# Patient Record
Sex: Female | Born: 1940 | Race: White | Hispanic: No | Marital: Married
Health system: Southern US, Community
[De-identification: ages and names within clinical notes are randomized; demographics above are authoritative.]

## PROBLEM LIST (undated history)

## (undated) DIAGNOSIS — I10 Essential (primary) hypertension: Secondary | ICD-10-CM

## (undated) DIAGNOSIS — G20 Parkinson's disease: Secondary | ICD-10-CM

## (undated) DIAGNOSIS — J449 Chronic obstructive pulmonary disease, unspecified: Secondary | ICD-10-CM

## (undated) DIAGNOSIS — E785 Hyperlipidemia, unspecified: Secondary | ICD-10-CM

## (undated) DIAGNOSIS — G20A1 Parkinson's disease without dyskinesia, without mention of fluctuations: Secondary | ICD-10-CM

## (undated) DIAGNOSIS — E119 Type 2 diabetes mellitus without complications: Secondary | ICD-10-CM

---

## 2021-09-20 ENCOUNTER — Emergency Department (HOSPITAL_COMMUNITY): Payer: Medicare Other

## 2021-09-20 ENCOUNTER — Observation Stay (HOSPITAL_COMMUNITY): Payer: Medicare Other

## 2021-09-20 ENCOUNTER — Inpatient Hospital Stay (HOSPITAL_COMMUNITY)
Admission: EM | Admit: 2021-09-20 | Discharge: 2021-09-23 | DRG: 917 | Payer: Medicare Other | Source: Skilled Nursing Facility | Attending: Internal Medicine | Admitting: Internal Medicine

## 2021-09-20 ENCOUNTER — Encounter (HOSPITAL_COMMUNITY): Payer: Self-pay | Admitting: Pharmacy Technician

## 2021-09-20 DIAGNOSIS — W19XXXD Unspecified fall, subsequent encounter: Secondary | ICD-10-CM | POA: Diagnosis present

## 2021-09-20 DIAGNOSIS — S92902S Unspecified fracture of left foot, sequela: Secondary | ICD-10-CM | POA: Diagnosis not present

## 2021-09-20 DIAGNOSIS — T428X1A Poisoning by antiparkinsonism drugs and other central muscle-tone depressants, accidental (unintentional), initial encounter: Principal | ICD-10-CM | POA: Diagnosis present

## 2021-09-20 DIAGNOSIS — J449 Chronic obstructive pulmonary disease, unspecified: Secondary | ICD-10-CM

## 2021-09-20 DIAGNOSIS — R400 Somnolence: Secondary | ICD-10-CM | POA: Diagnosis not present

## 2021-09-20 DIAGNOSIS — S92902D Unspecified fracture of left foot, subsequent encounter for fracture with routine healing: Secondary | ICD-10-CM

## 2021-09-20 DIAGNOSIS — I10 Essential (primary) hypertension: Secondary | ICD-10-CM | POA: Diagnosis not present

## 2021-09-20 DIAGNOSIS — I726 Aneurysm of vertebral artery: Secondary | ICD-10-CM | POA: Diagnosis not present

## 2021-09-20 DIAGNOSIS — G9340 Encephalopathy, unspecified: Secondary | ICD-10-CM | POA: Diagnosis present

## 2021-09-20 DIAGNOSIS — E119 Type 2 diabetes mellitus without complications: Secondary | ICD-10-CM

## 2021-09-20 DIAGNOSIS — G9341 Metabolic encephalopathy: Secondary | ICD-10-CM

## 2021-09-20 DIAGNOSIS — R4182 Altered mental status, unspecified: Secondary | ICD-10-CM | POA: Diagnosis not present

## 2021-09-20 DIAGNOSIS — F0280 Dementia in other diseases classified elsewhere without behavioral disturbance: Secondary | ICD-10-CM | POA: Diagnosis present

## 2021-09-20 DIAGNOSIS — R001 Bradycardia, unspecified: Secondary | ICD-10-CM | POA: Diagnosis present

## 2021-09-20 DIAGNOSIS — R8281 Pyuria: Secondary | ICD-10-CM | POA: Diagnosis present

## 2021-09-20 DIAGNOSIS — G928 Other toxic encephalopathy: Secondary | ICD-10-CM | POA: Diagnosis present

## 2021-09-20 DIAGNOSIS — G2581 Restless legs syndrome: Secondary | ICD-10-CM | POA: Diagnosis present

## 2021-09-20 DIAGNOSIS — E785 Hyperlipidemia, unspecified: Secondary | ICD-10-CM

## 2021-09-20 DIAGNOSIS — E876 Hypokalemia: Secondary | ICD-10-CM

## 2021-09-20 DIAGNOSIS — G20 Parkinson's disease: Secondary | ICD-10-CM | POA: Diagnosis present

## 2021-09-20 DIAGNOSIS — Z4659 Encounter for fitting and adjustment of other gastrointestinal appliance and device: Secondary | ICD-10-CM

## 2021-09-20 DIAGNOSIS — G20A1 Parkinson's disease without dyskinesia, without mention of fluctuations: Secondary | ICD-10-CM

## 2021-09-20 DIAGNOSIS — Z20822 Contact with and (suspected) exposure to covid-19: Secondary | ICD-10-CM | POA: Diagnosis present

## 2021-09-20 DIAGNOSIS — Z91013 Allergy to seafood: Secondary | ICD-10-CM

## 2021-09-20 DIAGNOSIS — Y92099 Unspecified place in other non-institutional residence as the place of occurrence of the external cause: Secondary | ICD-10-CM

## 2021-09-20 HISTORY — DX: Parkinson's disease: G20

## 2021-09-20 HISTORY — DX: Essential (primary) hypertension: I10

## 2021-09-20 HISTORY — DX: Chronic obstructive pulmonary disease, unspecified: J44.9

## 2021-09-20 HISTORY — DX: Parkinson's disease without dyskinesia, without mention of fluctuations: G20.A1

## 2021-09-20 HISTORY — DX: Type 2 diabetes mellitus without complications: E11.9

## 2021-09-20 HISTORY — DX: Hyperlipidemia, unspecified: E78.5

## 2021-09-20 LAB — CBC WITH DIFFERENTIAL/PLATELET
Abs Immature Granulocytes: 0.03 10*3/uL (ref 0.00–0.07)
Basophils Absolute: 0 10*3/uL (ref 0.0–0.1)
Basophils Relative: 0 %
Eosinophils Absolute: 0.1 10*3/uL (ref 0.0–0.5)
Eosinophils Relative: 1 %
HCT: 36.3 % (ref 36.0–46.0)
Hemoglobin: 12.8 g/dL (ref 12.0–15.0)
Immature Granulocytes: 1 %
Lymphocytes Relative: 34 %
Lymphs Abs: 2 10*3/uL (ref 0.7–4.0)
MCH: 32.7 pg (ref 26.0–34.0)
MCHC: 35.3 g/dL (ref 30.0–36.0)
MCV: 92.6 fL (ref 80.0–100.0)
Monocytes Absolute: 0.6 10*3/uL (ref 0.1–1.0)
Monocytes Relative: 9 %
Neutro Abs: 3.2 10*3/uL (ref 1.7–7.7)
Neutrophils Relative %: 55 %
Platelets: 235 10*3/uL (ref 150–400)
RBC: 3.92 MIL/uL (ref 3.87–5.11)
RDW: 12.8 % (ref 11.5–15.5)
WBC: 5.9 10*3/uL (ref 4.0–10.5)
nRBC: 0 % (ref 0.0–0.2)

## 2021-09-20 LAB — URINALYSIS, ROUTINE W REFLEX MICROSCOPIC
Bacteria, UA: NONE SEEN
Bilirubin Urine: NEGATIVE
Glucose, UA: NEGATIVE mg/dL
Ketones, ur: NEGATIVE mg/dL
Leukocytes,Ua: NEGATIVE
Nitrite: NEGATIVE
Protein, ur: NEGATIVE mg/dL
Specific Gravity, Urine: 1.014 (ref 1.005–1.030)
pH: 8 (ref 5.0–8.0)

## 2021-09-20 LAB — COMPREHENSIVE METABOLIC PANEL
ALT: 7 U/L (ref 0–44)
AST: 21 U/L (ref 15–41)
Albumin: 3.5 g/dL (ref 3.5–5.0)
Alkaline Phosphatase: 72 U/L (ref 38–126)
Anion gap: 9 (ref 5–15)
BUN: 5 mg/dL — ABNORMAL LOW (ref 8–23)
CO2: 33 mmol/L — ABNORMAL HIGH (ref 22–32)
Calcium: 9.9 mg/dL (ref 8.9–10.3)
Chloride: 100 mmol/L (ref 98–111)
Creatinine, Ser: 0.74 mg/dL (ref 0.44–1.00)
GFR, Estimated: >60 mL/min (ref >60)
Glucose, Bld: 115 mg/dL — ABNORMAL HIGH (ref 70–99)
Potassium: 2.8 mmol/L — ABNORMAL LOW (ref 3.5–5.1)
Sodium: 142 mmol/L (ref 135–145)
Total Bilirubin: 0.6 mg/dL (ref 0.3–1.2)
Total Protein: 6.4 g/dL — ABNORMAL LOW (ref 6.5–8.1)

## 2021-09-20 LAB — I-STAT VENOUS BLOOD GAS, ED
Acid-Base Excess: 9 mmol/L — ABNORMAL HIGH (ref 0.0–2.0)
Bicarbonate: 31.7 mmol/L — ABNORMAL HIGH (ref 20.0–28.0)
Calcium, Ion: 1.15 mmol/L (ref 1.15–1.40)
HCT: 30 % — ABNORMAL LOW (ref 36.0–46.0)
Hemoglobin: 10.2 g/dL — ABNORMAL LOW (ref 12.0–15.0)
O2 Saturation: 78 %
Potassium: 3 mmol/L — ABNORMAL LOW (ref 3.5–5.1)
Sodium: 141 mmol/L (ref 135–145)
TCO2: 33 mmol/L — ABNORMAL HIGH (ref 22–32)
pCO2, Ven: 35.4 mmHg — ABNORMAL LOW (ref 44–60)
pH, Ven: 7.56 — ABNORMAL HIGH (ref 7.25–7.43)
pO2, Ven: 36 mmHg (ref 32–45)

## 2021-09-20 LAB — LACTIC ACID, PLASMA
Lactic Acid, Venous: 1.9 mmol/L (ref 0.5–1.9)
Lactic Acid, Venous: 1.7 mmol/L (ref 0.5–1.9)

## 2021-09-20 LAB — AMMONIA: Ammonia: 35 umol/L (ref 9–35)

## 2021-09-20 LAB — PROTIME-INR
INR: 1.1 (ref 0.8–1.2)
Prothrombin Time: 14.3 seconds (ref 11.4–15.2)

## 2021-09-20 LAB — MAGNESIUM: Magnesium: 1.5 mg/dL — ABNORMAL LOW (ref 1.7–2.4)

## 2021-09-20 LAB — TSH: TSH: 3.015 u[IU]/mL (ref 0.350–4.500)

## 2021-09-20 LAB — VITAMIN B12: Vitamin B-12: 1392 pg/mL — ABNORMAL HIGH (ref 180–914)

## 2021-09-20 IMAGING — MR MR MRA NECK WO/W CM
2 of 3 series · 18 of 48 positions shown · non-contrast
Comparison: CT head from the same day.

CLINICAL DATA: Mental status change, unknown cause; Vertigo,
central; Neuro deficit, acute, stroke suspected



[((date))-((date)) · coronal · 1.2mm · 0.59mm/px · 10 of 105 slices shown (1 of 2)]
[im 1/105]
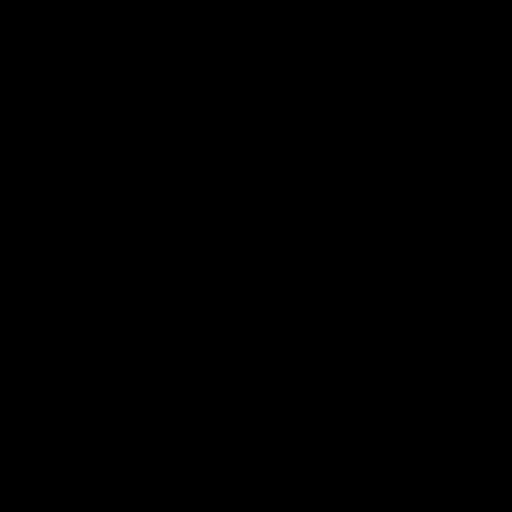
[im 9/105]
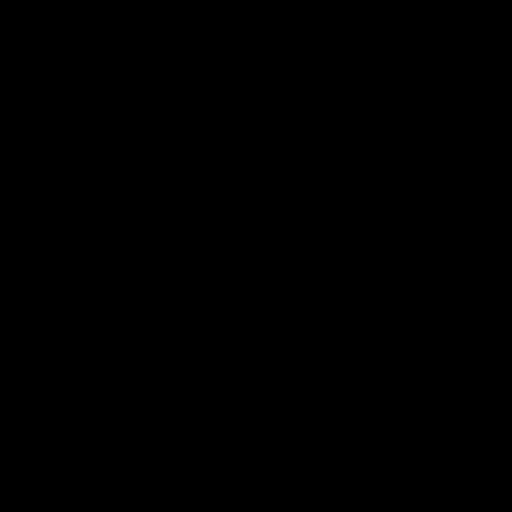
[im 17/105]
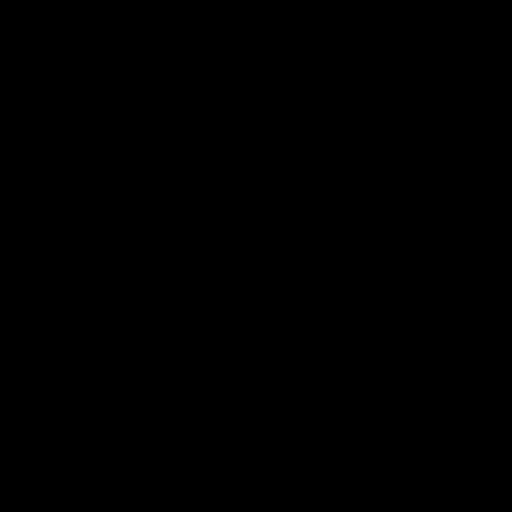
[im 25/105]
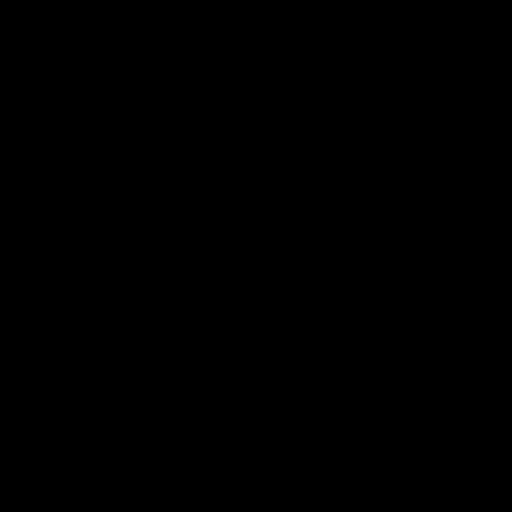
[im 33/105]
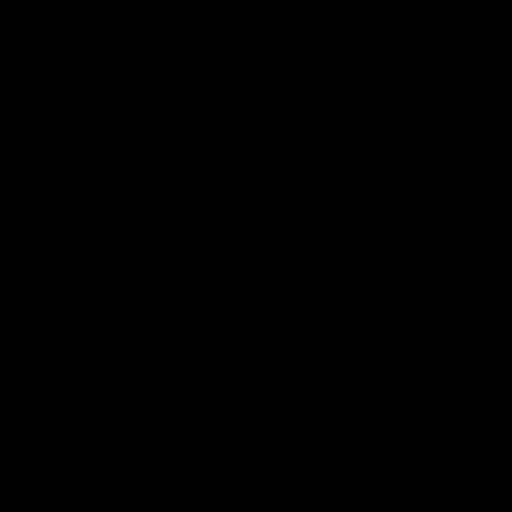
[im 49/105]
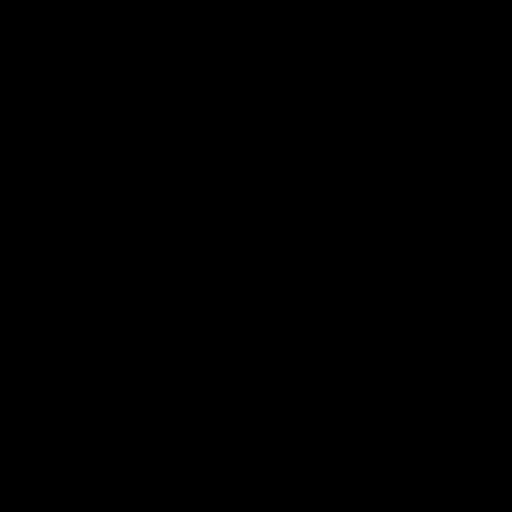
[im 57/105]
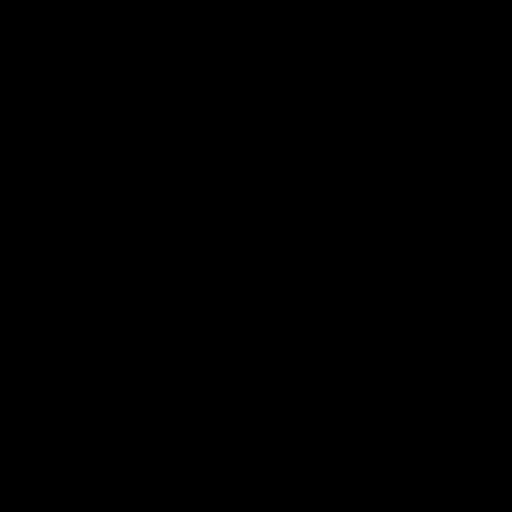
[im 73/105]
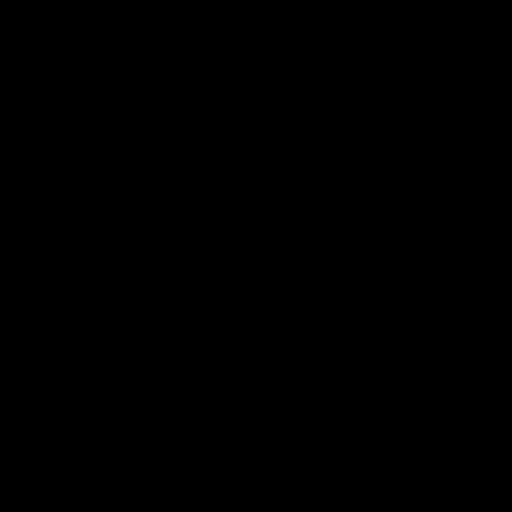
[im 89/105]
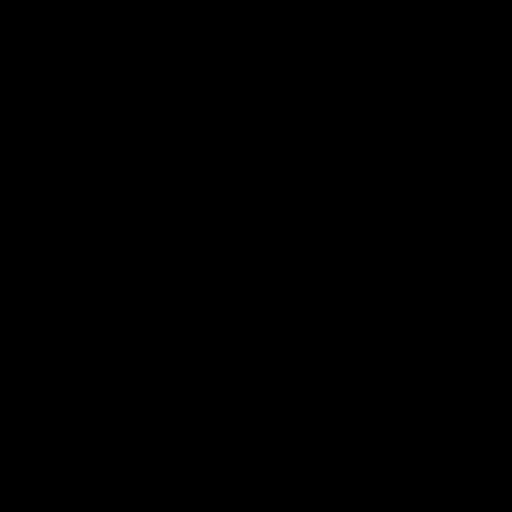
[im 105/105]
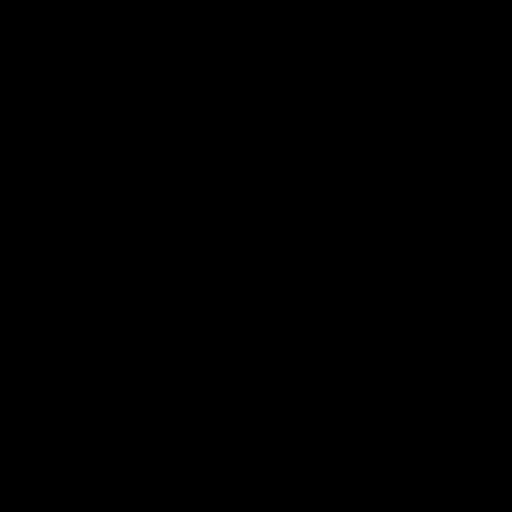

[((date))-((date)) · coronal · 1.2mm · 0.59mm/px · 8 of 105 slices shown (2 of 2)]
[im 1/105]
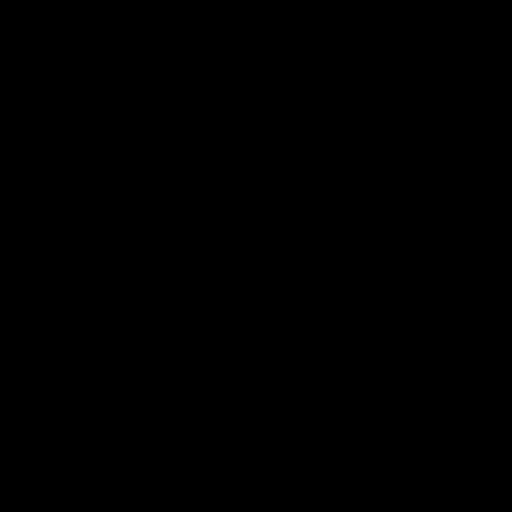
[im 17/105]
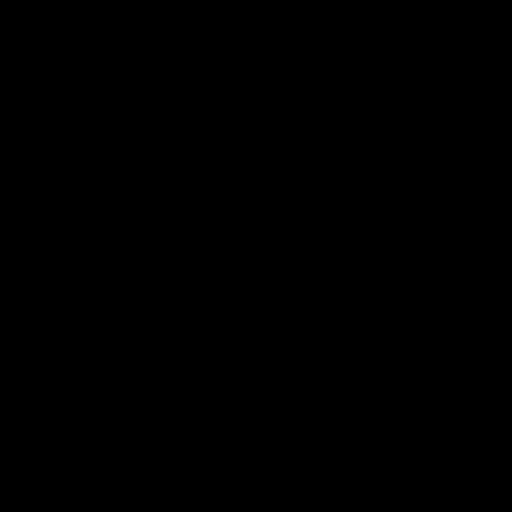
[im 33/105]
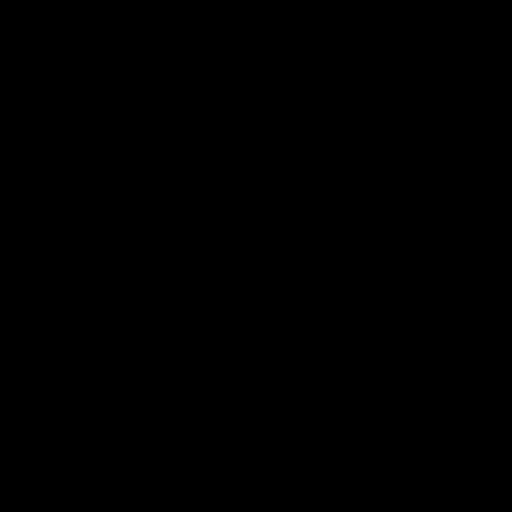
[im 49/105]
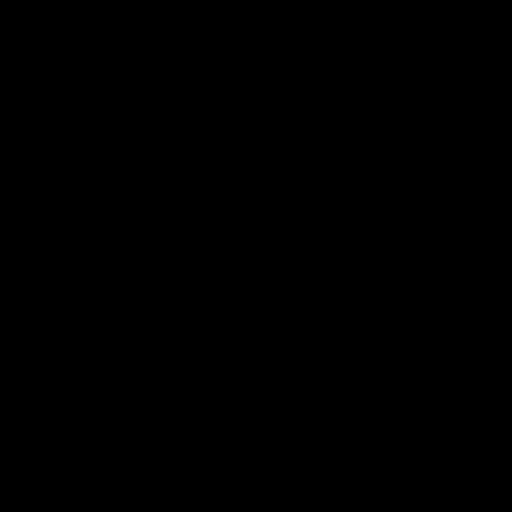
[im 57/105]
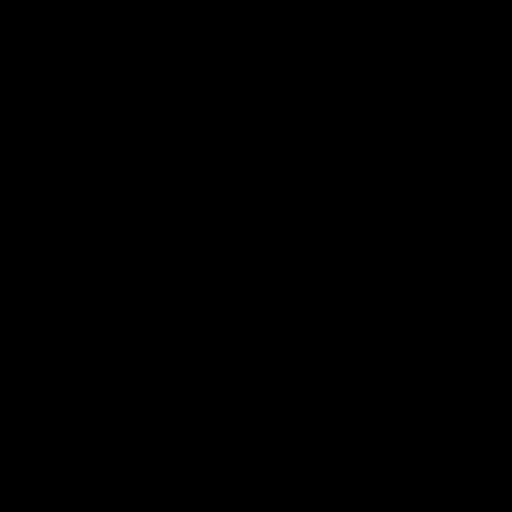
[im 73/105]
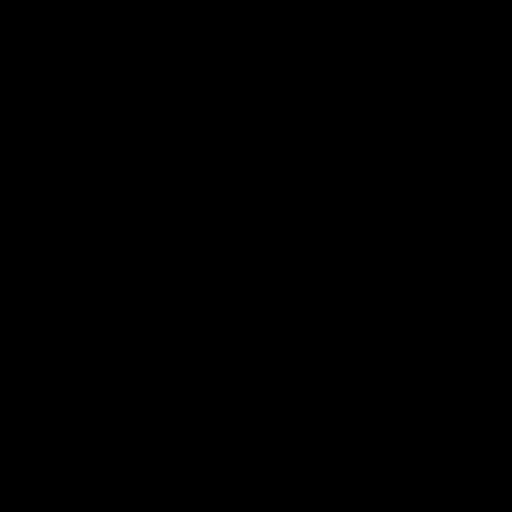
[im 89/105]
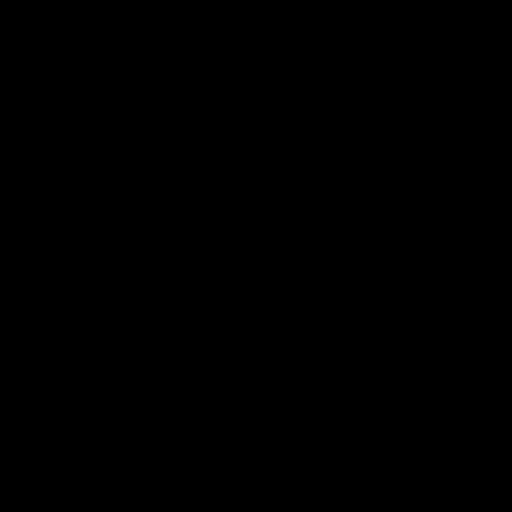
[im 105/105]
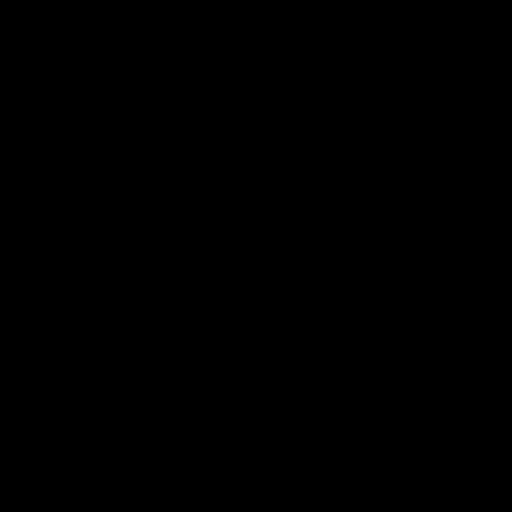

[18 of 48 positions shown; findings below may reference images not displayed]

FINDINGS: MRI HEAD FINDINGS

Brain: No acute infarction, hemorrhage, hydrocephalus, extra-axial
collection or intraparenchymal mass lesion. Mild to moderate
scattered T2/FLAIR hyperintensities in the white matter, nonspecific
but compatible with chronic microvascular ischemic disease. Mildly
T1 hyperintense small midline structure in the posterior
nasopharynx, probably an incidental complex Tornwaldt cyst.

Vascular: Detailed below.

Skull and upper cervical spine: Normal marrow signal.

Sinuses/Orbits: Minimal paranasal sinus mucosal thickening.
Unremarkable orbits.

Other: Trace right mastoid effusion.

MRA HEAD FINDINGS

Anterior circulation: Bilateral intracranial ICAs, MCAs, and ACAs
are patent without proximal hemodynamically significant stenosis. No
aneurysm identified.

Posterior circulation: Bilateral intradural vertebral arteries,
basilar artery and posterior cerebral arteries are patent without
proximal hemodynamically significant stenosis. Small left P1 PCA
with prominent right posterior communicating artery, anatomic
variant. Bilateral PICAs and superior cerebellar arteries are patent
proximally. No aneurysm identified.

MRA NECK FINDINGS

Motion limited evaluation, particularly proximally and at the
carotid bifurcations. Within this limitation:

Aorta: Patent great vessel origins.

Carotid system: Retropharyngeal course of the common carotid
arteries bilaterally. Bilateral common carotid arteries and internal
carotid arteries are patent without evidence of high-grade stenosis.
Limited assessment for stenosis at the carotid bifurcations due to
motion.

Vertebral arteries: Nondiagnostic evaluation of the vertebral artery
origins due to artifact. The visualized vertebral arteries are
patent without evidence of high-grade stenosis. Approximately 5 x 3
mm outpouching arising from the right V2 vertebral artery.
IMPRESSION: MRI:

1. No evidence of acute intracranial abnormality.
2. Mild-to-moderate chronic microvascular ischemic disease.

MRA head:

No large vessel occlusion or proximal hemodynamically significant
stenosis.

MRA neck:

1. Motion limited study without evidence of occlusion or visible
high-grade stenosis.
2. Approximately 5 x 3 mm outpouching arising from the right V2
vertebral artery, suspicious for aneurysm but not well characterized
by motion limited MRA. A CTA of the neck could better characterize.

## 2021-09-20 IMAGING — CR DG CHEST 2V
2 series · 2 of 2 positions shown · non-contrast
Comparison: None.

CLINICAL DATA: Sepsis.

EXAM:
CHEST - 2 VIEW

[chest lat]
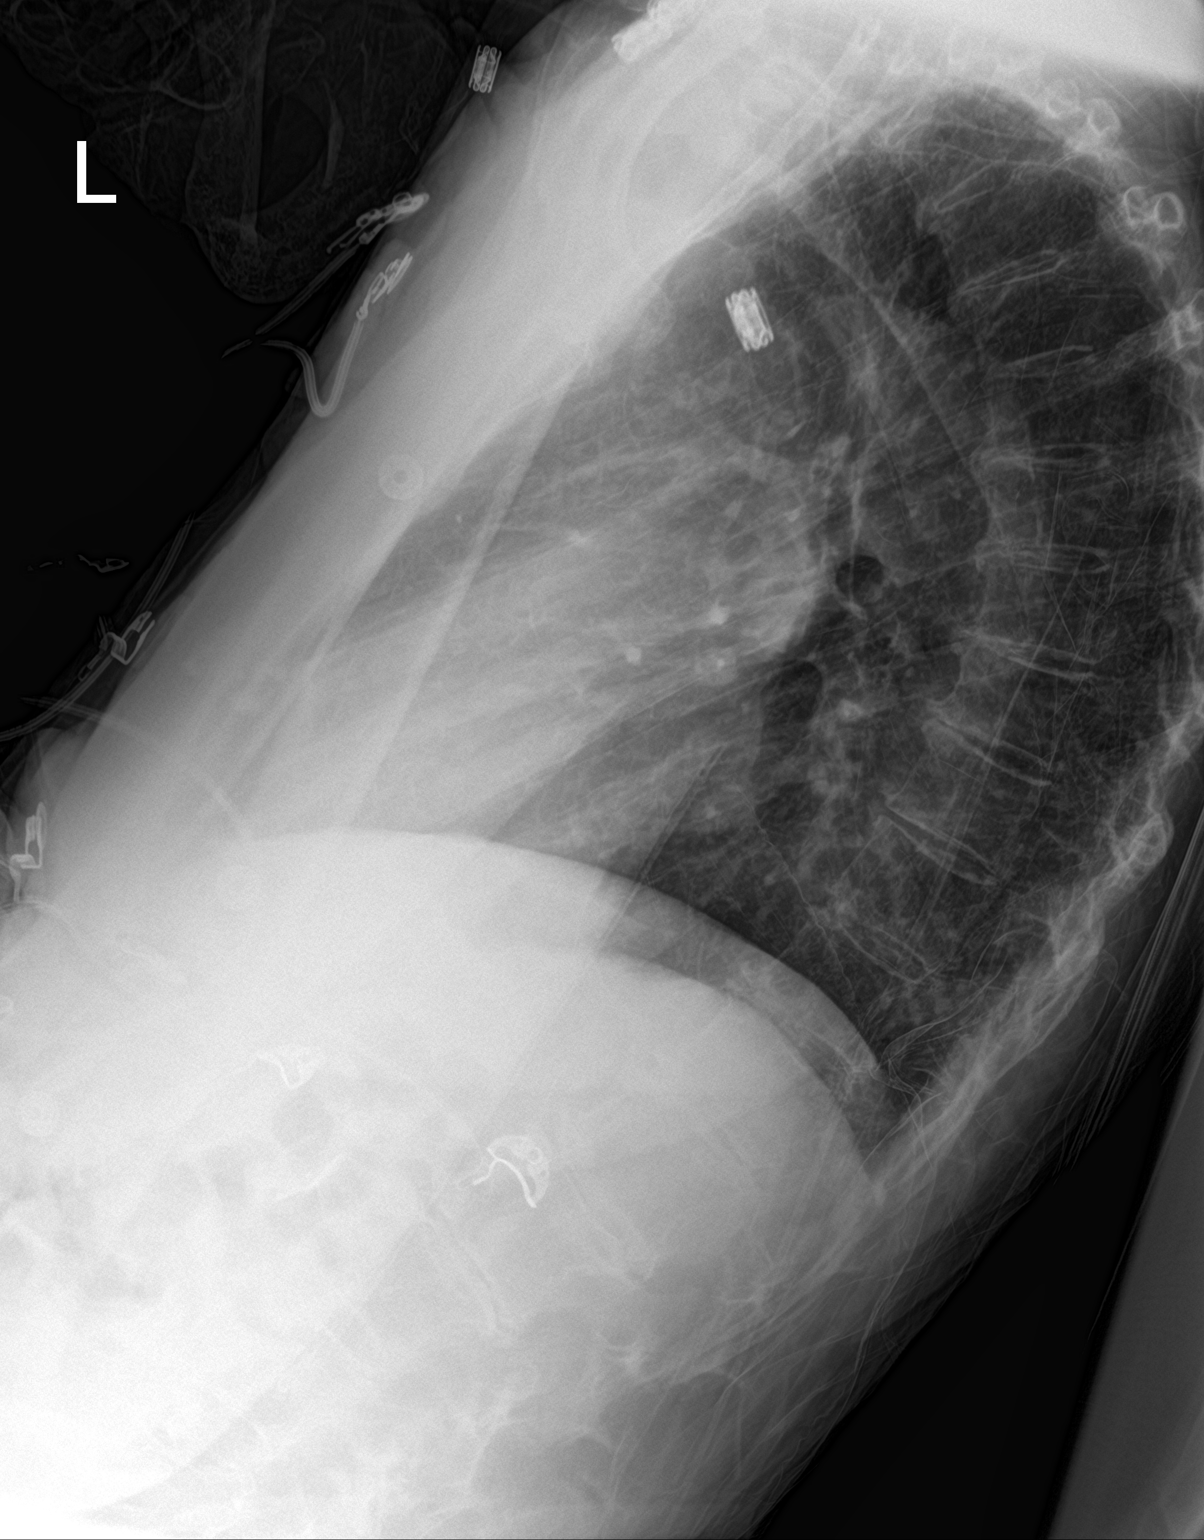

[chest ap]
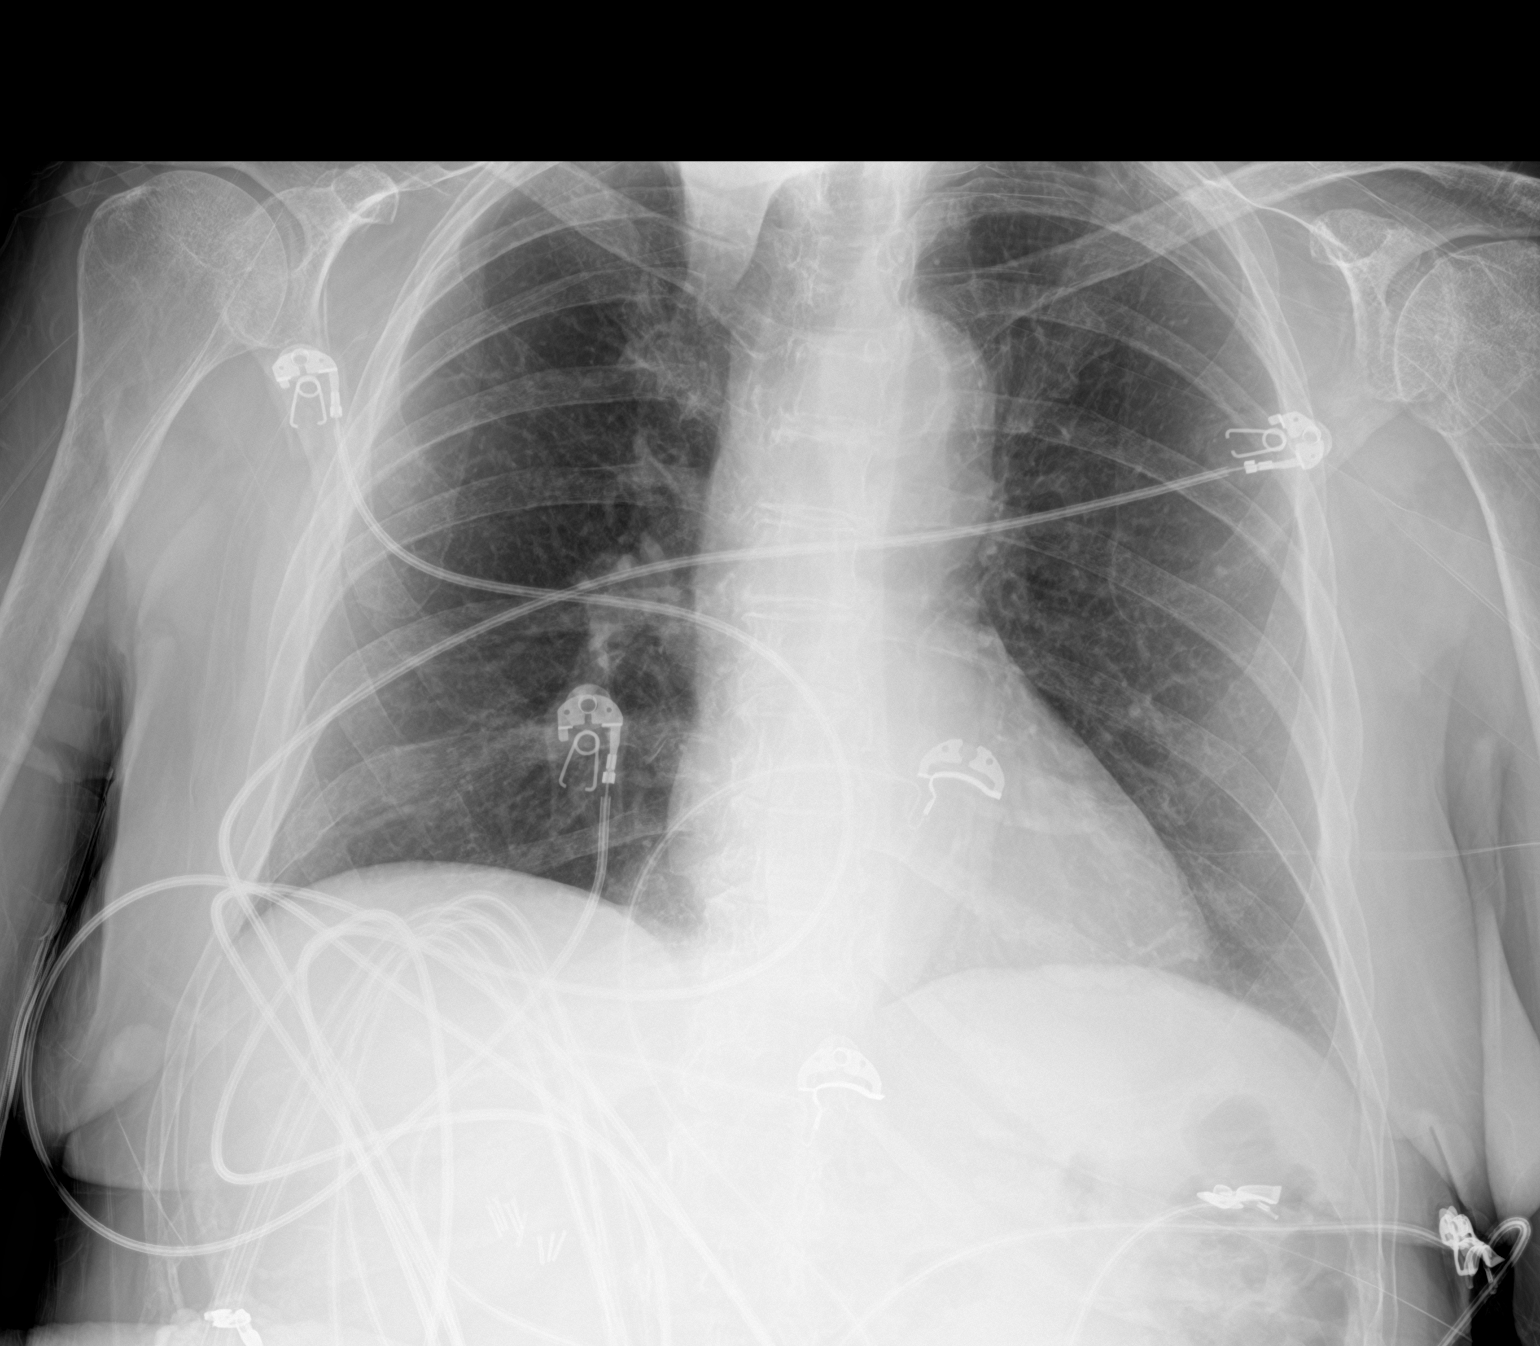

[2 of 2 positions shown; findings below may reference images not displayed]

FINDINGS: The heart size and mediastinal contours are within normal limits.
Both lungs are clear. No pleural effusion or edema.
Age-indeterminate left lateral sixth and seventh rib fractures.
IMPRESSION: 1. No acute cardiopulmonary abnormalities.
2. Age-indeterminate left lateral sixth and seventh rib fractures.

## 2021-09-20 IMAGING — CT CT ANGIO NECK
1 of 7 series · 7 of 33 positions shown · non-contrast
Comparison: No prior CTA, correlation is made with MRA neck
[DATE]

CLINICAL DATA: Abnormality seen on MRA possible aneurysm



[Series 8: cta neck axial · axial · 0.42mm/px · z∈[+936,+1102]mm · 7 of 248 slices shown]
[im 31/248  soft-tissue]
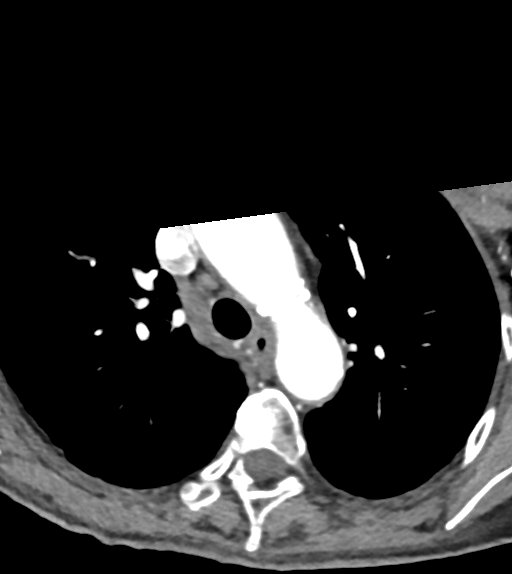
[im 62/248  bone]
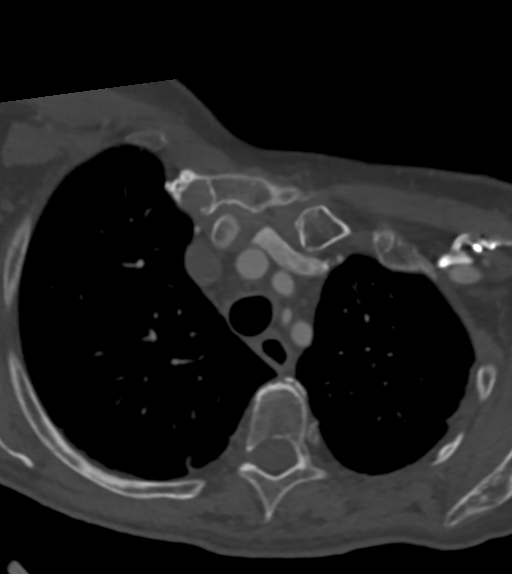
[im 93/248  soft-tissue]
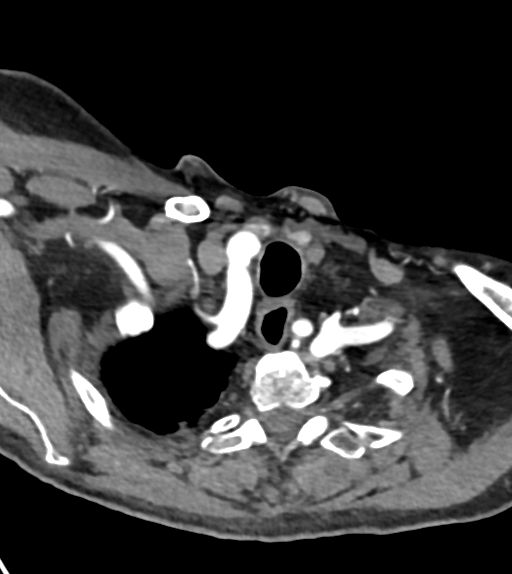
[im 124/248  bone]
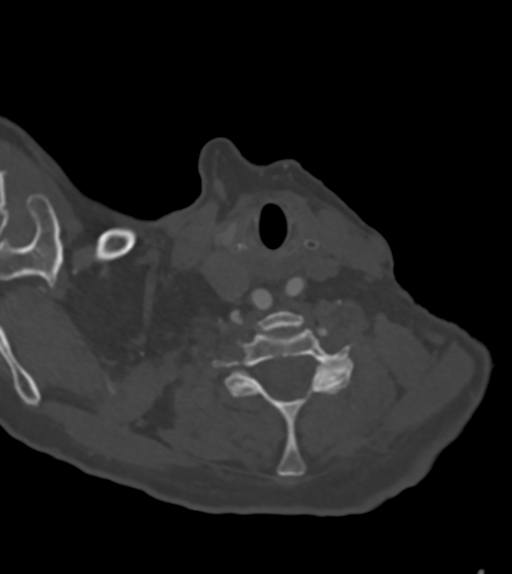
[im 155/248  soft-tissue]
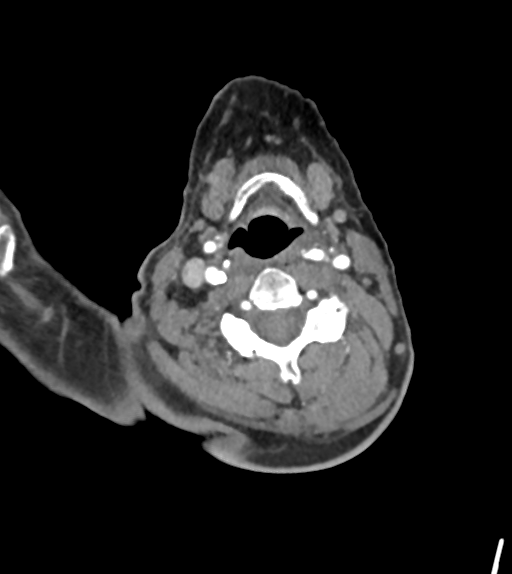
[im 186/248  bone]
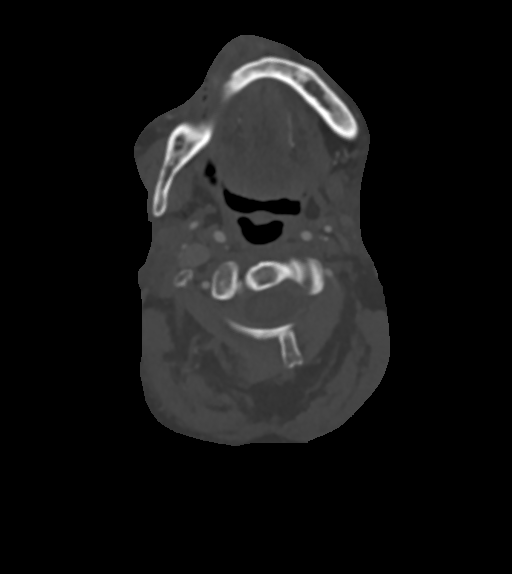
[im 217/248  soft-tissue]
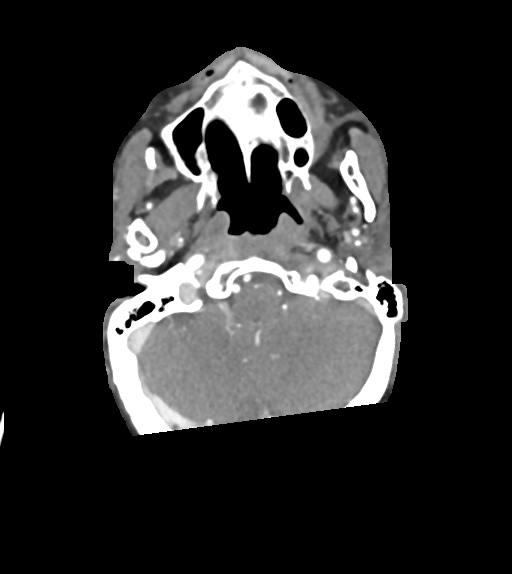

[7 of 33 positions shown; findings below may reference images not displayed]

RADIATION DOSE REDUCTION: This exam was performed according to the
departmental dose-optimization program which includes automated
exposure control, adjustment of the mA and/or kV according to
patient size and/or use of iterative reconstruction technique.

CONTRAST:  65mL OMNIPAQUE IOHEXOL 350 MG/ML SOLN
FINDINGS: Aortic arch: 4 vessel arch, with the origin of the left vertebral
artery from the aorta. Aortic atherosclerosis. Imaged portion shows
no evidence of aneurysm or dissection. No significant stenosis of
the major arch vessel origins.

Right carotid system: No evidence of dissection, stenosis (50% or
greater) or occlusion. Retropharyngeal course of the right CCA.
Plaque at the bifurcation and in the proximal right ICA is not
hemodynamically significant.

Left carotid system: No evidence of dissection, stenosis (50% or
greater) or occlusion. Retropharyngeal course of the left CCA.
Plaque at the bifurcation and in the proximal left ICA is not
hemodynamically significant.

Vertebral arteries: The left vertebral artery is unremarkable from
its origin to the vertebrobasilar junction. The right V1 segment is
quite tortuous. Fusiform dilatation of the right V2 segment at the
level of C4 (series 8, image 101), measuring up to 5 mm compared to
3 mm proximally and 3 mm distally. The aneurysmal segment extends
approximately 5 mm. The right vertebral artery is otherwise
unremarkable.

Skeleton: No acute osseous abnormality. Degenerative changes in the
right-greater-than-left TMJ. Degenerative changes in the cervical
spine and imaged thoracic spine.

Other neck: No acute finding.

Upper chest: No focal pulmonary opacity or pleural effusion.
IMPRESSION: 1. Fusiform dilatation of the right V2, measuring up to 5 mm
extending for approximately 5 mm at the level of C4. The right
vertebral artery is otherwise unremarkable.
2. No hemodynamically significant stenosis in the neck.

## 2021-09-20 IMAGING — CT CT HEAD W/O CM
4 series · 17 of 47 positions shown, 19 images · non-contrast
Comparison: None.

CLINICAL DATA: Altered mental status. History of Parkinson's and
dementia.



[Series 3: head without · axial · non-contrast · 0.37mm/px · z∈[-135,-15]mm · 7 of 33 slices shown, 9 images]
[im 5/33  brain]
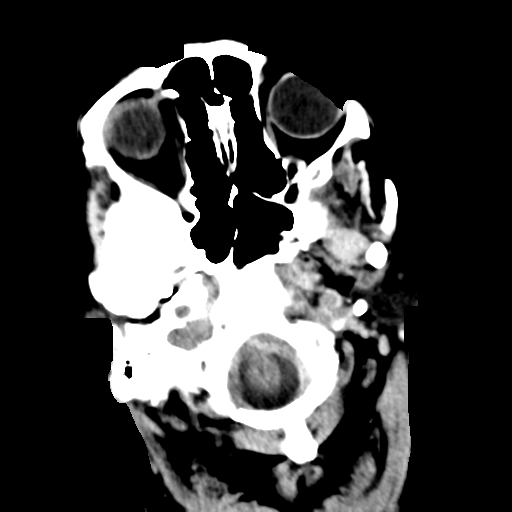
[im 5/33  bone]
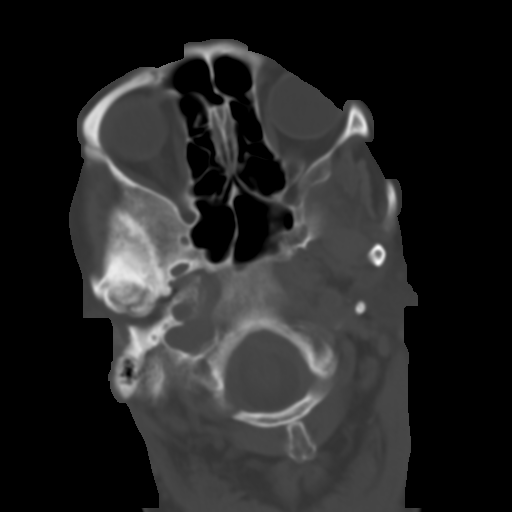
[im 9/33  brain]
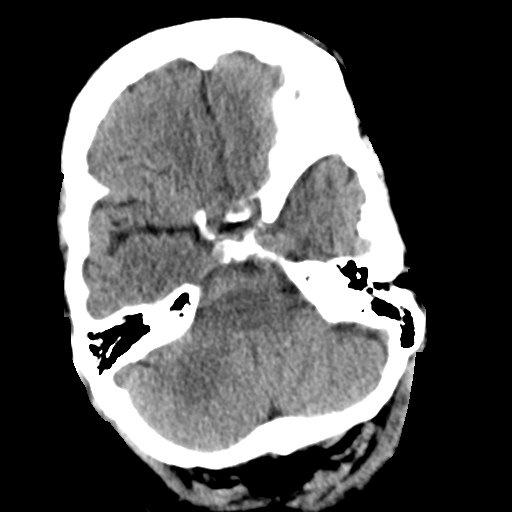
[im 13/33  brain]
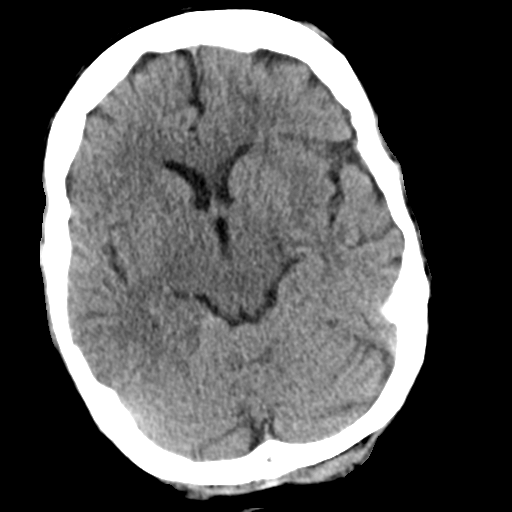
[im 17/33  brain]
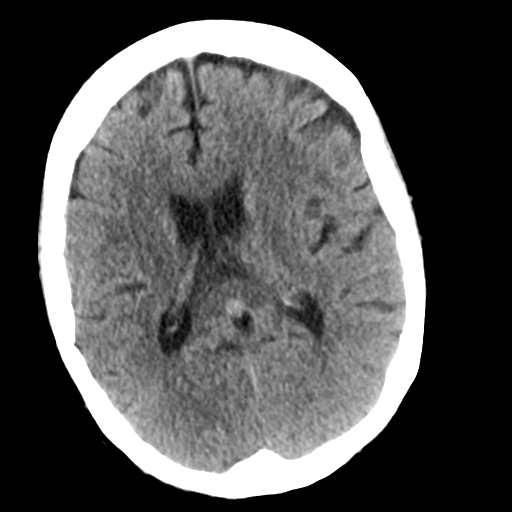
[im 21/33  brain]
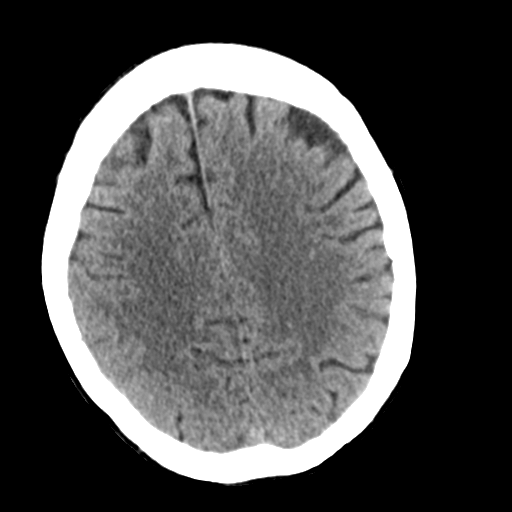
[im 21/33  bone]
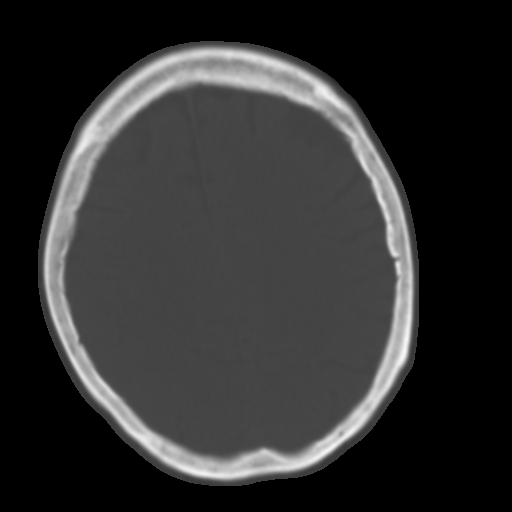
[im 25/33  brain]
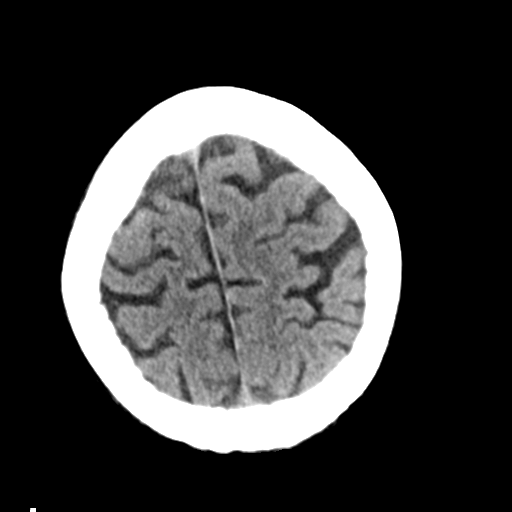
[im 29/33  brain]
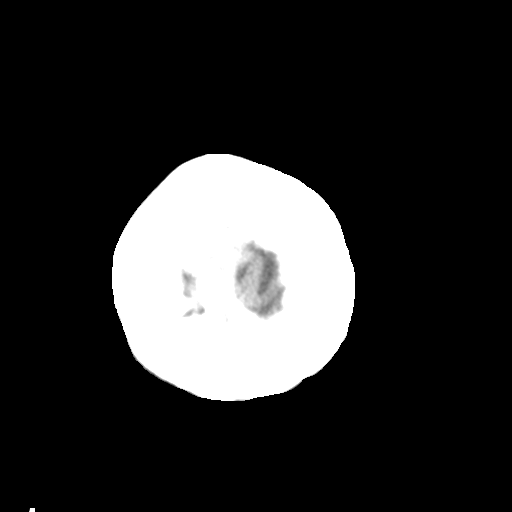

[Series 4: head bone · axial · 0.37mm/px · z∈[-139,-83]mm · 4 of 82 slices shown]
[im 9/82  bone]
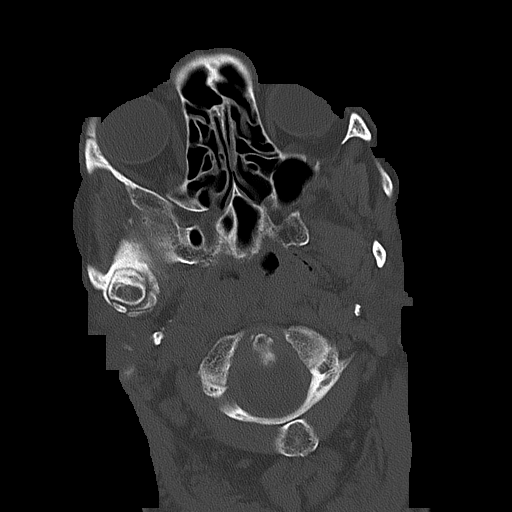
[im 17/82  bone]
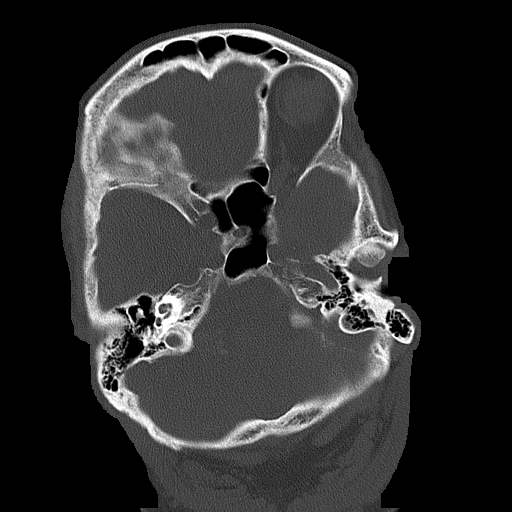
[im 25/82  bone]
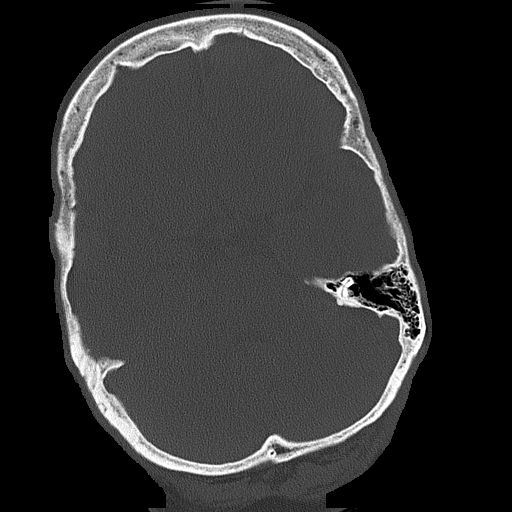
[im 37/82  bone]
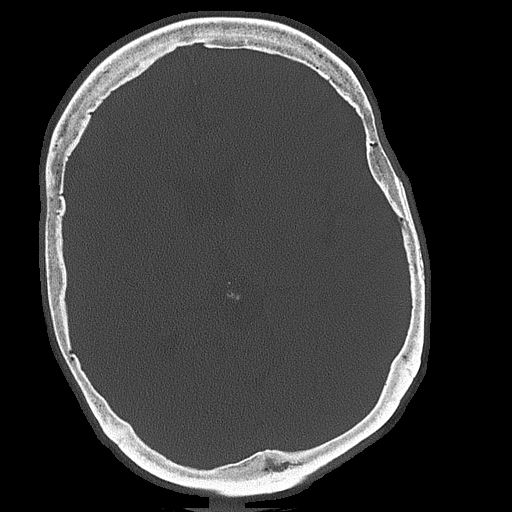

[Series 5: head without cor · coronal · non-contrast · 0.32mm/px · 3 of 71 slices shown]
[im 24/71  brain]
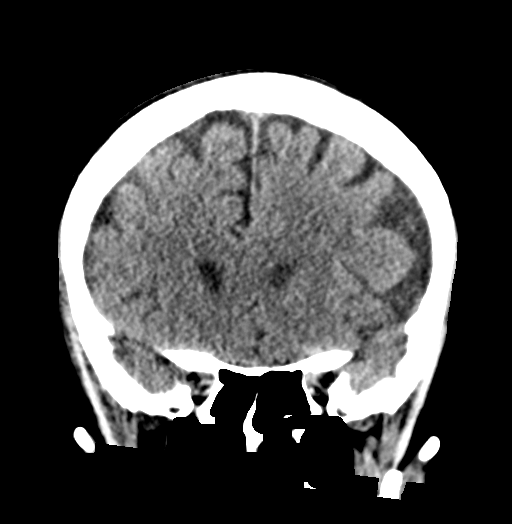
[im 32/71  brain]
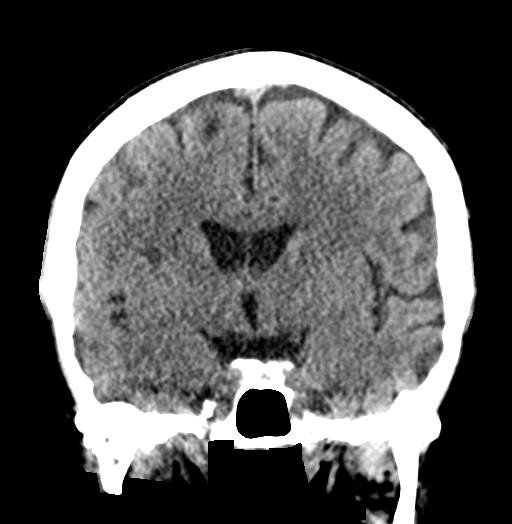
[im 39/71  brain]
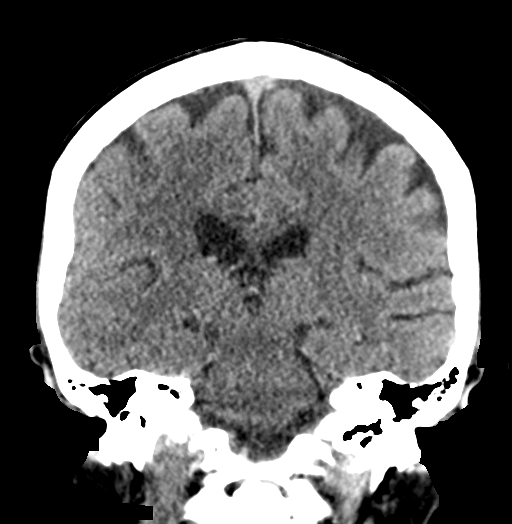

[Series 6: head without sag · sagittal · non-contrast · 0.36mm/px · 3 of 53 slices shown]
[im 18/53  brain]
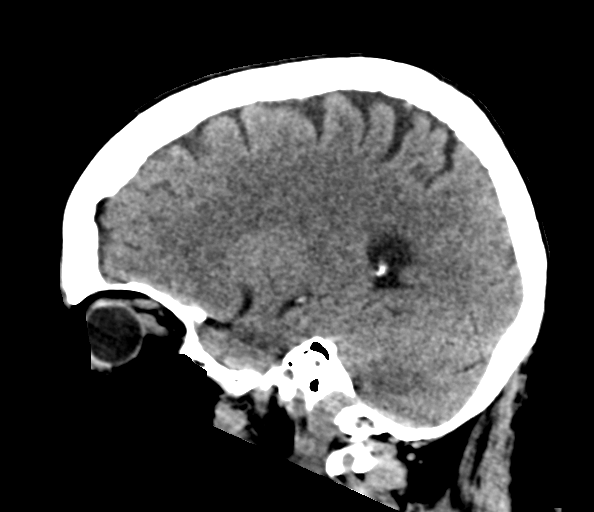
[im 27/53  brain]
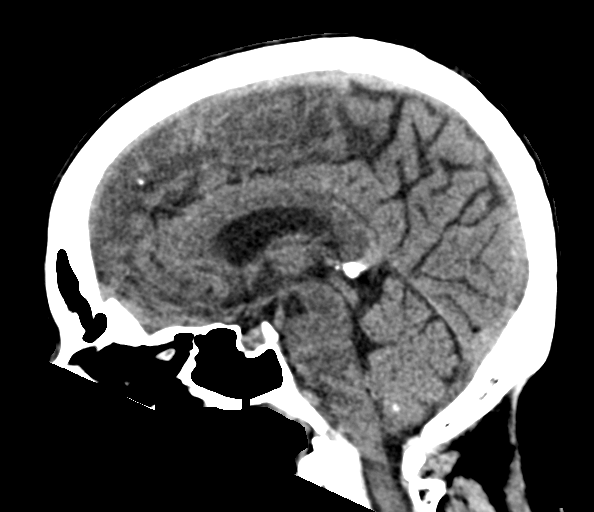
[im 35/53  brain]
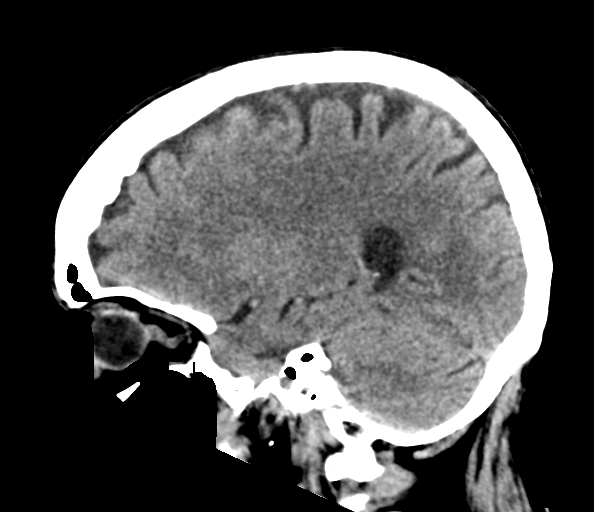

[17 of 47 positions shown; findings below may reference images not displayed]

FINDINGS: Brain: No evidence of acute infarction, hemorrhage, hydrocephalus,
extra-axial collection or mass lesion/mass effect.

Vascular: No hyperdense vessel or unexpected calcification.

Skull: Normal. Negative for fracture or focal lesion. Severe right
TMJ osteoarthritis.

Sinuses/Orbits: No acute finding.

Other: None.
IMPRESSION: 1. No acute intracranial abnormality.

## 2021-09-20 IMAGING — MR MR MRA HEAD W/O CM
1 series · 18 of 48 positions shown · non-contrast
Comparison: CT head from the same day.

CLINICAL DATA: Mental status change, unknown cause; Vertigo,
central; Neuro deficit, acute, stroke suspected



[Series 3: ax (id) · axial · 1.0mm · 0.43mm/px · z∈[-82,+0]mm · 18 of 176 slices shown]
[im 1/176]
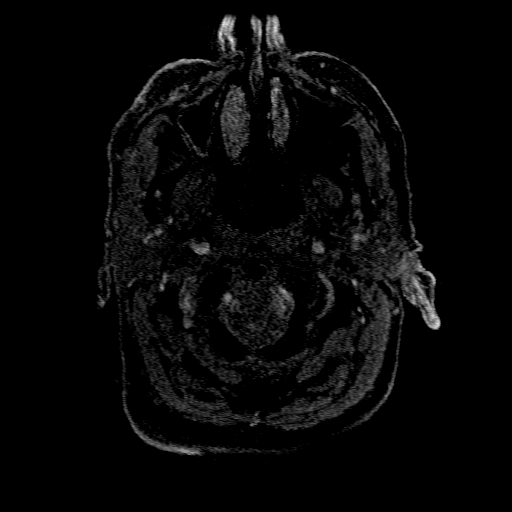
[im 4/176]
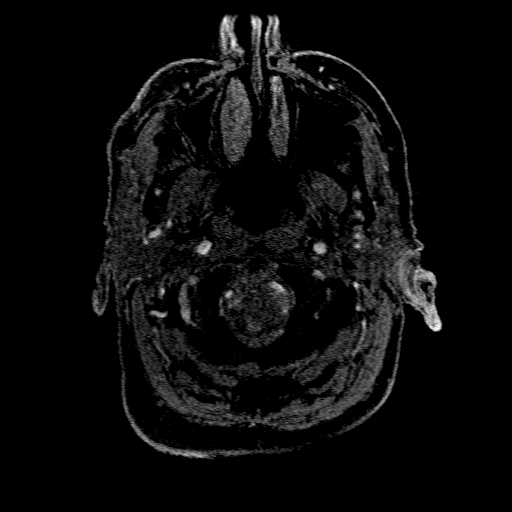
[im 8/176]
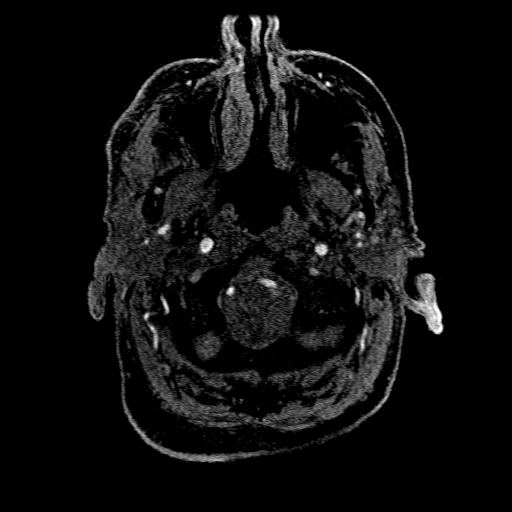
[im 12/176]
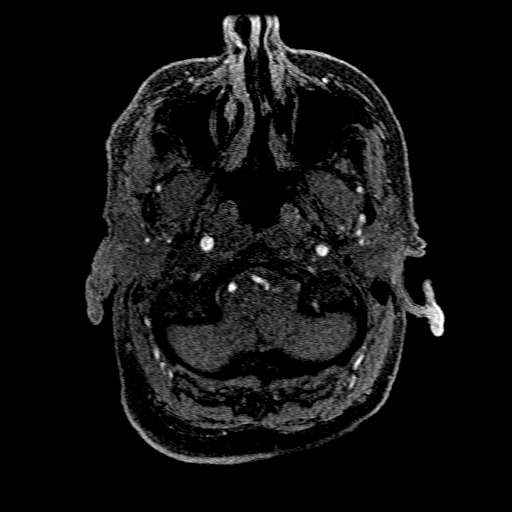
[im 15/176]
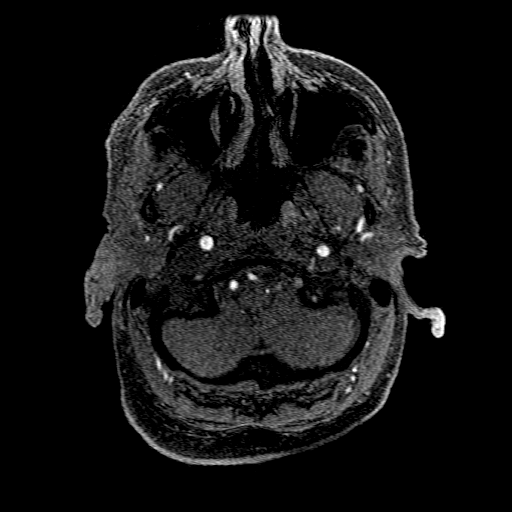
[im 19/176]
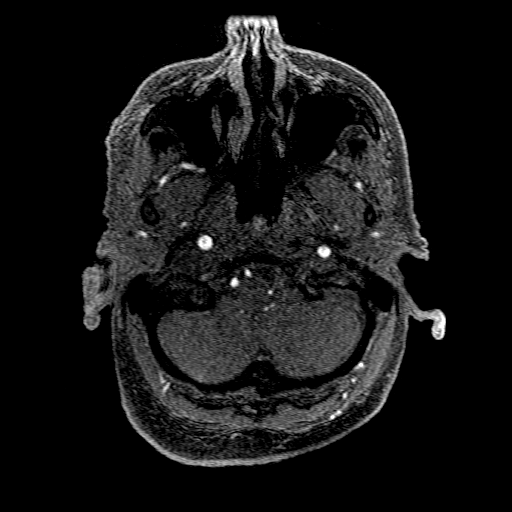
[im 23/176]
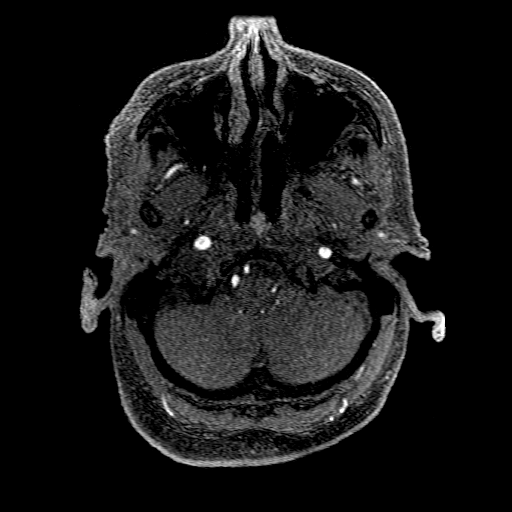
[im 27/176]
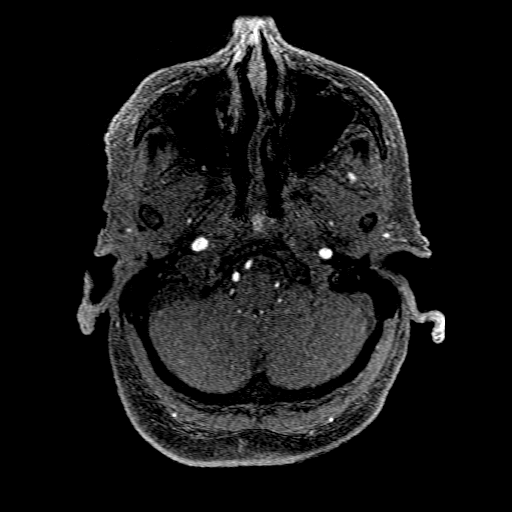
[im 30/176]
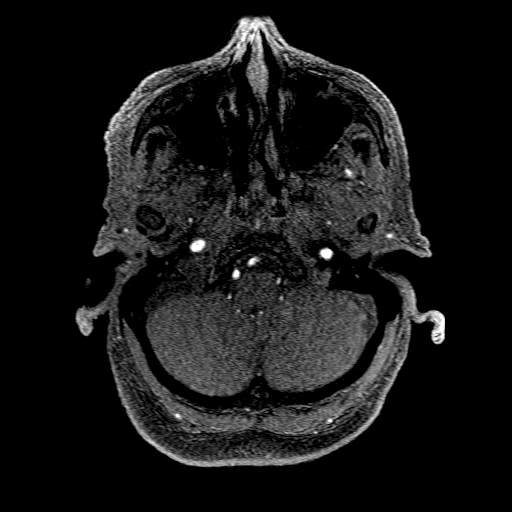
[im 34/176]
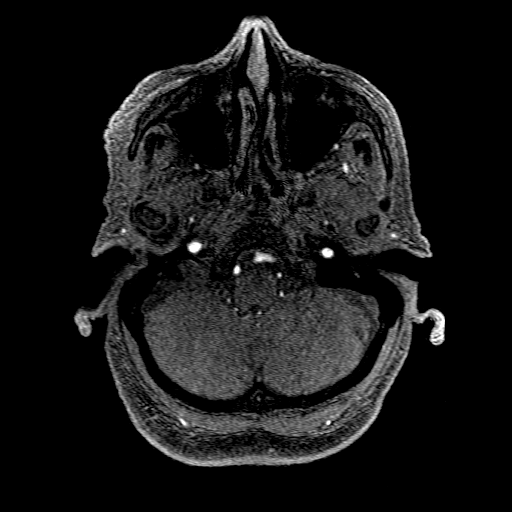
[im 56/176]
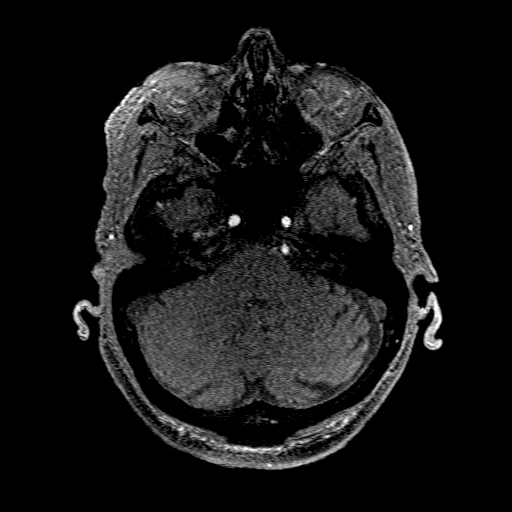
[im 79/176]
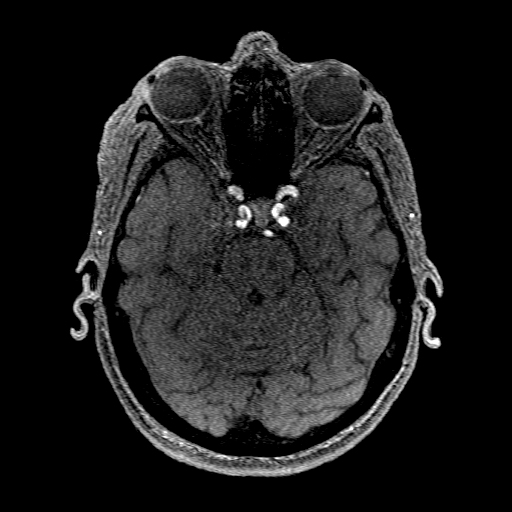
[im 90/176]
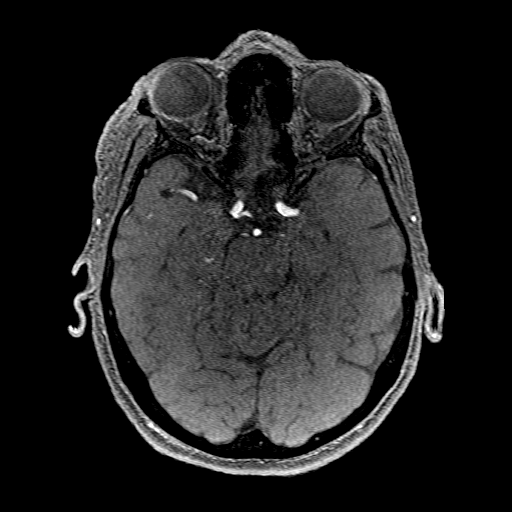
[im 101/176]
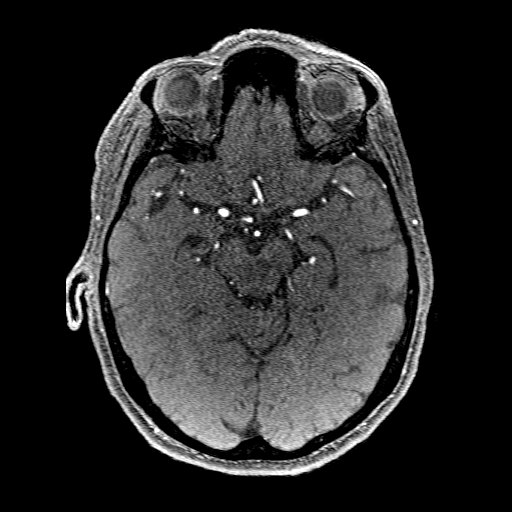
[im 123/176]
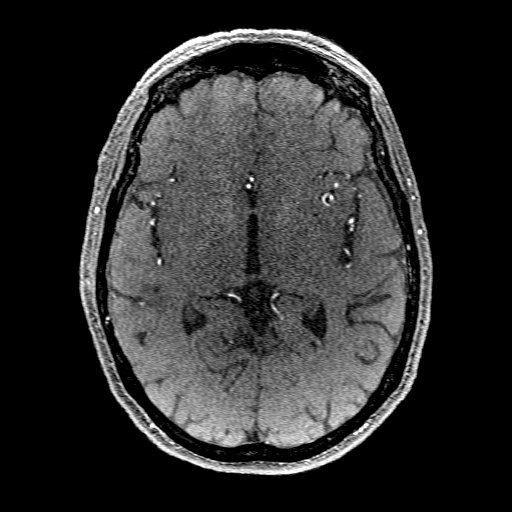
[im 146/176]
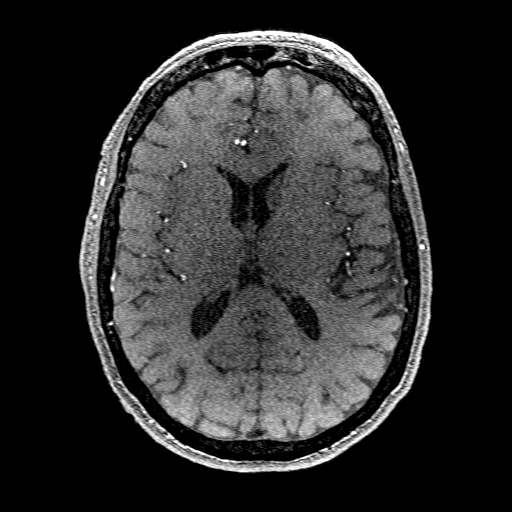
[im 149/176]
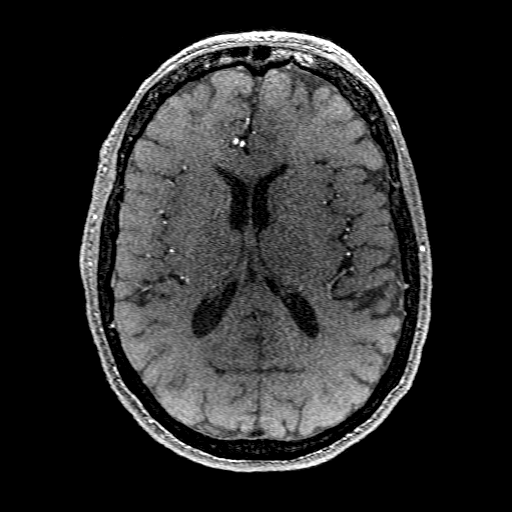
[im 168/176]
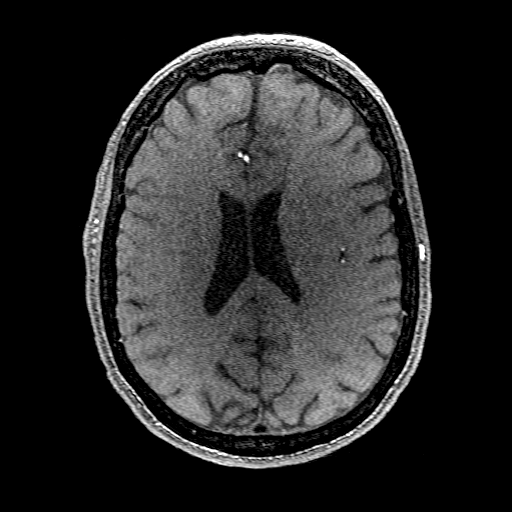

[18 of 48 positions shown; findings below may reference images not displayed]

FINDINGS: MRI HEAD FINDINGS

Brain: No acute infarction, hemorrhage, hydrocephalus, extra-axial
collection or intraparenchymal mass lesion. Mild to moderate
scattered T2/FLAIR hyperintensities in the white matter, nonspecific
but compatible with chronic microvascular ischemic disease. Mildly
T1 hyperintense small midline structure in the posterior
nasopharynx, probably an incidental complex Tornwaldt cyst.

Vascular: Detailed below.

Skull and upper cervical spine: Normal marrow signal.

Sinuses/Orbits: Minimal paranasal sinus mucosal thickening.
Unremarkable orbits.

Other: Trace right mastoid effusion.

MRA HEAD FINDINGS

Anterior circulation: Bilateral intracranial ICAs, MCAs, and ACAs
are patent without proximal hemodynamically significant stenosis. No
aneurysm identified.

Posterior circulation: Bilateral intradural vertebral arteries,
basilar artery and posterior cerebral arteries are patent without
proximal hemodynamically significant stenosis. Small left P1 PCA
with prominent right posterior communicating artery, anatomic
variant. Bilateral PICAs and superior cerebellar arteries are patent
proximally. No aneurysm identified.

MRA NECK FINDINGS

Motion limited evaluation, particularly proximally and at the
carotid bifurcations. Within this limitation:

Aorta: Patent great vessel origins.

Carotid system: Retropharyngeal course of the common carotid
arteries bilaterally. Bilateral common carotid arteries and internal
carotid arteries are patent without evidence of high-grade stenosis.
Limited assessment for stenosis at the carotid bifurcations due to
motion.

Vertebral arteries: Nondiagnostic evaluation of the vertebral artery
origins due to artifact. The visualized vertebral arteries are
patent without evidence of high-grade stenosis. Approximately 5 x 3
mm outpouching arising from the right V2 vertebral artery.
IMPRESSION: MRI:

1. No evidence of acute intracranial abnormality.
2. Mild-to-moderate chronic microvascular ischemic disease.

MRA head:

No large vessel occlusion or proximal hemodynamically significant
stenosis.

MRA neck:

1. Motion limited study without evidence of occlusion or visible
high-grade stenosis.
2. Approximately 5 x 3 mm outpouching arising from the right V2
vertebral artery, suspicious for aneurysm but not well characterized
by motion limited MRA. A CTA of the neck could better characterize.

## 2021-09-20 IMAGING — MR MR HEAD W/O CM
6 of 10 series · 28 of 48 positions shown · non-contrast
Comparison: CT head from the same day.

CLINICAL DATA: Mental status change, unknown cause; Vertigo,
central; Neuro deficit, acute, stroke suspected



[Series 2: DWI · axial · 3.0mm · 0.94mm/px · z∈[-89,+63]mm · 8 of 103 slices shown (1 of 2)]
[im 1/103]
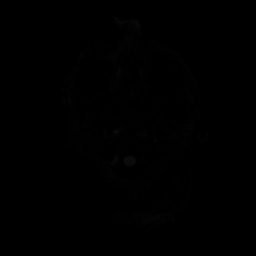
[im 12/103]
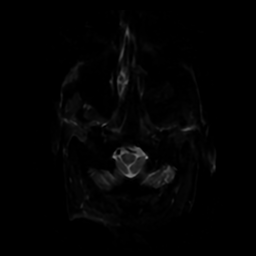
[im 35/103]
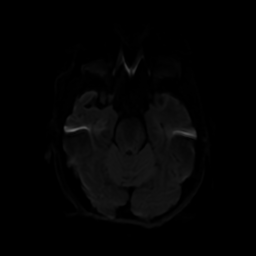
[im 46/103]
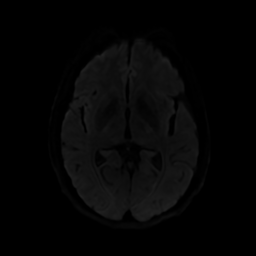
[im 57/103]
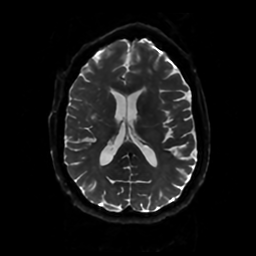
[im 69/103]
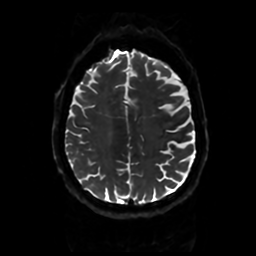
[im 91/103]
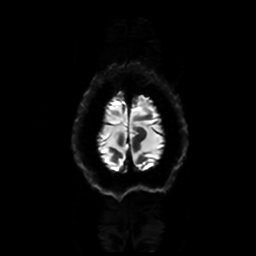
[im 103/103]
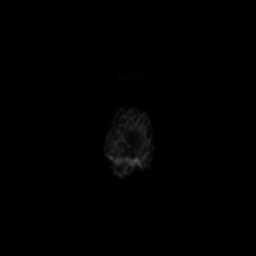

[Series 4: DWI · coronal · 4.0mm · 0.94mm/px · 7 of 70 slices shown (2 of 2)]
[im 1/70]
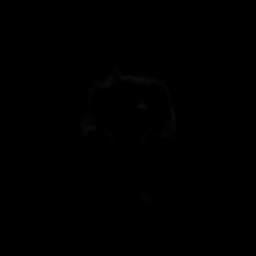
[im 12/70]
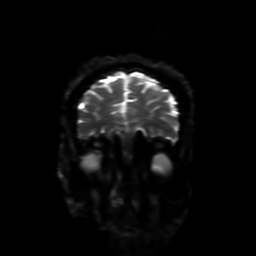
[im 24/70]
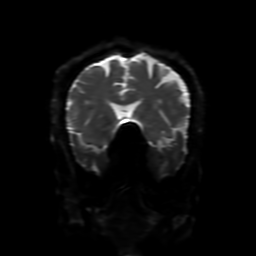
[im 35/70]
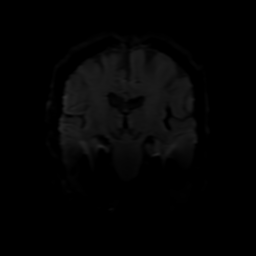
[im 47/70]
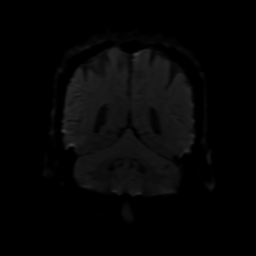
[im 58/70]
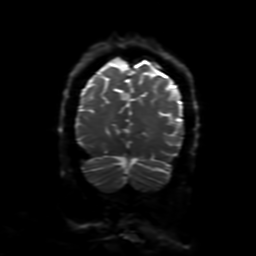
[im 70/70]
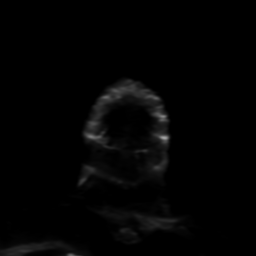

[Series 5: FLAIR · sagittal · 5.0mm · 0.23mm/px · 2 of 23 slices shown (1 of 2)]
[im 1/23]
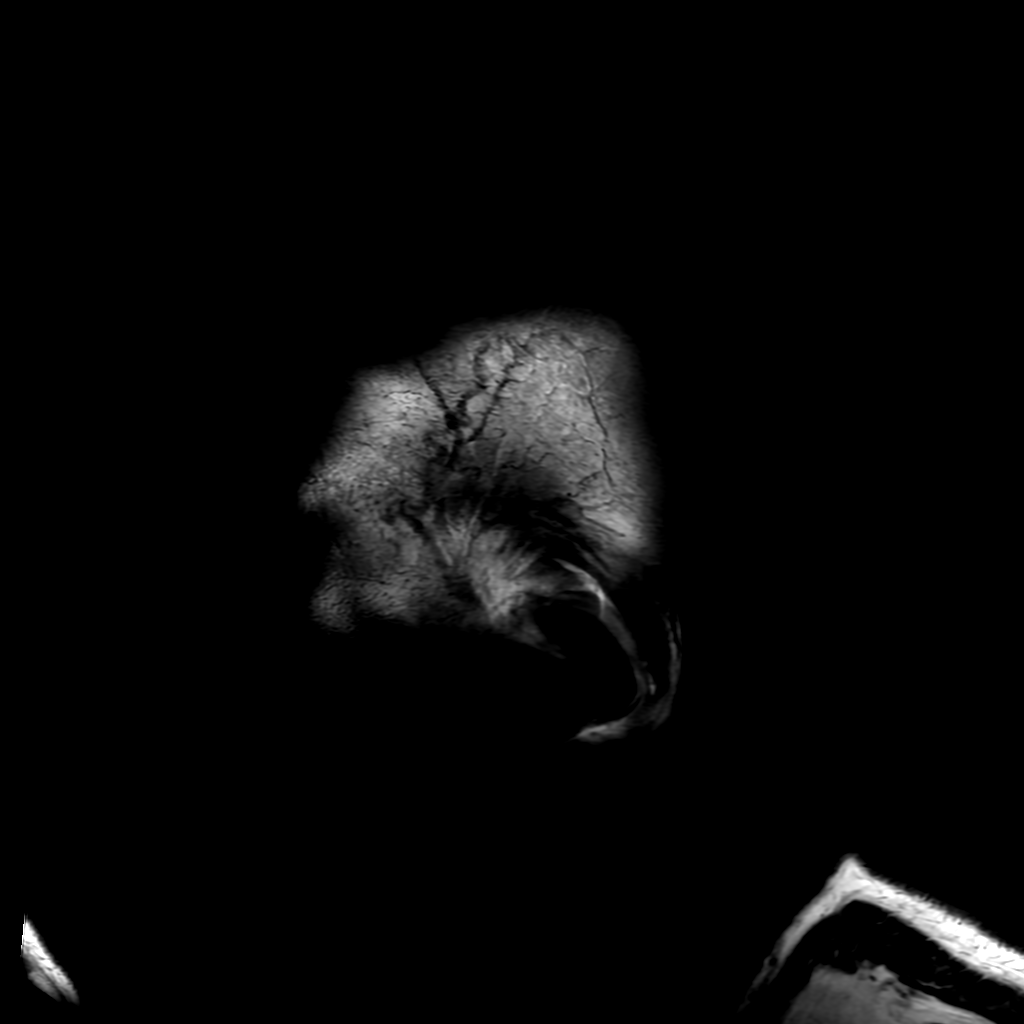
[im 23/23]
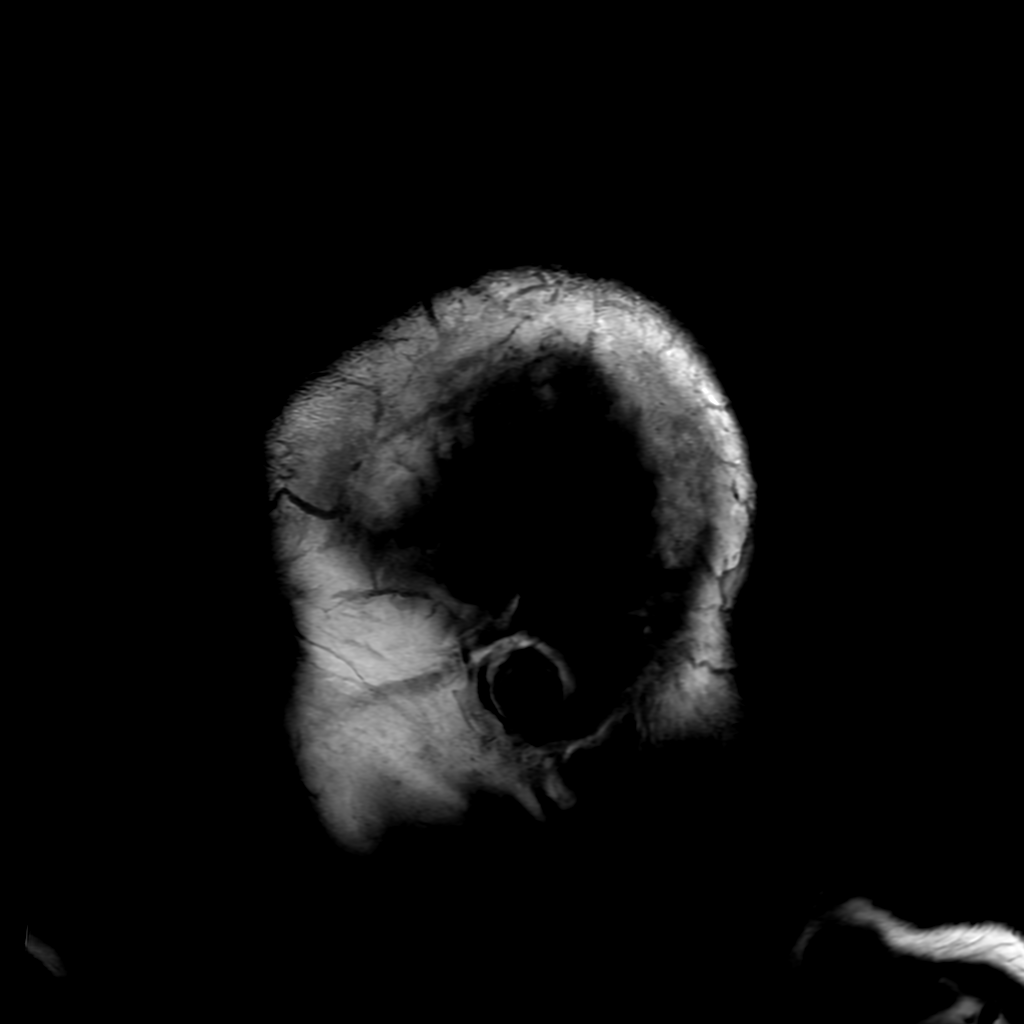

[Series 7: FLAIR · axial · 4.0mm · 0.45mm/px · z∈[-85,+55]mm · 3 of 33 slices shown (2 of 2)]
[im 1/33]
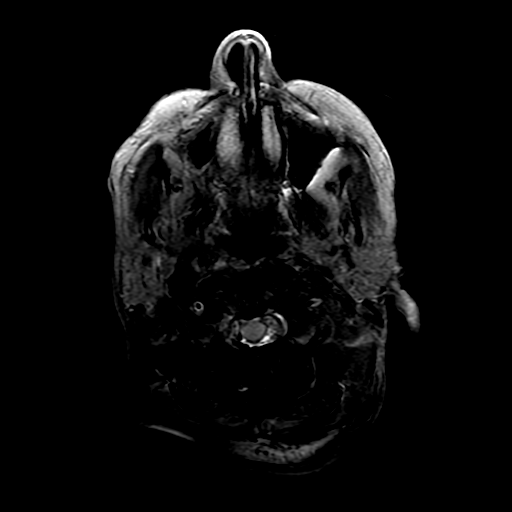
[im 17/33]
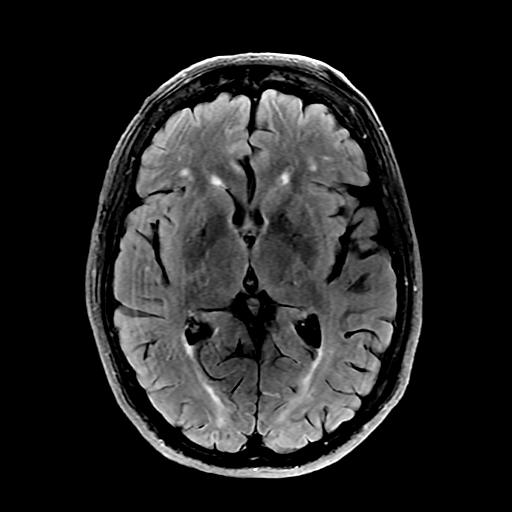
[im 33/33]
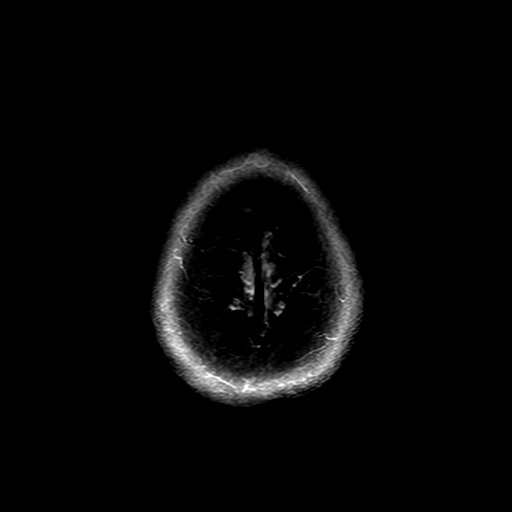

[Series 250: ADC · axial · 3.0mm · 0.94mm/px · z∈[-89,+63]mm · 5 of 51 slices shown (1 of 2)]
[im 1/51]
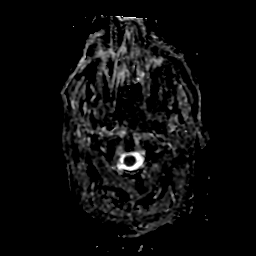
[im 13/51]
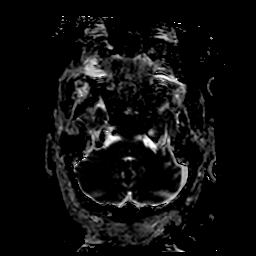
[im 26/51]
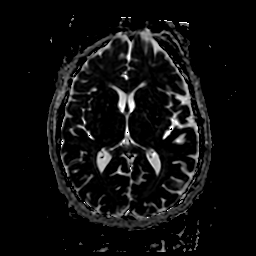
[im 38/51]
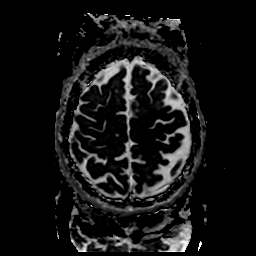
[im 51/51]
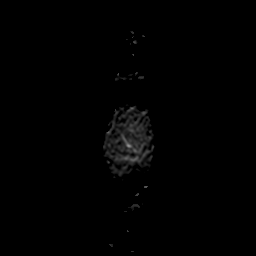

[Series 450: ADC · coronal · 4.0mm · 0.94mm/px · 3 of 35 slices shown (2 of 2)]
[im 1/35]
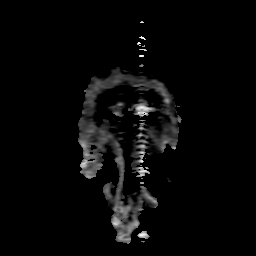
[im 18/35]
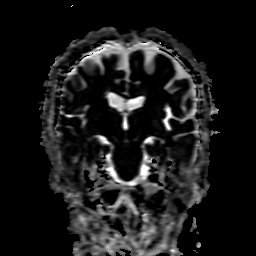
[im 35/35]
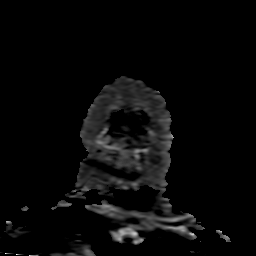

[28 of 48 positions shown; findings below may reference images not displayed]

FINDINGS: MRI HEAD FINDINGS

Brain: No acute infarction, hemorrhage, hydrocephalus, extra-axial
collection or intraparenchymal mass lesion. Mild to moderate
scattered T2/FLAIR hyperintensities in the white matter, nonspecific
but compatible with chronic microvascular ischemic disease. Mildly
T1 hyperintense small midline structure in the posterior
nasopharynx, probably an incidental complex Tornwaldt cyst.

Vascular: Detailed below.

Skull and upper cervical spine: Normal marrow signal.

Sinuses/Orbits: Minimal paranasal sinus mucosal thickening.
Unremarkable orbits.

Other: Trace right mastoid effusion.

MRA HEAD FINDINGS

Anterior circulation: Bilateral intracranial ICAs, MCAs, and ACAs
are patent without proximal hemodynamically significant stenosis. No
aneurysm identified.

Posterior circulation: Bilateral intradural vertebral arteries,
basilar artery and posterior cerebral arteries are patent without
proximal hemodynamically significant stenosis. Small left P1 PCA
with prominent right posterior communicating artery, anatomic
variant. Bilateral PICAs and superior cerebellar arteries are patent
proximally. No aneurysm identified.

MRA NECK FINDINGS

Motion limited evaluation, particularly proximally and at the
carotid bifurcations. Within this limitation:

Aorta: Patent great vessel origins.

Carotid system: Retropharyngeal course of the common carotid
arteries bilaterally. Bilateral common carotid arteries and internal
carotid arteries are patent without evidence of high-grade stenosis.
Limited assessment for stenosis at the carotid bifurcations due to
motion.

Vertebral arteries: Nondiagnostic evaluation of the vertebral artery
origins due to artifact. The visualized vertebral arteries are
patent without evidence of high-grade stenosis. Approximately 5 x 3
mm outpouching arising from the right V2 vertebral artery.
IMPRESSION: MRI:

1. No evidence of acute intracranial abnormality.
2. Mild-to-moderate chronic microvascular ischemic disease.

MRA head:

No large vessel occlusion or proximal hemodynamically significant
stenosis.

MRA neck:

1. Motion limited study without evidence of occlusion or visible
high-grade stenosis.
2. Approximately 5 x 3 mm outpouching arising from the right V2
vertebral artery, suspicious for aneurysm but not well characterized
by motion limited MRA. A CTA of the neck could better characterize.

## 2021-09-20 MED ORDER — GADOBUTROL 1 MMOL/ML IV SOLN
7.0000 mL | Freq: Once | INTRAVENOUS | Status: AC | PRN
  Administered 2021-09-20: 7 mL via INTRAVENOUS

## 2021-09-20 MED ORDER — ENOXAPARIN SODIUM 40 MG/0.4ML IJ SOSY
40.0000 mg | PREFILLED_SYRINGE | INTRAMUSCULAR | Status: DC
  Administered 2021-09-20 – 2021-09-22 (×3): 40 mg via SUBCUTANEOUS
  Filled 2021-09-20 (×3): qty 0.4

## 2021-09-20 MED ORDER — POTASSIUM CHLORIDE CRYS ER 20 MEQ PO TBCR: 40.0000 meq | EXTENDED_RELEASE_TABLET | Freq: Once | ORAL | Status: DC

## 2021-09-20 MED ORDER — CARBIDOPA-LEVODOPA 25-100 MG PO TABS: 1.0000 | ORAL_TABLET | Freq: Four times a day (QID) | ORAL | Status: DC

## 2021-09-20 MED ORDER — LACTATED RINGERS IV BOLUS
1000.0000 mL | Freq: Once | INTRAVENOUS | Status: AC
  Administered 2021-09-20: 1000 mL via INTRAVENOUS

## 2021-09-20 MED ORDER — IOHEXOL 350 MG/ML SOLN
65.0000 mL | Freq: Once | INTRAVENOUS | Status: AC | PRN
Start: 2021-09-20 — End: 2021-09-20
  Administered 2021-09-20: 65 mL via INTRAVENOUS

## 2021-09-20 MED ORDER — MAGNESIUM SULFATE IN D5W 1-5 GM/100ML-% IV SOLN
1.0000 g | Freq: Once | INTRAVENOUS | Status: AC
  Administered 2021-09-20: 1 g via INTRAVENOUS
  Filled 2021-09-20: qty 100

## 2021-09-20 MED ORDER — POTASSIUM CHLORIDE 10 MEQ/100ML IV SOLN
10.0000 meq | INTRAVENOUS | Status: AC
  Administered 2021-09-20 (×3): 10 meq via INTRAVENOUS
  Filled 2021-09-20 (×3): qty 100

## 2021-09-20 MED ORDER — POTASSIUM CHLORIDE 10 MEQ/100ML IV SOLN: 10.0000 meq | INTRAVENOUS | Status: DC

## 2021-09-20 MED ORDER — CARBIDOPA-LEVODOPA 25-100 MG PO TABS
1.0000 | ORAL_TABLET | Freq: Four times a day (QID) | ORAL | Status: DC
  Administered 2021-09-21 – 2021-09-22 (×5): 1
  Filled 2021-09-20 (×7): qty 1

## 2021-09-20 NOTE — Assessment & Plan Note (Addendum)
CTA neck also obtained to further evaluate and showed fusiform dilatation of the right V2 measuring up to 5 mm extending for approximately 5 mm at the level of C4.  ?-Can follow up with neurosurgery outpatient for monitoring  ?

## 2021-09-20 NOTE — Consult Note (Signed)
NEUROLOGY CONSULTATION NOTE  ? ?Date of service: September 20, 2021 ?Patient Name: Alyssa LoosenBetty Joines ?MRN:  [id number] ?DOB:  1941-04-13 ?Reason for consult: "unresponsive" ?Requesting Provider: Anselm Junglingu, Ching T, DO ?_ _ _   _ __   _ __ _ _  __ __   _ __   __ _ ? ?History of Present Illness  ?Alyssa Boyd is a 81 y.o. female with PMH significant for COPD, HTN, HLD, Parkinson's disease, OSA, GERD, diabetes type 2 who presents with somnolence and poor responsive. ? ?Patient unable to provide any history secondary to somnolence. Daughter at bedside reports patient and her husband moved from [place] 2 weeks ago to be closer to their children. She seemed fine. They are moving into an assisted living facility and the facility just took over her medications from 3/12. Patient has not been on any new medications. Daughter reports that she noted that patient was more somnolent on Sunday but would get up to use the bathroom. On Monday, they found that she would not wake up for them. Patient would not open her eyes so they eventually brought her to the ED. ? 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  Vitals here have been stable with no fever, chemistry with mild hypokalemia, CBC with no leukocytosis, lactate is normal, UA not concerning for UTI, CXR not concerning for pneumonia. ? ?She has not had any recent illness. She has not been tested for COVID here. ? ?She had recent L foot fracture. ?  ?ROS  ? ?Unable to obtain detailed ROS given obtunded mentation. ? ?Past History  ? ?Past Medical History:  ?Diagnosis Date  ? COPD (chronic obstructive pulmonary disease) (HCC)   ? Diabetes mellitus without complication (HCC)   ? Hyperlipidemia   ? Hypertension   ? Parkinson disease (HCC)   ? ?History reviewed. No pertinent surgical history. ?No family history on file. ?Social History  ? ?Socioeconomic History  ? Marital status: Married   ?  Spouse name: Not on file  ? Number of children: Not on file  ? Years of education: Not on file  ? Highest education level: Not on file  ?Occupational History  ? Not on file  ?Tobacco Use  ? Smoking status: Not on file  ? Smokeless tobacco: Not on file  ?Substance and Sexual Activity  ? Alcohol use: Not on file  ? Drug use: Not on file  ? Sexual activity: Not on file  ?Other Topics Concern  ? Not on file  ?Social History Narrative  ? Not on file  ? ?Social Determinants of Health  ? ?Financial Resource Strain: Not on file  ?Food Insecurity: Not on file  ?Transportation Needs: Not on file  ?Physical Activity: Not on file  ?Stress: Not on file  ?Social Connections: Not on file  ? ?Allergies  ?Allergen Reactions  ? Shellfish Allergy   ? ? ?Medications  ?(Not in a hospital admission) ?  ? ?Vitals  ? ?Vitals:  ? 09/20/21 1645 09/20/21 1745 09/20/21 1830 09/20/21 1900  ?BP: (!) 135/58 (!) 191/67 (!) 183/66 (!) 175/63  ?Pulse: (!) 58 (!) 52 (!) 52 (!) 52  ?Resp: 11 13 19 16   ?Temp:      ?TempSrc:      ?SpO2: 99% 100% 100% 100%  ?Weight:      ?Height:      ?  ? ?Body mass index is 26.63  kg/m?. ? ?Physical Exam  ? ?General: Laying comfortably in bed; in no acute distress. ?HENT: Normal oropharynx and mucosa. Normal external appearance of ears and nose.  ?Neck: Supple, no pain or tenderness  ?CV: No JVD. No peripheral edema.  ?Pulmonary: Symmetric Chest rise. Normal respiratory effort.  ?Abdomen: Soft to touch, non-tender.  ?Ext: No cyanosis, edema, or deformity  ?Skin: No rash. Normal palpation of skin.   ?Musculoskeletal: Normal digits and nails by inspection. No clubbing.  ? ?Neurologic Examination  ?Mental status/Cognition: grimaces to loud clap and vigorous tactile stimulation. Does not open eyes, no attempts to follow commands. ?Speech/language: no speech throughout the encounter, no attempts to communicate. ? ?Cranial nerves:  ? CN II Pupils equal and reactive to light, unable to assess for VF deficits.  ? CN  III,IV,VI EOM intact to dolls eyes, no gaze preference or deviation, no nystagmus  ? CN V Corneals + BL  ? CN VII Symmetric facial grimace.  ? CN VIII Does not turn head towards speech  ? CN IX & X Unable to visualize her palate. Seems to be protecting her airway.  ? CN XI Head tilted to her right.  ? CN XII Unable to visualize her tongue as her mouth is closed.  ? ?Motor:  ?Muscle bulk: poor, tone increased with rigidity, tremors: seems to have mild parkinsonian tremor. ?Unable to do detailed strength testing due to somnolence. Her BL arms slowly drift down when held up in the air. ?Her legs are stiff and they appear to immediately fall to the ground. ? ? ?Reflexes: ? Right Left Comments  ?Pectoralis     ? Biceps (C5/6) 2 2   ?Brachioradialis (C5/6) 2 2   ? Triceps (C6/7) 2 2   ? Patellar (L3/4) 2 2   ? Achilles (S1) 2 - Deferred 2/2 L foot fracture.  ? Hoffman     ? Plantar     ?Jaw jerk   ? ?Sensation: ? Light touch Grimaces to movement in all arms and legs.  ? Pin prick   ? Temperature   ? Vibration   ?Proprioception   ? ?Coordination/Complex Motor:  ?- Unable to assess. ?- Gait: unsafe to assess. ? ?Labs  ? ?CBC:  ?Recent Labs  ?Lab 09/20/21 ?1110 09/20/21 ?1516  ?WBC 5.9  --   ?NEUTROABS 3.2  --   ?HGB 12.8 10.2*  ?HCT 36.3 30.0*  ?MCV 92.6  --   ?PLT 235  --   ? ? ?Basic Metabolic Panel:  ?Lab Results  ?Component Value Date  ? NA 141 09/20/2021  ? K 3.0 (L) 09/20/2021  ? CO2 33 (H) 09/20/2021  ? GLUCOSE 115 (H) 09/20/2021  ? BUN 5 (L) 09/20/2021  ? CREATININE 0.74 09/20/2021  ? CALCIUM 9.9 09/20/2021  ? GFRNONAA >60 09/20/2021  ? ?Lipid Panel: No results found for: LDLCALC ?HgbA1c: No results found for: HGBA1C ?Urine Drug Screen: No results found for: LABOPIA, COCAINSCRNUR, LABBENZ, AMPHETMU, THCU, LABBARB  ?Alcohol Level No results found for: ETH ? ?CT Head without contrast(Personally reviewed): ?CTH was negative for a large hypodensity concerning for a large territory infarct or hyperdensity concerning for  an ICH ? ?CT angio Head and Neck with contrast: ?pending ? ?MR Angio head without contrast and Carotid Duplex BL(Personally reviewed): ?Right V2 aneurysm. ? ?MRI Brain(Personally reviewed): ?No acute intracranial abnormality. ? ?rEEG:  ?pending ? ?Impression  ? ?Rosealee Recinos is a 81 y.o. female with PMH significant for COPD, HTN, HLD, Parkinson's disease, OSA, GERD, diabetes  type 2 who presents with somnolence and poor responsive. No obvious focal deficit. No clear cause to her poor mentation at this time. Imaging with MRI, MRA has been non revealing, infectious workup not concerning for a UTI or a URI. Low suspicion for meningitis given no nuchal rigidity, no leukocytosis, no fever. Chemistry does not reveal any significant electrolyte abnormality. No alcohol or recreational substance use. ? ?Will get workup with TSH, Vit B12, folate but would not expect it to cause a rapid onset of poor mentation. Another concern that I have is that patient's medication administration was just taken over by her Nursing home a couple days ago. She seems to have been prescribed baclofen TID PRN. It is unclear how often she was taking it in the past. Per the [facility] at bedside, she has been getting is scheduled TID for the last couple days. ? ?In addition, will also get COVID and flu swab and a routine EEG. ? ?Recommendations  ?- routine EEG ?- daughter to verify with her family  about how often patient was taking baclofen at home. ?- COVID 19 and Flu swab. ?- further workup pending above. ? ______________________________________________________________________ ? ? ?Thank you for the opportunity to take part in the care of this patient. If you have any further questions, please contact the neurology consultation attending. ? ?Signed, ? ?Erick Blinks ?[facility] ?Pager Number [phone] ?_ _ _   _ __   _ __ _ _  __ __   _ __   __ _ ? ?

## 2021-09-20 NOTE — Assessment & Plan Note (Signed)
Mg of 1.5. Replete with 1g of Mg ?

## 2021-09-20 NOTE — Assessment & Plan Note (Addendum)
-  repleted with IV K x3 in ED. Also will replete Mg.  ?

## 2021-09-20 NOTE — Assessment & Plan Note (Addendum)
-  pt has inversion of her left foot at baseline due to Parkinson's and had a non-traumatic fracture this week. She is currently following [facility] outpatient to decide on definite treatment. Daughter will bring back her boot tomorrow for immobilization while inpatient.  ?

## 2021-09-20 NOTE — Assessment & Plan Note (Signed)
Resume oral pending her improvement in clinical status ?

## 2021-09-20 NOTE — Assessment & Plan Note (Addendum)
-   NG tube placement per neurology in order to receive her Sinemet overnight  ?

## 2021-09-20 NOTE — Assessment & Plan Note (Signed)
No hyperglycemia. Monitor with morning labs for now while she is not able to take anything PO ?

## 2021-09-20 NOTE — ED Provider Notes (Signed)
?[facility] EMERGENCY DEPARTMENT ?Provider Note ? ? ?CSN: [id number] ?Arrival date & time: 09/20/21  1058 ? ?  ? ?History ? ?Chief Complaint  ?Patient presents with  ? Altered Mental Status  ? ? ?Alyssa Boyd is a 81 y.o. female. ? ? ?Altered Mental Status ? ?81 year old female with a history of Parkinson's disease and dementia who presents from [facility] facility with altered mental status.  The patient was last normal last night around 2200 when she went to bed.  She her baseline is talkative and pleasantly demented, able to hold a conversation.  Patient has been nonambulatory for the last few days since a recent fall with resultant left ankle fracture that is being managed conservatively.  She was notably somnolent and unresponsive upon arousal this morning.  Vital signs were stable with EMS and the patient was not hypoglycemic.  She was transported to [facility], ER for further evaluation and management.  She arrived GCS 10 (2-3-5), ABC intact.  Per family members who arrived later, the patient is clearly disoriented and not at her baseline. ? ?Home Medications ?Prior to Admission medications   ?Not on File  ?   ? ?Allergies    ?Shellfish allergy   ? ?Review of Systems   ?Review of Systems  ?Unable to perform ROS: Dementia  ? ?Physical Exam ?Updated Vital Signs ?BP (!) 170/60   Pulse (!) 57   Temp 98 ?F (36.7 ?C) (Oral)   Resp 17   Ht 5\' 5"  (1.651 m)   Wt 72.6 kg   SpO2 100%   BMI 26.63 kg/m?  ?Physical Exam ?Vitals and nursing note reviewed.  ?Constitutional:   ?   General: She is not in acute distress. ?HENT:  ?   Head: Normocephalic and atraumatic.  ?   Mouth/Throat:  ?   Mouth: Mucous membranes are moist.  ?Eyes:  ?   Conjunctiva/sclera: Conjunctivae normal.  ?   Pupils: Pupils are equal, round, and reactive to light.  ?Neck:  ?   Comments: No nuchal rigidity ?Cardiovascular:  ?   Rate and Rhythm: Normal rate and regular rhythm.  ?Pulmonary:  ?   Effort: Pulmonary effort is  normal. No respiratory distress.  ?   Breath sounds: Normal breath sounds.  ?Abdominal:  ?   General: There is no distension.  ?   Tenderness: There is no abdominal tenderness. There is no guarding.  ?Musculoskeletal:     ?   General: No deformity or signs of injury.  ?   Cervical back: Full passive range of motion without pain and neck supple.  ?Skin: ?   Findings: No lesion or rash.  ?Neurological:  ?   Mental Status: She is alert. She is disoriented.  ?   Comments: GCS 10, (2-3-5), somnolent, arousable to tactile stimulation/sternal rub, withdraws to pain all 4 extremities and will localize to pain.  No clear cranial nerve deficit, difficult to assess full motor strength in all 4 extremities.  ? ? ?ED Results / Procedures / Treatments   ?Labs ?(all labs ordered are listed, but only abnormal results are displayed) ?Labs Reviewed  ?COMPREHENSIVE METABOLIC PANEL - Abnormal; Notable for the following components:  ?    Result Value  ? Potassium 2.8 (*)   ? CO2 33 (*)   ? Glucose, Bld 115 (*)   ? BUN 5 (*)   ? Total Protein 6.4 (*)   ? All other components within normal limits  ?URINALYSIS, ROUTINE W REFLEX MICROSCOPIC -  Abnormal; Notable for the following components:  ? APPearance CLOUDY (*)   ? Hgb urine dipstick SMALL (*)   ? All other components within normal limits  ?MAGNESIUM - Abnormal; Notable for the following components:  ? Magnesium 1.5 (*)   ? All other components within normal limits  ?I-STAT VENOUS BLOOD GAS, ED - Abnormal; Notable for the following components:  ? pH, Ven 7.560 (*)   ? pCO2, Ven 35.4 (*)   ? Bicarbonate 31.7 (*)   ? TCO2 33 (*)   ? Acid-Base Excess 9.0 (*)   ? Potassium 3.0 (*)   ? HCT 30.0 (*)   ? Hemoglobin 10.2 (*)   ? All other components within normal limits  ?CULTURE, BLOOD (ROUTINE X 2)  ?CULTURE, BLOOD (ROUTINE X 2)  ?RESP PANEL BY RT-PCR (FLU A&B, COVID) ARPGX2  ?LACTIC ACID, PLASMA  ?LACTIC ACID, PLASMA  ?CBC WITH DIFFERENTIAL/PLATELET  ?PROTIME-INR  ?AMMONIA   ? ? ?EKG ?None ? ?Radiology ?DG Chest 2 View ? ?Result Date: 09/20/2021 ?CLINICAL DATA:  Sepsis. EXAM: CHEST - 2 VIEW COMPARISON:  None. FINDINGS: The heart size and mediastinal contours are within normal limits. Both lungs are clear. No pleural effusion or edema. Age-indeterminate left lateral sixth and seventh rib fractures. IMPRESSION: 1. No acute cardiopulmonary abnormalities. 2. Age-indeterminate left lateral sixth and seventh rib fractures. Electronically Signed   By: Signa Kell M.D.   On: 09/20/2021 11:50  ? ?CT HEAD WO CONTRAST ( ) ? ?Result Date: 09/20/2021 ?CLINICAL DATA:  Altered mental status. History of Parkinson's and dementia. EXAM: CT HEAD WITHOUT CONTRAST TECHNIQUE: Contiguous axial images were obtained from the base of the skull through the vertex without intravenous contrast. RADIATION DOSE REDUCTION: This exam was performed according to the departmental dose-optimization program which includes automated exposure control, adjustment of the mA and/or kV according to patient size and/or use of iterative reconstruction technique. COMPARISON:  None. FINDINGS: Brain: No evidence of acute infarction, hemorrhage, hydrocephalus, extra-axial collection or mass lesion/mass effect. Vascular: No hyperdense vessel or unexpected calcification. Skull: Normal. Negative for fracture or focal lesion. Severe right TMJ osteoarthritis. Sinuses/Orbits: No acute finding. Other: None. IMPRESSION: 1. No acute intracranial abnormality. Electronically Signed   By: Obie Dredge M.D.   On: 09/20/2021 11:37  ? ?MR ANGIO HEAD WO CONTRAST ? ?Result Date: 09/20/2021 ?CLINICAL DATA:  Mental status change, unknown cause; Vertigo, central; Neuro deficit, acute, stroke suspected EXAM: MRI HEAD WITHOUT CONTRAST MRA HEAD WITHOUT CONTRAST MRA NECK WITHOUT CONTRAST TECHNIQUE: Multiplanar, multiecho pulse sequences of the brain and surrounding structures were obtained without intravenous contrast. Angiographic images of the Circle  of Willis were obtained using MRA technique without intravenous contrast. Angiographic images of the neck were obtained using MRA technique without intravenous contrast. Carotid stenosis measurements (when applicable) are obtained utilizing NASCET criteria, using the distal internal carotid diameter as the denominator. COMPARISON:  CT head from the same day. FINDINGS: MRI HEAD FINDINGS Brain: No acute infarction, hemorrhage, hydrocephalus, extra-axial collection or intraparenchymal mass lesion. Mild to moderate scattered T2/FLAIR hyperintensities in the white matter, nonspecific but compatible with chronic microvascular ischemic disease. 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 Vascular: Detailed below. Skull and upper cervical spine: Normal marrow signal. Sinuses/Orbits: Minimal paranasal sinus mucosal thickening. Unremarkable orbits. Other: Trace right mastoid effusion. MRA HEAD FINDINGS Anterior circulation: Bilateral intracranial ICAs, MCAs, and ACAs are patent without proximal hemodynamically significant stenosis. No aneurysm identified. Posterior circulation: Bilateral intradural vertebral arteries, basilar  artery and posterior cerebral arteries are patent without proximal hemodynamically significant stenosis. Small left P1 PCA with prominent right posterior communicating artery, anatomic variant. Bilateral PICAs and superior cerebellar arteries are patent proximally. No aneurysm identified. MRA NECK FINDINGS Motion limited evaluation, particularly proximally and at the carotid bifurcations. Within this limitation: Aorta: Patent great vessel origins. Carotid system: Retropharyngeal course of the common carotid arteries bilaterally. Bilateral common carotid arteries and internal carotid arteries are patent without evidence of high-grade stenosis. Limited assessment for stenosis at the carotid bifurcations due to motion. Vertebral  arteries: Nondiagnostic evaluation of the vertebral artery origins due to artifact. The visualized vertebral arteries are patent without evidence of high-grade stenosis. Approximately 5 x 3 mm outpouching arising from the right

## 2021-09-20 NOTE — ED Triage Notes (Signed)
Pt bib [facility] with hx dementia and parkinsons arrives with altered mental status. LKW last night 2200. Pt hot to the touch on scene. At baseline pt able to talk. Pt nonverbal, ems reports initially unresponsive to pain. VSS with EMS. 20g RFA.  ?

## 2021-09-20 NOTE — Progress Notes (Signed)
EEG complete - results pending 

## 2021-09-20 NOTE — ED Provider Notes (Signed)
3:41 PM ?Care assumed from Dr. Hart Rochester.  At time of transfer of care, patient is awaiting for results of MRI and urinalysis to help determine etiology of altered mental status and aphasia today.  As patient normally is conversant by report and has not been able to speak since 10 PM last night, anticipate she will need admission for further work-up after ED work-up is concluded. ? ?7:17 PM ?Her MRI returned and did not show evidence of acute stroke but did show evidence of possible right V2 aneurysm.  I went to assess patient and discussed this with the family.  CTA was recommended so this was ordered.  When I assessed the patient she is still slumped over and not responding to verbal commands.  Per family she is conversant, normally able to hold long conversations including ordering her own food.  This is a drastic difference than yesterday.  When I was assessing patient also thought I saw her mouth twitch briefly which could indicate possible seizure.  Her lactic acid was normal x2 which would argue against seizures however I called neurology who will come see her.  They agree with getting the CTA and admission to medicine for altered mental status with somnolence and minimal responsiveness tonight. ? ?Patient will be admitted for further management of altered mental status ? ? ?Clinical Impression: ?1. Somnolence   ?2. Altered mental status, unspecified altered mental status type   ? ? ?Disposition: Admit ? ?This note was prepared with assistance of Conservation officer, historic buildings. Occasional wrong-word or sound-a-like substitutions may have occurred due to the inherent limitations of voice recognition software. ? ? ?  ?Joshuwa Vecchio, Canary Brim, MD ?09/20/21 2309 ? ?

## 2021-09-20 NOTE — ED Notes (Signed)
In and out cath attempted with no urine output. MD aware.  ?

## 2021-09-20 NOTE — Assessment & Plan Note (Signed)
-  Patient previously takes her baclofen PRN but moved to a new facility and was given Baclofen TID and symptoms correlates with the timeline ?-CT head, MRI, MRA head and neck, CTA neck showed no acute findings other than vertebral aneurysm ?-She was noted to have some mild fasciculation of her tongue- EEG pending  ? ?

## 2021-09-20 NOTE — ED Notes (Signed)
Pt transported to MRI 

## 2021-09-20 NOTE — H&P (Signed)
?History and Physical  ? ? ?Patient: Myrissa Chipley TDS:[id number] DOB: 07/25/1940 ?DOA: 09/20/2021 ?DOS: the patient was seen and examined on 09/20/2021 ?PCP: Joycelyn Man, NP  ?Patient coming from:  [facility] ? ?Chief Complaint:  ?Chief Complaint  ?Patient presents with  ? Altered Mental Status  ? ?HPI: Debbie Yearick is a 81 y.o. female with medical history significant of Parkinson's disease, asthma, hypertension, type 2 diabetes, OSA, restless leg syndrome, GERD who presents with concerns of altered mental status. ? ?Daughter at bedside provides history. ?Patient just moved here last week from [place] to be closer to her grandchildren.  She initially lived with her daughter and then moved into [facility] 4 days ago with her husband. He administered her medications for the first two days and then transitioned to meds given by facility.  Husband was giving her baclofen PRN rather than the prescribed 3 times a day.  However facility was not aware of this and started giving it to her regularly 2 days ago.  She became more fatigue and eventually today was completely lethargic.  At baseline, patient can walk and feed herself but needs help with her other ADLs.  Today she is completely bedbound and unable to respond except to painful stimuli. ? ?In the ED, she was afebrile bradycardic and mildly hypertensive with BP of 170/60.  CBC with no leukocytosis or anemia.  Sodium of 142, K of 2.8, magnesium of 1.5, BG of 115, creatinine of 0.74. ? ?CT of the head was negative.  MRI brain was negative.  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  CTA neck also obtained to further evaluate and showed fusiform dilatation of the right V2 measuring up to 5 mm extending for approximately 5 mm at the level of C4.  Right vertebral artery is otherwise unremarkable.  No significant stenosis in the neck. ? ?She was also noted to have some slight twitching of her tongue concerning  for seizure activity.  Neurology was consulted and hospitalist was called for admission.   ?Review of Systems: unable to review all systems due to the inability of the patient to answer questions. ?Past Medical History:  ?Diagnosis Date  ? COPD (chronic obstructive pulmonary disease) (HCC)   ? Diabetes mellitus without complication (HCC)   ? Hyperlipidemia   ? Hypertension   ? Parkinson disease (HCC)   ? ?History reviewed. No pertinent surgical history. ?Social History:  has no history on file for tobacco use, alcohol use, and drug use. ? ?Allergies  ?Allergen Reactions  ? Shellfish Allergy   ? ? ?No family history on file. ? ?Prior to Admission medications   ?Not on File  ? ? ?Physical Exam: ?Vitals:  ? 09/20/21 1830 09/20/21 1900 09/20/21 2030 09/20/21 2130  ?BP: (!) 183/66 (!) 175/63 (!) 170/60 (!) 158/80  ?Pulse: (!) 52 (!) 52 (!) 57 70  ?Resp: 19 16 17  (!) 8  ?Temp:      ?TempSrc:      ?SpO2: 100% 100% 100% 100%  ?Weight:      ?Height:      ? ?Constitutional: Thin, frail elderly female ?Eyes: PERRL, lids and conjunctivae normal ?ENMT: Mucous membranes are moist. Posterior pharynx clear of any exudate or lesions.Normal dentition.  ?Neck: normal, supple, no masses, no thyromegaly ?Respiratory: clear to auscultation bilaterally, no wheezing, no crackles. Normal respiratory effort. No accessory muscle use.  ?Cardiovascular: Regular rate and rhythm, no murmurs / rubs / gallops. No extremity edema. 2+ pedal pulses.  No carotid bruits.  ?Abdomen: no tenderness, no masses palpated. No hepatosplenomegaly. Bowel sounds positive.  ?Musculoskeletal: no clubbing / cyanosis. No joint deformity upper and lower extremities. Good ROM, no contractures. Normal muscle tone.  ?Skin: no rashes, lesions, ulcers. No induration ?Neurologic: CN 2-12 grossly intact. Sensation intact, DTR normal. Strength 5/5 in all 4.  ?Psychiatric: Normal judgment and insight. Alert and oriented x 3. Normal mood. ?Data Reviewed: ? ?See HPI ? ?Assessment  and Plan: ?* Acute metabolic encephalopathy ?-Patient previously takes her baclofen PRN but moved to a new facility and was given Baclofen TID and symptoms correlates with the timeline ?-CT head, MRI, MRA head and neck, CTA neck showed no acute findings other than vertebral aneurysm ?-She was noted to have some mild fasciculation of her tongue- EEG pending  ? ? ?Hypomagnesemia ?Mg of 1.5. Replete with 1g of Mg ? ?Hypokalemia ?-repleted with IV K x3 in ED. Also will replete Mg.  ? ?Foot fracture, left, sequela ?-pt has inversion of her left foot at baseline due to Parkinson's and had a non-traumatic fracture this week. She is currently following [facility] outpatient to decide on definite treatment. Daughter will bring back her boot tomorrow for immobilization while inpatient.  ? ?Aneurysm of vertebral artery (HCC) ?CTA neck also obtained to further evaluate and showed fusiform dilatation of the right V2 measuring up to 5 mm extending for approximately 5 mm at the level of C4.  ?-Can follow up with neurosurgery outpatient for monitoring  ? ?Type 2 diabetes mellitus (HCC) ?No hyperglycemia. Monitor with morning labs for now while she is not able to take anything PO ? ?HTN (hypertension) ?Resume oral pending her improvement in clinical status ? ?Parkinson disease (HCC) ?- NG tube placement per neurology in order to receive her Sinemet overnight  ? ? ? ? ? Advance Care Planning:   Code Status: Full Code Full ? ?Consults: neurology ? ?Family Communication: Discussed with daughter at bedside ? ?Severity of Illness: ?The appropriate patient status for this patient is OBSERVATION. Observation status is judged to be reasonable and necessary in order to provide the required intensity of service to ensure the patient's safety. The patient's presenting symptoms, physical exam findings, and initial radiographic and laboratory data in the context of their medical condition is felt to place them at decreased risk for  further clinical deterioration. Furthermore, it is anticipated that the patient will be medically stable for discharge from the hospital within 2 midnights of admission.  ? ?Author: Anselm Jungling, DO ?09/20/2021 10:20 PM ? ?For on call review [email or web address].  ?

## 2021-09-21 ENCOUNTER — Observation Stay (HOSPITAL_COMMUNITY): Payer: Medicare Other

## 2021-09-21 DIAGNOSIS — Z20822 Contact with and (suspected) exposure to covid-19: Secondary | ICD-10-CM | POA: Diagnosis present

## 2021-09-21 DIAGNOSIS — R400 Somnolence: Secondary | ICD-10-CM | POA: Diagnosis present

## 2021-09-21 DIAGNOSIS — E785 Hyperlipidemia, unspecified: Secondary | ICD-10-CM

## 2021-09-21 DIAGNOSIS — F0280 Dementia in other diseases classified elsewhere without behavioral disturbance: Secondary | ICD-10-CM | POA: Diagnosis present

## 2021-09-21 DIAGNOSIS — T428X1A Poisoning by antiparkinsonism drugs and other central muscle-tone depressants, accidental (unintentional), initial encounter: Secondary | ICD-10-CM | POA: Diagnosis present

## 2021-09-21 DIAGNOSIS — Z91013 Allergy to seafood: Secondary | ICD-10-CM | POA: Diagnosis not present

## 2021-09-21 DIAGNOSIS — E119 Type 2 diabetes mellitus without complications: Secondary | ICD-10-CM | POA: Diagnosis present

## 2021-09-21 DIAGNOSIS — S92902S Unspecified fracture of left foot, sequela: Secondary | ICD-10-CM | POA: Diagnosis not present

## 2021-09-21 DIAGNOSIS — Y92099 Unspecified place in other non-institutional residence as the place of occurrence of the external cause: Secondary | ICD-10-CM | POA: Diagnosis not present

## 2021-09-21 DIAGNOSIS — S92902D Unspecified fracture of left foot, subsequent encounter for fracture with routine healing: Secondary | ICD-10-CM | POA: Diagnosis not present

## 2021-09-21 DIAGNOSIS — E876 Hypokalemia: Secondary | ICD-10-CM | POA: Diagnosis present

## 2021-09-21 DIAGNOSIS — G9340 Encephalopathy, unspecified: Secondary | ICD-10-CM | POA: Diagnosis present

## 2021-09-21 DIAGNOSIS — R8281 Pyuria: Secondary | ICD-10-CM | POA: Diagnosis present

## 2021-09-21 DIAGNOSIS — G2581 Restless legs syndrome: Secondary | ICD-10-CM | POA: Diagnosis present

## 2021-09-21 DIAGNOSIS — J449 Chronic obstructive pulmonary disease, unspecified: Secondary | ICD-10-CM | POA: Diagnosis present

## 2021-09-21 DIAGNOSIS — G928 Other toxic encephalopathy: Secondary | ICD-10-CM | POA: Diagnosis present

## 2021-09-21 DIAGNOSIS — I726 Aneurysm of vertebral artery: Secondary | ICD-10-CM | POA: Diagnosis present

## 2021-09-21 DIAGNOSIS — R4182 Altered mental status, unspecified: Secondary | ICD-10-CM | POA: Diagnosis not present

## 2021-09-21 DIAGNOSIS — W19XXXD Unspecified fall, subsequent encounter: Secondary | ICD-10-CM | POA: Diagnosis present

## 2021-09-21 DIAGNOSIS — G20 Parkinson's disease: Secondary | ICD-10-CM | POA: Diagnosis present

## 2021-09-21 DIAGNOSIS — G9341 Metabolic encephalopathy: Secondary | ICD-10-CM | POA: Diagnosis not present

## 2021-09-21 DIAGNOSIS — I10 Essential (primary) hypertension: Secondary | ICD-10-CM | POA: Diagnosis present

## 2021-09-21 DIAGNOSIS — R001 Bradycardia, unspecified: Secondary | ICD-10-CM | POA: Diagnosis present

## 2021-09-21 LAB — BASIC METABOLIC PANEL
Anion gap: 13 (ref 5–15)
Anion gap: 9 (ref 5–15)
BUN: 5 mg/dL — ABNORMAL LOW (ref 8–23)
BUN: <5 mg/dL — ABNORMAL LOW (ref 8–23)
CO2: 28 mmol/L (ref 22–32)
CO2: 28 mmol/L (ref 22–32)
Calcium: 9.2 mg/dL (ref 8.9–10.3)
Calcium: 9.4 mg/dL (ref 8.9–10.3)
Chloride: 99 mmol/L (ref 98–111)
Chloride: 104 mmol/L (ref 98–111)
Creatinine, Ser: 0.66 mg/dL (ref 0.44–1.00)
Creatinine, Ser: 0.64 mg/dL (ref 0.44–1.00)
GFR, Estimated: >60 mL/min (ref >60)
GFR, Estimated: >60 mL/min (ref >60)
Glucose, Bld: 104 mg/dL — ABNORMAL HIGH (ref 70–99)
Glucose, Bld: 97 mg/dL (ref 70–99)
Potassium: 2.7 mmol/L — CL (ref 3.5–5.1)
Potassium: 3.7 mmol/L (ref 3.5–5.1)
Sodium: 140 mmol/L (ref 135–145)
Sodium: 141 mmol/L (ref 135–145)

## 2021-09-21 LAB — GLUCOSE, CAPILLARY
Glucose-Capillary: 98 mg/dL (ref 70–99)
Glucose-Capillary: 95 mg/dL (ref 70–99)

## 2021-09-21 LAB — MAGNESIUM: Magnesium: 1.8 mg/dL (ref 1.7–2.4)

## 2021-09-21 LAB — RESP PANEL BY RT-PCR (FLU A&B, COVID) ARPGX2
Influenza A by PCR: NEGATIVE
Influenza B by PCR: NEGATIVE
SARS Coronavirus 2 by RT PCR: NEGATIVE

## 2021-09-21 LAB — FOLATE: Folate: 51.9 ng/mL (ref >5.9)

## 2021-09-21 IMAGING — DX DG ABD PORTABLE 1V
2 series · 2 of 2 positions shown · non-contrast
Comparison: None.

CLINICAL DATA: NG placement.  Altered mental status.

EXAM:
PORTABLE ABDOMEN - 1 VIEW

[abdomen kub (1 of 2)]
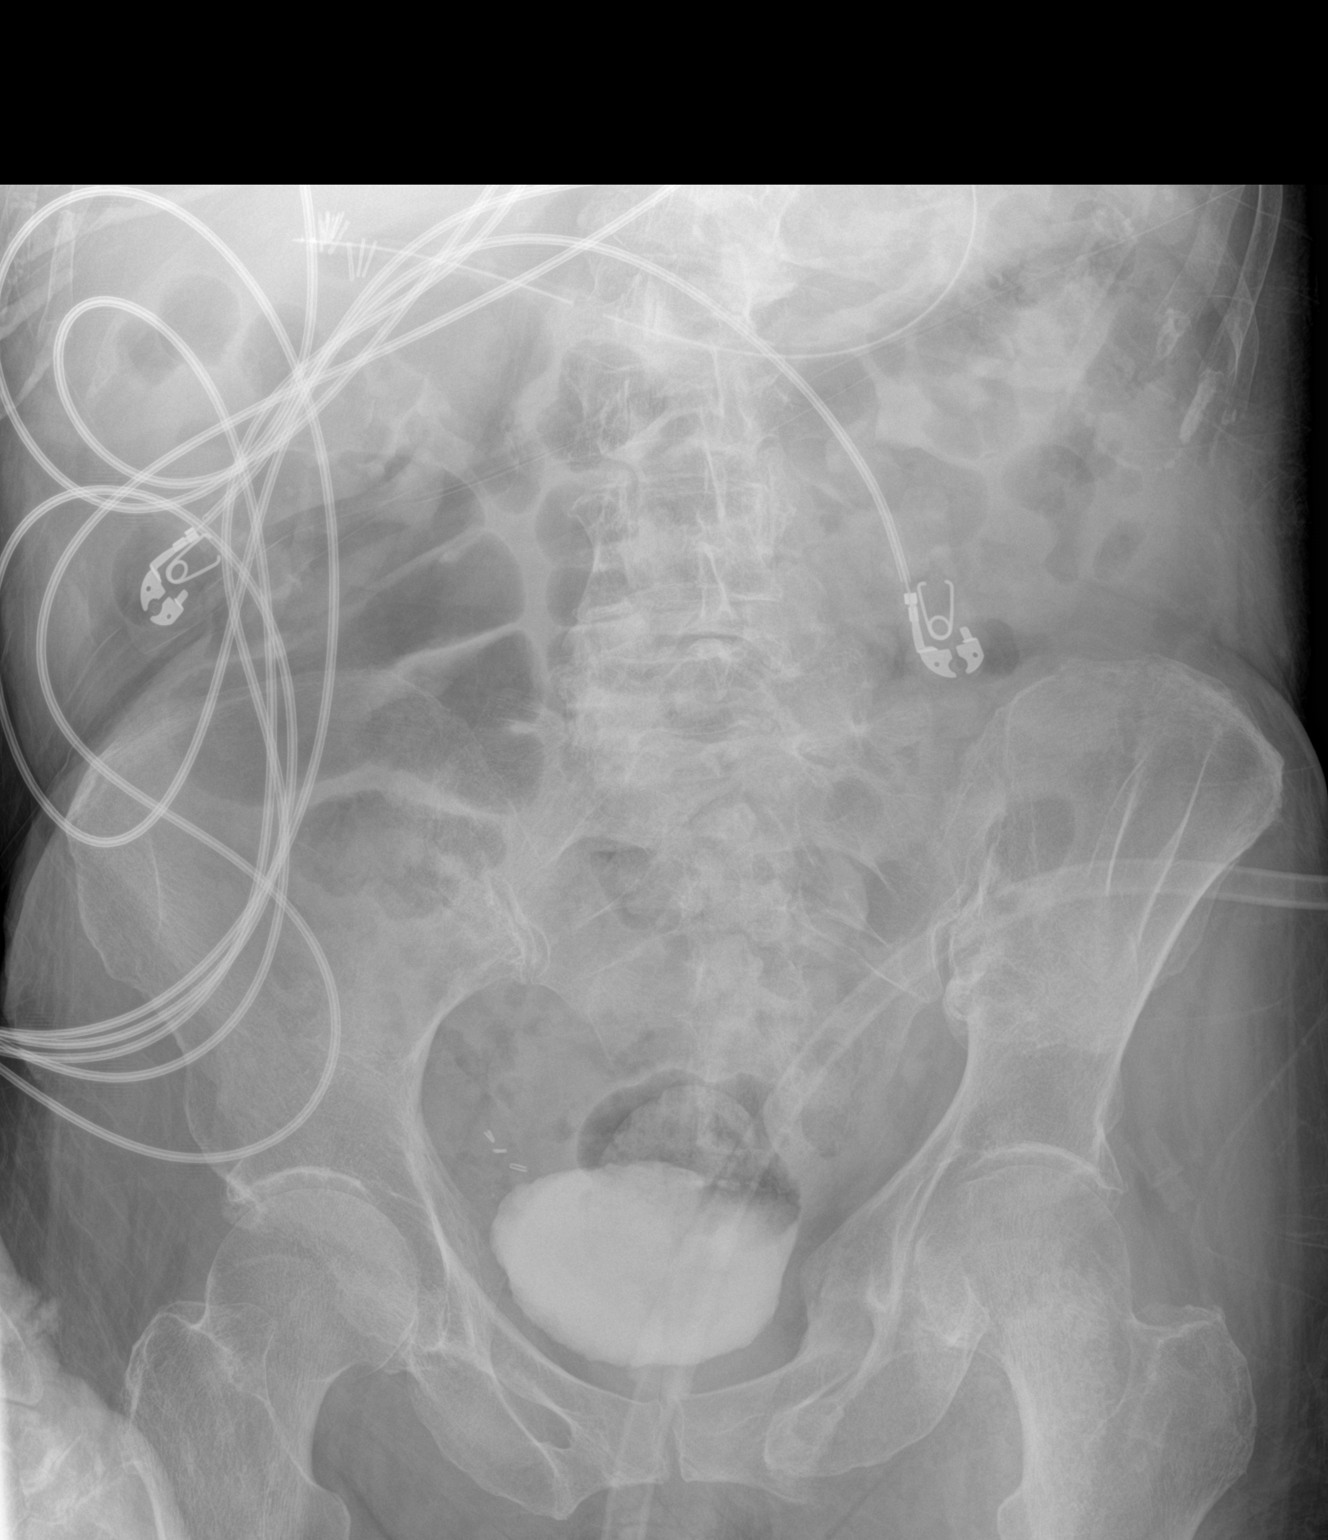

[abdomen kub (2 of 2)]
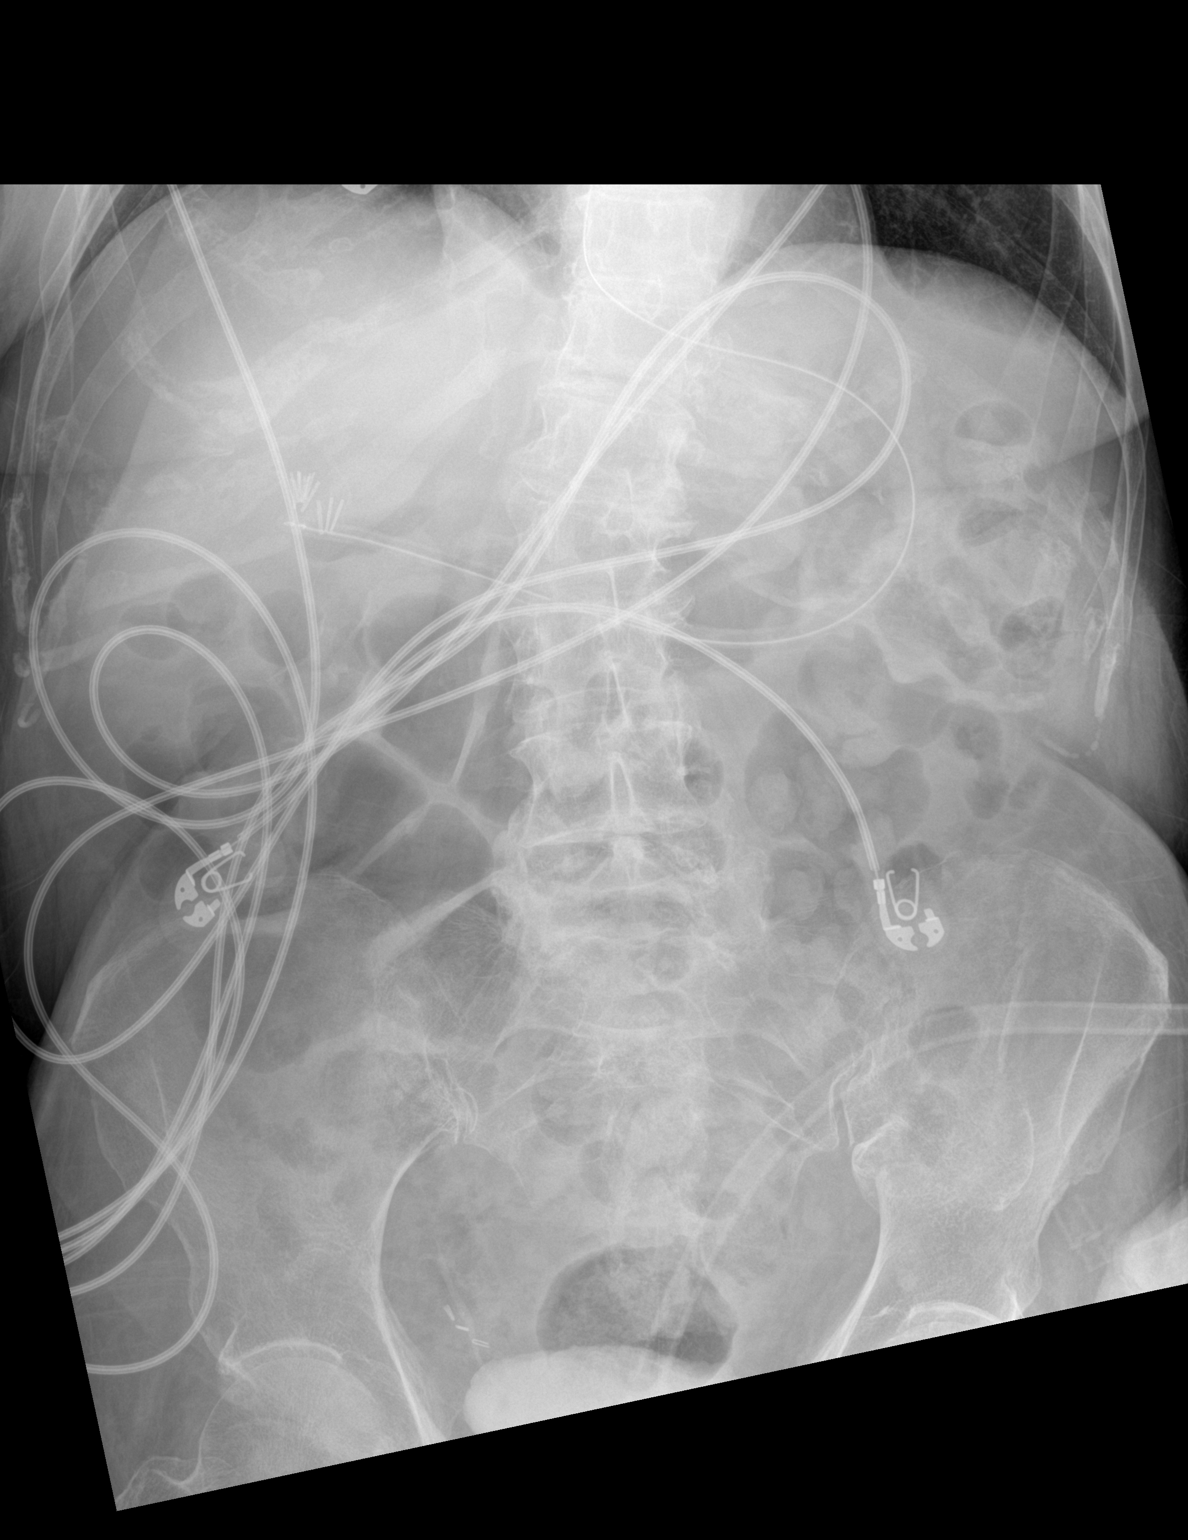

[2 of 2 positions shown; findings below may reference images not displayed]

FINDINGS: An enteric tube has been placed with tip in the right upper quadrant
consistent with location in the distal stomach. Surgical clips in
the right upper quadrant. Scattered gas and stool in the colon. No
small or large bowel distention. Residual contrast material seen in
the bladder. Degenerative changes in the spine and hips.
IMPRESSION: Enteric tube tip is in the right upper quadrant consistent with
location in the distal stomach.

## 2021-09-21 IMAGING — DX DG ABD PORTABLE 1V
1 series · 1 of 1 positions shown · non-contrast
Comparison: [GH] hours today.

CLINICAL DATA: 80-year-old female NG tube placement.

EXAM:
PORTABLE ABDOMEN - 1 VIEW

[abdomen]
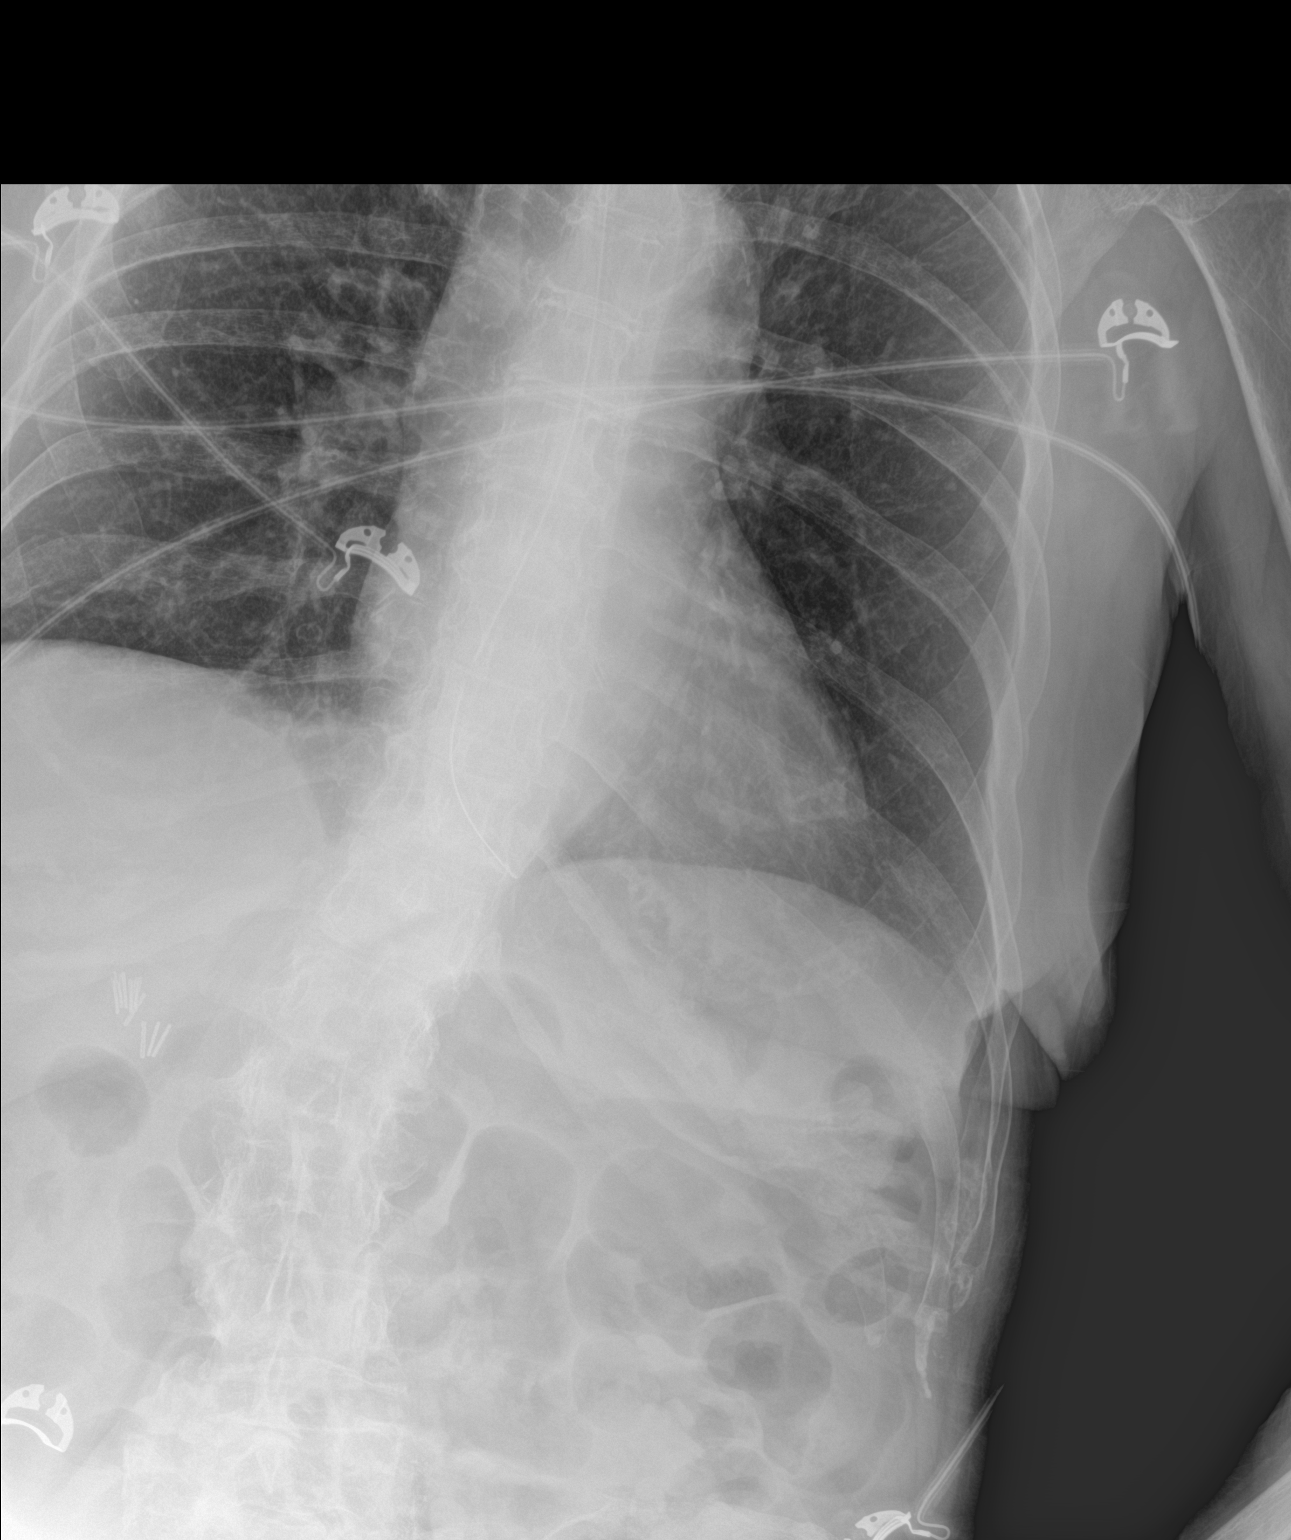

[1 of 1 positions shown; findings below may reference images not displayed]

FINDINGS: The enteric tube has been pulled back on this Portable AP semi
upright view at [GH] hours. The tip is now in the region of the GEJ
and the side hole is in the distal esophagus (arrow). Negative
visible chest. Stable cholecystectomy clips. Visible bowel-gas
within normal limits. Lumbar spine degeneration and dextroconvex
scoliosis.
IMPRESSION: Enteric tube has been pulled back, tip now at the GEJ and the side
hole in the distal esophagus.

Recommend advancing 12 centimeters to ensure adequate placement
within the stomach.

## 2021-09-21 MED ORDER — FLUTICASONE PROPIONATE 50 MCG/ACT NA SUSP
2.0000 | Freq: Every day | NASAL | Status: DC
  Filled 2021-09-21: qty 16

## 2021-09-21 MED ORDER — POTASSIUM CHLORIDE IN NACL 20-0.9 MEQ/L-% IV SOLN
INTRAVENOUS | Status: DC
  Filled 2021-09-21: qty 1000

## 2021-09-21 MED ORDER — UMECLIDINIUM BROMIDE 62.5 MCG/ACT IN AEPB
2.0000 | INHALATION_SPRAY | Freq: Every day | RESPIRATORY_TRACT | Status: DC
  Filled 2021-09-21: qty 7

## 2021-09-21 MED ORDER — MOMETASONE FURO-FORMOTEROL FUM 200-5 MCG/ACT IN AERO
2.0000 | INHALATION_SPRAY | Freq: Two times a day (BID) | RESPIRATORY_TRACT | Status: DC
  Filled 2021-09-21: qty 8.8

## 2021-09-21 MED ORDER — INSULIN ASPART 100 UNIT/ML IJ SOLN
0.0000 [IU] | Freq: Four times a day (QID) | INTRAMUSCULAR | Status: DC
  Administered 2021-09-22: 1 [IU] via SUBCUTANEOUS

## 2021-09-21 MED ORDER — MONTELUKAST SODIUM 10 MG PO TABS
10.0000 mg | ORAL_TABLET | Freq: Every day | ORAL | Status: DC
  Administered 2021-09-21 – 2021-09-22 (×2): 10 mg
  Filled 2021-09-21 (×2): qty 1

## 2021-09-21 MED ORDER — METOPROLOL TARTRATE 12.5 MG HALF TABLET
12.5000 mg | ORAL_TABLET | Freq: Two times a day (BID) | ORAL | Status: DC
  Administered 2021-09-21 – 2021-09-22 (×3): 12.5 mg via NASOGASTRIC
  Filled 2021-09-21 (×3): qty 1

## 2021-09-21 MED ORDER — VENLAFAXINE HCL 75 MG PO TABS
225.0000 mg | ORAL_TABLET | Freq: Every day | ORAL | Status: DC
  Administered 2021-09-22 – 2021-09-23 (×2): 225 mg via NASOGASTRIC
  Filled 2021-09-21: qty 3
  Filled 2021-09-21: qty 1
  Filled 2021-09-21 (×2): qty 3

## 2021-09-21 MED ORDER — ALBUTEROL SULFATE (2.5 MG/3ML) 0.083% IN NEBU: 2.5000 mg | INHALATION_SOLUTION | Freq: Four times a day (QID) | RESPIRATORY_TRACT | Status: DC | PRN

## 2021-09-21 MED ORDER — ALBUTEROL SULFATE HFA 108 (90 BASE) MCG/ACT IN AERS: 2.0000 | INHALATION_SPRAY | Freq: Four times a day (QID) | RESPIRATORY_TRACT | Status: DC | PRN

## 2021-09-21 MED ORDER — ATORVASTATIN CALCIUM 10 MG PO TABS
20.0000 mg | ORAL_TABLET | Freq: Every day | ORAL | Status: DC
  Administered 2021-09-21 – 2021-09-22 (×2): 20 mg via NASOGASTRIC
  Filled 2021-09-21 (×2): qty 2

## 2021-09-21 MED ORDER — DONEPEZIL HCL 10 MG PO TABS
5.0000 mg | ORAL_TABLET | Freq: Every day | ORAL | Status: DC
  Administered 2021-09-21: 5 mg via NASOGASTRIC
  Filled 2021-09-21: qty 1

## 2021-09-21 MED ORDER — POTASSIUM CHLORIDE 10 MEQ/100ML IV SOLN
10.0000 meq | INTRAVENOUS | Status: AC
  Administered 2021-09-21 (×6): 10 meq via INTRAVENOUS
  Filled 2021-09-21 (×2): qty 100

## 2021-09-21 NOTE — Progress Notes (Signed)
HOSPITAL MEDICINE OVERNIGHT EVENT NOTE   ? ?Notified by nursing that patient's potassium this morning is 2.7, down from 3.0 yesterday. ? ?Provide additional 60 mill equivalents of intravenous potassium chloride.  60 mill equivalents may not be enough to completely correct.  Will order repeat chemistry for 2 PM today.   ? ?Also note the patient's magnesium was 1.5 yesterday patient was when given 1 g of magnesium sulfate at that time.  We will add on and repeat magnesium to this morning's chemistry to ensure that this was adequately corrected. ? ?Marinda Elk  MD ?[facility]  ? ? ? ? ? ? ? ? ? ? ?

## 2021-09-21 NOTE — Progress Notes (Signed)
Neurology Progress Note ? ? ?S:// ?Patient is lying in bed with bilateral mitts on. Patient is lethargic, but easily arouses, however does drift back to sleep during exam needing constant stimulation. Patient is oriented to self and age. She does follow commands and moves all extremities. No family at bedside. RN at beside, reports no new neurological events ? ? ?O:// ?Current vital signs: ?BP (!) 128/101   Pulse 91   Temp 98.6 ?F (37 ?C) (Axillary)   Resp 14   Ht 5\' 5"  (1.651 m)   Wt 54 kg   SpO2 90%   BMI 19.81 kg/m?  ?Vital signs in last 24 hours: ?Temp:  [98 ?F (36.7 ?C)-98.6 ?F (37 ?C)] 98.6 ?F (37 ?C) (03/15 0700) ?Pulse Rate:  [51-91] 91 (03/15 0800) ?Resp:  [8-20] 14 (03/15 0800) ?BP: (119-191)/(52-109) 128/101 (03/15 0800) ?SpO2:  [90 %-100 %] 90 % (03/15 0800) ?Weight:  [54 kg-72.6 kg] 54 kg (03/15 0312) ? ?GENERAL: lethargic elderly woman lying in bed in NAd ?HEENT: - Normocephalic and atraumatic, dry mm ?LUNGS - Clear to auscultation bilaterally with no wheezes ?CV - S1S2 RRR, no m/r/g, equal pulses bilaterally. ?ABDOMEN - Soft, nontender, nondistended with normoactive BS ?Ext: warm, well perfused, intact peripheral pulses, no edema ? ?NEURO:  ?Mental Status: lethargic, easily arouses but drifts off during exam requiring stimulation to keep her awake. She is able to state her full name, and her age. She states it is February. Unable to identify place. Follows commands ?Language: speech appears to be clear.  She will not name objects ?Cranial Nerves: PERRL 3 mm/brisk. EOMI, visual fields with no blink to threat, corneals intact bilaterally, no facial asymmetry, facial sensation intact, hearing intact, tongue/uvula/soft palate midline, normal sternocleidomastoid and trapezius -unable to assess. No evidence of tongue atrophy or fibrillations ?Motor: all 4 extremities are antigravity with mild tremors noted. Bilateral lowers are rigid but are antigravity ?Sensation- Intact to light touch  bilaterally ?Coordination: unable to assess ?Gait- deferred ? ? ?Medications ? ?Current Facility-Administered Medications:  ?  carbidopa-levodopa (SINEMET IR) 25-100 MG per tablet immediate release 1 tablet, 1 tablet, Per Tube, QID, Tu, Ching T, DO, 1 tablet at 09/21/21 0853 ?  enoxaparin (LOVENOX) injection 40 mg, 40 mg, Subcutaneous, Q24H, Tu, Ching T, DO, 40 mg at 09/20/21 2338 ?  potassium chloride 10 mEq in 100 mL IVPB, 10 mEq, Intravenous, Q1 Hr x 6, Shalhoub, 2339, MD, Last Rate: 100 mL/hr at 09/21/21 0850, 10 mEq at 09/21/21 0850 ?Labs ?CBC ?   ?Component Value Date/Time  ? WBC 5.9 09/20/2021 1110  ? RBC 3.92 09/20/2021 1110  ? HGB 10.2 (L) 09/20/2021 1516  ? HCT 30.0 (L) 09/20/2021 1516  ? PLT 235 09/20/2021 1110  ? MCV 92.6 09/20/2021 1110  ? MCH 32.7 09/20/2021 1110  ? MCHC 35.3 09/20/2021 1110  ? RDW 12.8 09/20/2021 1110  ? LYMPHSABS 2.0 09/20/2021 1110  ? MONOABS 0.6 09/20/2021 1110  ? EOSABS 0.1 09/20/2021 1110  ? BASOSABS 0.0 09/20/2021 1110  ? ? ?CMP  ?   ?Component Value Date/Time  ? NA 140 09/21/2021 0454  ? K 2.7 (LL) 09/21/2021 0454  ? CL 99 09/21/2021 0454  ? CO2 28 09/21/2021 0454  ? GLUCOSE 104 (H) 09/21/2021 0454  ? BUN 5 (L) 09/21/2021 0454  ? CREATININE 0.66 09/21/2021 0454  ? CALCIUM 9.2 09/21/2021 0454  ? PROT 6.4 (L) 09/20/2021 1110  ? ALBUMIN 3.5 09/20/2021 1110  ? AST 21 09/20/2021 1110  ? ALT 7  09/20/2021 1110  ? ALKPHOS 72 09/20/2021 1110  ? BILITOT 0.6 09/20/2021 1110  ? GFRNONAA >60 09/21/2021 0454  ? ? ?glycosylated hemoglobin ? ?Lipid Panel  ?No results found for: CHOL, TRIG, HDL, CHOLHDL, VLDL, LDLCALC, LDLDIRECT ? ? ?Imaging ?I have reviewed images in epic and the results pertinent to this consultation are: ? ?CT-scan of the brain  3/14: ?No acute intracranial abnormality ? ?CTA neck 3/14:  ?1. Fusiform dilatation of the right V2, measuring up to 5 mm extending for approximately 5 mm at the level of C4. The right vertebral artery is otherwise unremarkable. ?2. No  hemodynamically significant stenosis in the neck ? ?MRI examination of the brain 3/14: ?1. No evidence of acute intracranial abnormality. ?2. 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 ?MRA head/Neck 3/14: ?No large vessel occlusion or proximal hemodynamically significant ?stenosis. ?Motion limited study without evidence of occlusion or visible high-grade stenosis. ?Approximately 5 x 3 mm outpouching arising from the right V2 vertebral artery, suspicious for aneurysm but not well characterized by motion limited MRA ? ?Assessment:  ?Alyssa Boyd is a 81 y.o. female with PMH significant for COPD, HTN, HLD, Parkinson's disease, OSA, GERD, diabetes type 2 who presents with somnolence and poor responsive. ?Patient's medication administration was just taken over by her Nursing home a couple days ago. She seems to have been prescribed baclofen TID PRN. It is unclear how often she was taking it in the past. Per the [facility] at bedside, she has been getting is scheduled TID for the last couple days. ? ?K 2.7 being replaced by Primary team  ?Resp panel not sure if has been sent  ? ?- TSH 3.015 ?- Vit B12 1392 ?- Folate 51.9 ?- Ammonia 35 ?- rEEG read pending  ? ?Impression: ?Acute metabolic encephalopathy due to unintentional baclofen overdose ? ?Recommendations: ?- continue to hold baclofen  ?- Replace electrolytes  ?- continue to monitor MS ?- Neurology will continue to follow  ? ?Alyssa Mart DNP, ACNPC-AG ? ? ?I have seen the patient reviewed the above note.  I saw her in the afternoon, and she was significantly more awake than what I see described previously.   ? ?She was resting when I entered the room, but on calling her name she immediately responded without opening her eyes, when I asked her to open her eyes, however, she opened them and was able to engage with me.  She answered simple questions, but was not oriented.  She followed commands readily. ? ?My suspicion is that this was related to baclofen  which is gradually clearing.  Another possibility would be that she has a mild hypoactive delirium related to environmental change with her recent move to the nursing home.  At this point, I think conservative measures be prudent, and given her improvement this afternoon and nothing there is any further need for further work-up.  I would continue holding baclofen. ? ?Continue her home anti-Parkinson's regimen, neurology will be available on an as-needed basis. ? ?Ritta Slot, MD ?[facility] ?[phone] ? ?If 7pm- 7am, please page neurology on call as listed in AMION. ? ? ?  ?

## 2021-09-21 NOTE — Social Work (Signed)
CSW spoke with pt son to confirm pt is from [facility] and asked about transportation. Pt son does not think pt will need PTAR but will follow up with CSW if the DC plan change. CSW will continue to follow for DC planning needs. ?

## 2021-09-21 NOTE — Progress Notes (Addendum)
?PROGRESS NOTE ?Alyssa Boyd  DPO:[id number] DOB: Oct 22, 1940 DOA: 09/20/2021 ?PCP: Joycelyn Man, NP  ? ?Brief Narrative/Hospital Course: ?81 y.o. female with medical history significant of Parkinson's disease, asthma, hypertension, type 2 diabetes, OSA, restless leg syndrome, GERD, recent left foot fracture admitted with altered mental status.  ?Per report she moved here last week from [place] to be closer to her grandchildren.  She initially lived with her daughter and then moved into [facility] 4 days PTA with her husband. Husband was giving her baclofen PRN rather than the prescribed TID,however facility was not aware of this and started giving it to her regularly 2 days PTA and she became more fatigued and eventually was completely lethargic/completely bedbound.At baseline, patient can walk and feed herself but needs help with her other ADLs.   ?In the ED, vitals stable with bradycardia mild hypertension, labs showed hypokalemia hypomagnesemia   ?CT of the head was negative.  MRI brain was negative.  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  CTA neck showed fusiform dilatation of the right V2 measuring up to 5 mm extending for approximately 5 mm at the level of C4.  Right vertebral artery is otherwise unremarkable.  No significant stenosis in the neck. She was also noted to have some slight twitching of her tongue concerning for seizure activity.  Neurology was consulted and admitted. ?Potassium was replaced, EEG was ordered folate.  B12 390, TSH 3, UA WBC 6-10 negative for nitrite and leukocytes ?  ?Subjective: ?Seen and examined this morning.  Nursing reports earlier patient was able to move her extremities on my exam lethargic minimally responsive makes some sounds/grimaces with pain ?NG tube in place that got dislodged during night and was replaced ? ?Assessment and Plan: ? ?Principal Problem: ?  Acute metabolic encephalopathy ?Active Problems: ?  Parkinson  disease (HCC) ?  HTN (hypertension) ?  Type 2 diabetes mellitus (HCC) ?  Aneurysm of vertebral artery (HCC) ?  Foot fracture, left, sequela ?  Hypokalemia ?  Hypomagnesemia ?  COPD (chronic obstructive pulmonary disease) (HCC) ?  Hyperlipidemia ?  ?Acute metabolic encephalopathy: So far unremarkable CTA neck CT head MRI brain MRA to explain.  EEG shows moderate to severe encephalopathy, no definite seizure activity.  Patient remains encephalopathic.  Continue fall precaution supportive care delirium precaution, continue NG tube in place and continue her home sinemet. patient previously on baclofen PRN but moved to a new facility and was given scheduled Baclofen TID and symptoms correlates with the timeline-likely the etiology.  Neurology following closely.  We will resume her ? ?Parkinson disease: NG tube placement per neurology to receive her Sinemet ? ?Hypomagnesemia: Resolved ?Hypokalemia: Being replaced again IV this morning repeat this afternoon. ? ?Foot fracture, left, sequela: has inversion of her left foot at baseline due to Parkinson's and had a non-traumatic fracture this week. She is currently following [facility] outpatient to decide on definite treatment. Daughter will bring back her boot for immobilization while inpatient.  ? ?Aneurysm of vertebral artery: ?Noted on  CTA neck In ED-  measuring up to 5 mm extending for approximately 5 mm at the level of C4.follow up with neurosurgery outpatient for monitoring  ? ?Type 2 diabetes mellitus: Check SSI ? ?HTN: bp is fairly controlled.  Resume her metoprolol through NG tube.  Continue to hold her Micardis. ? ?Pyuria mild in urine, send urine for culture ? ?Hyperlipidemia statin on hold ?COPD resume her inhaler. ? ?DVT prophylaxis: enoxaparin (LOVENOX) injection 40 mg  Start: 09/20/21 2200 ?Code Status:   Code Status: Full Code ?Family Communication: plan of care discussed with patient/RN at bedside. ?Updated daughter on phone- per her she is more responsive  today. ? ?Disposition: Currently not medically stable for discharge. ?Status is: admitted as observation ?The patient will require care spanning > 2 midnights and should be moved to inpatient because: For ongoing management of acute metabolic encephalopathy, severe hypokalemia, unable ot take po and with NGT in place. ? ?Objective: ?Vitals last 24 hrs: ?Vitals:  ? 09/21/21 0300 09/21/21 0312 09/21/21 0700 09/21/21 0800  ?BP: (!) 135/52  (!) 151/89 (!) 128/101  ?Pulse: 65  87 91  ?Resp: 14  17 14   ?Temp: 98.2 ?F (36.8 ?C)  98.6 ?F (37 ?C)   ?TempSrc: Axillary  Axillary   ?SpO2: 93%  92% 90%  ?Weight:  54 kg    ?Height:      ? ?Weight change:  ? ?Physical Examination: ?General exam: Lethargic barely able to open eyes NG tube in place grimaces with pain  ?HEENT:Oral mucosa moist, Ear/Nose WNL grossly, dentition normal. ?Respiratory system: bilaterally diminished BS, no use of accessory muscle ?Cardiovascular system: S1 & S2 +, No JVD,. ?Gastrointestinal system: Abdomen soft,NT,ND, BS+ ?Nervous System: Lethargic minimally responsive.   ?Extremities: LE edema none,distal peripheral pulses palpable.  ?Skin: No rashes,no icterus. ?MSK: Normal muscle bulk,tone, power ? ?Medications reviewed:  ?Scheduled Meds: ? atorvastatin  20 mg Per NG tube Daily  ? carbidopa-levodopa  1 tablet Per Tube QID  ? donepezil  5 mg Per NG tube QHS  ? enoxaparin (LOVENOX) injection  40 mg Subcutaneous Q24H  ? fluticasone  2 spray Each Nare QHS  ? metoprolol tartrate  12.5 mg Per NG tube BID  ? mometasone-formoterol  2 puff Inhalation BID  ? montelukast  10 mg Per Tube QHS  ? Tiotropium Bromide Monohydrate  2 puff Inhalation Daily  ? venlafaxine  225 mg Per NG tube Daily  ?Continuous Infusions: ? potassium chloride 10 mEq (09/21/21 0956)  ?  ?Diet Order   ? ?       ?  Diet NPO time specified  Diet effective now       ?  ? ?  ?  ? ?  ? ? ?No intake or output data in the 24 hours ending 09/21/21 1104 ?Net IO Since Admission: No IO data has been  entered for this period [09/21/21 1104]  ?Wt Readings from Last 3 Encounters:  ?09/21/21 54 kg  ?  ? ?Unresulted Labs (From admission, onward)  ? ?  Start     Ordered  ? 09/21/21 1400  Basic metabolic panel  Once,   R       ? 09/21/21 0615  ? 09/21/21 1056  Urine Culture  Add-on,   AD       ?Question:  Indication  Answer:  Altered mental status (if no other cause identified)  ? 09/21/21 1056  ? 09/21/21 1001  Resp Panel by RT-PCR (Flu A&B, Covid) Nasopharyngeal Swab  Once,   R       ? 09/21/21 1000  ? 09/20/21 2120  Methylmalonic acid, serum  Once,   R       ?Question:  Release to patient  Answer:  Immediate  ? 09/20/21 2119  ? 09/20/21 2025  Resp Panel by RT-PCR (Flu A&B, Covid) Nasopharyngeal Swab  (Tier 2 - Symptomatic/asymptomatic)  Once,   R       ? 09/20/21 2024  ? 09/20/21  1110  Culture, blood (Routine x 2)  BLOOD CULTURE X 2,   STAT     ? 09/20/21 1110  ? ?  ?  ? ?  ?Data Reviewed: I have personally reviewed following labs and imaging studies ?CBC: ?Recent Labs  ?Lab 09/20/21 ?1110 09/20/21 ?1516  ?WBC 5.9  --   ?NEUTROABS 3.2  --   ?HGB 12.8 10.2*  ?HCT 36.3 30.0*  ?MCV 92.6  --   ?PLT 235  --   ? ?Basic Metabolic Panel: ?Recent Labs  ?Lab 09/20/21 ?1110 09/20/21 ?1500 09/20/21 ?1516 09/21/21 ?0454  ?NA 142  --  141 140  ?K 2.8*  --  3.0* 2.7*  ?CL 100  --   --  99  ?CO2 33*  --   --  28  ?GLUCOSE 115*  --   --  104*  ?BUN 5*  --   --  5*  ?CREATININE 0.74  --   --  0.66  ?CALCIUM 9.9  --   --  9.2  ?MG  --  1.5*  --  1.8  ? ?Recent Labs  ?Lab 09/20/21 ?1110  ?AST 21  ?ALT 7  ?ALKPHOS 72  ?BILITOT 0.6  ?PROT 6.4*  ?ALBUMIN 3.5  ? ?No results for input(s): LIPASE, AMYLASE in the last 168 hours. ?Recent Labs  ?Lab 09/20/21 ?1534  ?AMMONIA 35  ? ?Coagulation Profile: ?Recent Labs  ?Lab 09/20/21 ?1110  ?INR 1.1  ? ?Recent Labs  ?  09/20/21 ?2200  ?TSH 3.015  ? ?Anemia Panel: ?Recent Labs  ?  09/20/21 ?2200  ?[id number] 4,034VITAMINB12 1,392*  ?FOLATE 51.9  ? ?Sepsis Labs: ?Recent Labs  ?Lab 09/20/21 ?1110 09/20/21 ?1500   ?LATICACIDVEN 1.9 1.7  ? ? ?No results found for this or any previous visit (from the past 240 hour(s)).  ?Antimicrobials: ?Anti-infectives (From admission, onward)  ? ? None  ? ?  ? ?Culture/Microbiology ?No res

## 2021-09-21 NOTE — Hospital Course (Addendum)
81 y.o. female with medical history significant of Parkinson's disease, asthma, hypertension, type 2 diabetes, OSA, restless leg syndrome, GERD, recent left foot fracture admitted with altered mental status.  ?Per report she moved here last week from [place] to be closer to her grandchildren.  She initially lived with her daughter and then moved into [facility] facility 4 days PTA with her husband. Husband was giving her baclofen PRN rather than the prescribed TID,however facility was not aware of this and started giving it to her regularly 2 days PTA and she became more fatigued and eventually was completely lethargic/completely bedbound.At baseline, patient can walk and feed herself but needs help with her other ADLs.   ?In the ED, vitals stable with bradycardia mild hypertension, labs showed hypokalemia hypomagnesemia   ?CT of the head was negative.  MRI brain was negative.  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  CTA neck showed fusiform dilatation of the right V2 measuring up to 5 mm extending for approximately 5 mm at the level of C4.  Right vertebral artery is otherwise unremarkable.  No significant stenosis in the neck. She was also noted to have some slight twitching of her tongue concerning for seizure activity.  Neurology was consulted and admitted. ?Potassium was replaced, EEG was ordered folate.  B12 390, TSH 3, UA WBC 6-10 negative for nitrite and leukocytes ? ?

## 2021-09-21 NOTE — Procedures (Signed)
Patient Name: Alyssa Boyd  ?MRN: [id number]  ?Epilepsy Attending: Charlsie Quest  ?Referring Physician/Provider: Erick Blinks, MD ?Date: 09/20/2021 ?Duration: 24.50 mins ? ?Patient history: 81 y.o. female with PMH significant for COPD, HTN, HLD, Parkinson's disease, OSA, GERD, diabetes type 2 who presents with somnolence and poor responsive. EEG to evaluate for seizure.  ? ?Level of alertness: Awake ? ?AEDs during EEG study: None ? ?Technical aspects: This EEG study was done with scalp electrodes positioned according to the 10-20 International system of electrode placement. Electrical activity was acquired at a sampling rate of 500Hz  and reviewed with a high frequency filter of 70Hz  and a low frequency filter of 1Hz . EEG data were recorded continuously and digitally stored.  ? ?Description: No clear posterior dominant rhythm was seen. EEG showed continuous generalized 3 to 6 Hz theta and delta slowing. Generalized periodic discharges with triphasic morphology at 1 to 2 Hz were also noted. ?Hyperventilation and photic stimulation were not performed.    ? ?ABNORMALITY ?- Periodic discharges with triphasic morphology, generalized ( GPDs) ?- Continuous slow, generalized ? ?IMPRESSION: ?This study is suggestive of moderate to severe diffuse encephalopathy, nonspecific etiology but likely secondary to toxic-metabolic causes.  Of note generalized periodic discharges with triphasic morphology can be on the ictal-interictal continuum.  However the morphology and frequency of discharges during the study is more likely suggestive of toxic-metabolic causes.  No seizures or definite epileptiform discharges were seen throughout the recording. ? ?  ? ?

## 2021-09-21 NOTE — TOC Progression Note (Signed)
Transition of Care (TOC) - Progression Note  ? ? ?Patient Details  ?Name: Alyssa Boyd ?MRN: [id number] ?Date of Birth: 11/07/1940 ? ?Transition of Care (TOC) CM/SW Contact  ?Jaylene Schrom Wynn Banker, LCSWA ?Phone Number: ?09/21/2021, 3:58 PM ? ?Clinical Narrative:    ? ?Transition of Care (TOC) Screening Note ? ? ?Patient Details  ?Name: Alyssa Boyd ?Date of Birth: Aug 21, 1940 ? ? ?Transition of Care (TOC) CM/SW Contact:    ?Ivette Loyal, LCSWA ?Phone Number: ?09/21/2021, 3:58 PM ? ? ? ?Transition of Care Department [facility]) has reviewed patient and no TOC needs have been identified at this time. We will continue to monitor patient advancement through interdisciplinary progression rounds. If new patient transition needs arise, please place a TOC consult. ?  ? ? ?  ?  ? ?Expected Discharge Plan and Services ?  ?  ?  ?  ?  ?                ?  ?  ?  ?  ?  ?  ?  ?  ?  ?  ? ? ?Social Determinants of Health (SDOH) Interventions ?  ? ?Readmission Risk Interventions ?No flowsheet data found. ? ?

## 2021-09-22 DIAGNOSIS — R4182 Altered mental status, unspecified: Secondary | ICD-10-CM | POA: Diagnosis not present

## 2021-09-22 LAB — CBC
HCT: 28.7 % — ABNORMAL LOW (ref 36.0–46.0)
Hemoglobin: 9.7 g/dL — ABNORMAL LOW (ref 12.0–15.0)
MCH: 32.2 pg (ref 26.0–34.0)
MCHC: 33.8 g/dL (ref 30.0–36.0)
MCV: 95.3 fL (ref 80.0–100.0)
Platelets: 173 10*3/uL (ref 150–400)
RBC: 3.01 MIL/uL — ABNORMAL LOW (ref 3.87–5.11)
RDW: 12.7 % (ref 11.5–15.5)
WBC: 4.7 10*3/uL (ref 4.0–10.5)
nRBC: 0 % (ref 0.0–0.2)

## 2021-09-22 LAB — URINE CULTURE: Culture: <10000 — AB

## 2021-09-22 LAB — GLUCOSE, CAPILLARY
Glucose-Capillary: 132 mg/dL — ABNORMAL HIGH (ref 70–99)
Glucose-Capillary: 163 mg/dL — ABNORMAL HIGH (ref 70–99)
Glucose-Capillary: 77 mg/dL (ref 70–99)
Glucose-Capillary: 87 mg/dL (ref 70–99)
Glucose-Capillary: 92 mg/dL (ref 70–99)

## 2021-09-22 LAB — BASIC METABOLIC PANEL
Anion gap: 6 (ref 5–15)
BUN: <5 mg/dL — ABNORMAL LOW (ref 8–23)
CO2: 22 mmol/L (ref 22–32)
Calcium: 7.7 mg/dL — ABNORMAL LOW (ref 8.9–10.3)
Chloride: 114 mmol/L — ABNORMAL HIGH (ref 98–111)
Creatinine, Ser: 0.5 mg/dL (ref 0.44–1.00)
GFR, Estimated: >60 mL/min (ref >60)
Glucose, Bld: 79 mg/dL (ref 70–99)
Potassium: 6.2 mmol/L — ABNORMAL HIGH (ref 3.5–5.1)
Sodium: 142 mmol/L (ref 135–145)

## 2021-09-22 LAB — POTASSIUM: Potassium: 3.7 mmol/L (ref 3.5–5.1)

## 2021-09-22 MED ORDER — ASPIRIN EC 81 MG PO TBEC
81.0000 mg | DELAYED_RELEASE_TABLET | Freq: Every day | ORAL | Status: DC
  Administered 2021-09-22 – 2021-09-23 (×2): 81 mg via ORAL
  Filled 2021-09-22 (×2): qty 1

## 2021-09-22 MED ORDER — ORAL CARE MOUTH RINSE
15.0000 mL | Freq: Two times a day (BID) | OROMUCOSAL | Status: DC
  Administered 2021-09-22: 15 mL via OROMUCOSAL

## 2021-09-22 MED ORDER — ACETAMINOPHEN 325 MG PO TABS
650.0000 mg | ORAL_TABLET | Freq: Four times a day (QID) | ORAL | Status: DC | PRN
  Administered 2021-09-23: 650 mg
  Filled 2021-09-22: qty 2

## 2021-09-22 MED ORDER — ROPINIROLE HCL 1 MG PO TABS
8.0000 mg | ORAL_TABLET | Freq: Every day | ORAL | Status: DC
  Administered 2021-09-22: 8 mg via ORAL
  Filled 2021-09-22: qty 8

## 2021-09-22 MED ORDER — DONEPEZIL HCL 10 MG PO TABS
5.0000 mg | ORAL_TABLET | Freq: Every day | ORAL | Status: DC
Start: 2021-09-22 — End: 2021-09-23
  Administered 2021-09-22: 5 mg via ORAL
  Filled 2021-09-22: qty 1

## 2021-09-22 MED ORDER — CARBIDOPA-LEVODOPA 25-100 MG PO TABS
1.0000 | ORAL_TABLET | Freq: Four times a day (QID) | ORAL | Status: DC
  Administered 2021-09-22 – 2021-09-23 (×4): 1 via ORAL
  Filled 2021-09-22 (×5): qty 1

## 2021-09-22 MED ORDER — FUROSEMIDE 20 MG PO TABS
20.0000 mg | ORAL_TABLET | Freq: Two times a day (BID) | ORAL | Status: DC
  Administered 2021-09-22 – 2021-09-23 (×2): 20 mg via ORAL
  Filled 2021-09-22 (×2): qty 1

## 2021-09-22 MED ORDER — METOPROLOL TARTRATE 25 MG PO TABS
37.5000 mg | ORAL_TABLET | Freq: Once | ORAL | Status: AC
  Administered 2021-09-22: 37.5 mg via ORAL
  Filled 2021-09-22: qty 1

## 2021-09-22 MED ORDER — CHLORHEXIDINE GLUCONATE 0.12 % MT SOLN
15.0000 mL | Freq: Two times a day (BID) | OROMUCOSAL | Status: DC
  Administered 2021-09-22 – 2021-09-23 (×2): 15 mL via OROMUCOSAL
  Filled 2021-09-22 (×3): qty 15

## 2021-09-22 MED ORDER — METOPROLOL TARTRATE 50 MG PO TABS
50.0000 mg | ORAL_TABLET | Freq: Two times a day (BID) | ORAL | Status: DC
  Administered 2021-09-22 – 2021-09-23 (×2): 50 mg via ORAL
  Filled 2021-09-22 (×3): qty 1

## 2021-09-22 MED ORDER — ATORVASTATIN CALCIUM 10 MG PO TABS
20.0000 mg | ORAL_TABLET | Freq: Every day | ORAL | Status: DC
  Administered 2021-09-23: 20 mg via ORAL
  Filled 2021-09-22: qty 2

## 2021-09-22 NOTE — Progress Notes (Addendum)
?PROGRESS NOTE ?Alyssa LoosenBetty Boyd  ZOX:[id number] DOB: 06-30-1941 DOA: 09/20/2021 ?PCP: Alyssa ManFreeman, Alyssa James, NP  ? ?Brief Narrative/Hospital Course: ?81 y.o. female with medical history significant of Parkinson's disease, asthma, hypertension, type 2 diabetes, OSA, restless leg syndrome, GERD, recent left foot fracture admitted with altered mental status.  ?Per [place] grandchildren.  She initially lived with her daughter and then moved into [facility] 4 days PTA with her husband. Husband was giving her baclofen PRN rather than the prescribed TID,however facility was not aware of this and started giving it to her regularly 2 days PTA and she became more fatigued and eventually was completely lethargic/completely bedbound.At baseline, patient can walk and feed herself but needs help with her other ADLs.   ?In the ED, vitals stable with bradycardia mild hypertension, labs showed hypokalemia hypomagnesemia   ?CT of the head was negative.  MRI brain was negative.  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  CTA neck showed fusiform dilatation of the right V2 measuring up to 5 mm extending for approximately 5 mm at the level of C4.  Right vertebral artery is otherwise unremarkable.  No significant stenosis in the neck. She was also noted to have some slight twitching of her tongue concerning for seizure activity.  Neurology was consulted and admitted. ?Potassium was replaced, EEG was ordered folate.  B12 390, TSH 3, UA WBC 6-10 negative for nitrite and leukocytes ?  ?Subjective: ?Seen and examined this morning NG got pulled out, patient is much more alert awake interactive, daughter at the bedside ?Overnight afebrile, labs this morning showed high potassium but recheck done after stopping IV fluids and was normal. ?Patient had leg pain overnight and given Tylenol ? ?Assessment and Plan: ? ?Principal Problem: ?  Acute metabolic  encephalopathy ?Active Problems: ?  Parkinson disease (HCC) ?  HTN (hypertension) ?  Type 2 diabetes mellitus (HCC) ?  Aneurysm of vertebral artery (HCC) ?  Foot fracture, left, sequela ?  Hypokalemia ?  Hypomagnesemia ?  COPD (chronic obstructive pulmonary disease) (HCC) ?  Hyperlipidemia ?  Acute encephalopathy ? ?Acute toxic metabolic encephalopathy: Likely from baclofen use.So far unremarkable CTA neck CT head MRI brain MRA to explain.  EEG shows moderate to severe encephalopathy, no definite seizure activity.  Mentation significantly improved, will try swallow eval/pill.  Obtain PT OT evaluation. patient previously on baclofen PRN but moved to a new facility and was given scheduled Baclofen TID and symptoms correlates with the timeline.  Appreciate neurology input ? ?Parkinson disease: Continue p.o. Sinemet  ? ?Hypomagnesemia: Resolved ?Hypokalemia: Resolved.  Falsely reviewed potassium normal on recheck  ? ?Foot fracture, left, sequela: has inversion of her left foot at baseline due to Parkinson's and had a non-traumatic fracture this week. She is currently following [facility] outpatient to decide on definite treatment. Daughter will bring back her boot for immobilization while inpatient.  ? ?Aneurysm of vertebral artery: ?Noted on  CTA neck In ED-  measuring up to 5 mm extending for approximately 5 mm at the level of C4.follow up with neurosurgery outpatient for monitoring  ? ?Type 2 diabetes mellitus: Blood sugar stable.   ?Recent Labs  ?Lab 09/21/21 ?1126 09/21/21 ?1557 09/22/21 ?0005 09/22/21 ?0615  ?GLUCAP 98 95 87 77  ?  ?HTN: Borderline controlled, resume metoprolol home dose.Continue to hold her Micardis. ? ?Pyuria mild in urine, send urine for culture.  Patient denies any urinary complaints this morning ? ?  Hyperlipidemia resume statin  ?COPD cont her inhaler. ? ?Deconditioning/debility: We will mobilize as able, PT OT consulted ?DVT prophylaxis: enoxaparin (LOVENOX) injection 40 mg Start: 09/20/21  2200 ?Code Status:   Code Status: Full Code ?Family Communication: plan of care discussed with patient/RN at bedside. ?Updated daughter on phone- per her she is more responsive today. ? ?Disposition: Currently not medically stable for discharge. ?Status is: Inpatient ?Remains inpatient for ongoing management of patient's encephalopathy deconditioning debility ?Anticipated discharge tomorrow back to [facility] ? ?Objective: ?Vitals last 24 hrs: ?Vitals:  ? 09/21/21 1938 09/22/21 0009 09/22/21 0416 09/22/21 0755  ?BP: (!) 164/78 (!) 153/66 (!) 137/57 (!) 159/61  ?Pulse: 97 80 74 64  ?Resp: 16 15 15 13   ?Temp: 98.4 ?F (36.9 ?C) 98.2 ?F (36.8 ?C) 97.9 ?F (36.6 ?C) 97.9 ?F (36.6 ?C)  ?TempSrc: Oral Oral Axillary Oral  ?SpO2: 95% 95% 95% 96%  ?Weight:      ?Height:      ? ?Weight change:  ? ?Physical Examination: ?General exam: AA oriented, older than stated age, weak appearing. ?HEENT:Oral mucosa moist, Ear/Nose WNL grossly, dentition normal. ?Respiratory system: bilaterally diminished, no use of accessory muscle ?Cardiovascular system: S1 & S2 +, No JVD,. ?Gastrointestinal system: Abdomen soft,NT,ND,BS+ ?Nervous System:Alert, awake, moving extremities and grossly nonfocal ?Extremities: LE ankle edema none, distal peripheral pulses palpable.  Tender left ankle ?Skin: No rashes,no icterus. ?MSK: Normal muscle bulk,tone, power  ? ?Medications reviewed:  ?Scheduled Meds: ? atorvastatin  20 mg Per NG tube Daily  ? carbidopa-levodopa  1 tablet Per Tube QID  ? chlorhexidine  15 mL Mouth Rinse BID  ? donepezil  5 mg Per NG tube QHS  ? enoxaparin (LOVENOX) injection  40 mg Subcutaneous Q24H  ? fluticasone  2 spray Each Nare QHS  ? insulin aspart  0-6 Units Subcutaneous Q6H  ? mouth rinse  15 mL Mouth Rinse q12n4p  ? metoprolol tartrate  12.5 mg Per NG tube BID  ? mometasone-formoterol  2 puff Inhalation BID  ? montelukast  10 mg Per Tube QHS  ? umeclidinium bromide  2 puff Inhalation Daily  ? venlafaxine  225 mg Per NG tube Daily   ?Continuous Infusions: ? ?  ?Diet Order   ? ?       ?  Diet NPO time specified  Diet effective now       ?  ? ?  ?  ? ?  ? ? ? ?Intake/Output Summary (Last 24 hours) at 09/22/2021 0810 ?Last data filed at 09/22/2021 0416 ?Gross per 24 hour  ?Intake 1256.83 ml  ?Output 1100 ml  ?Net 156.83 ml  ? ?Net IO Since Admission: 156.83 mL [09/22/21 0810]  ?Wt Readings from Last 3 Encounters:  ?09/21/21 54 kg  ?  ? ?Unresulted Labs (From admission, onward)  ? ?  Start     Ordered  ? 09/22/21 0500  Basic metabolic panel  Daily,   R     ?Question:  Specimen collection method  Answer:  Lab=Lab collect  ? 09/21/21 1105  ? 09/22/21 0500  CBC  Daily,   R     ?Question:  Specimen collection method  Answer:  Lab=Lab collect  ? 09/21/21 1105  ? 09/21/21 1056  Urine Culture  Add-on,   AD       ?Question:  Indication  Answer:  Altered mental status (if no other cause identified)  ? 09/21/21 1056  ? 09/20/21 2120  Methylmalonic acid, serum  Once,   R       ?  Question:  Release to patient  Answer:  Immediate  ? 2021-09-25 2119  ? 25-Sep-2021 2025  Resp Panel by RT-PCR (Flu A&B, Covid) Nasopharyngeal Swab  (Tier 2 - Symptomatic/asymptomatic)  Once,   R       ? 2021-09-25 2024  ? ?  ?  ? ?  ?Data Reviewed: I have personally reviewed following labs and imaging studies ?CBC: ?Recent Labs  ?Lab 25-Sep-2021 ?1110 Sep 25, 2021 ?1516 09/22/21 ?0216  ?WBC 5.9  --  4.7  ?NEUTROABS 3.2  --   --   ?HGB 12.8 10.2* 9.7*  ?HCT 36.3 30.0* 28.7*  ?MCV 92.6  --  95.3  ?PLT 235  --  173  ? ? ?Basic Metabolic Panel: ?Recent Labs  ?Lab 25-Sep-2021 ?1110 09-25-21 ?1500 09/25/21 ?1516 09/21/21 ?0454 09/21/21 ?1330 09/22/21 ?0216 09/22/21 ?0542  ?NA 142  --  141 140 141 142  --   ?K 2.8*  --  3.0* 2.7* 3.7 6.2* 3.7  ?CL 100  --   --  99 104 114*  --   ?CO2 33*  --   --  28 28 22   --   ?GLUCOSE 115*  --   --  104* 97 79  --   ?BUN 5*  --   --  5* <5* <5*  --   ?CREATININE 0.74  --   --  0.66 0.64 0.50  --   ?CALCIUM 9.9  --   --  9.2 9.4 7.7*  --   ?MG  --  1.5*  --  1.8  --   --    --   ? ? ?Recent Labs  ?Lab 25-Sep-2021 ?1110  ?AST 21  ?ALT 7  ?ALKPHOS 72  ?BILITOT 0.6  ?PROT 6.4*  ?ALBUMIN 3.5  ? ? ?No results for input(s): LIPASE, AMYLASE in the last 168 hours. ?Recent Labs  ?Lab 03/

## 2021-09-22 NOTE — Plan of Care (Signed)
  Problem: Clinical Measurements: Goal: Ability to maintain clinical measurements within normal limits will improve Outcome: Progressing Goal: Will remain free from infection Outcome: Progressing Goal: Diagnostic test results will improve Outcome: Progressing Goal: Respiratory complications will improve Outcome: Progressing Goal: Cardiovascular complication will be avoided Outcome: Progressing   Problem: Elimination: Goal: Will not experience complications related to bowel motility Outcome: Progressing Goal: Will not experience complications related to urinary retention Outcome: Progressing   Problem: Pain Managment: Goal: General experience of comfort will improve Outcome: Progressing   Problem: Safety: Goal: Ability to remain free from injury will improve Outcome: Progressing   Problem: Skin Integrity: Goal: Risk for impaired skin integrity will decrease Outcome: Progressing   Problem: Education: Goal: Knowledge of General Education information will improve Description: Including pain rating scale, medication(s)/side effects and non-pharmacologic comfort measures Outcome: Not Progressing   Problem: Health Behavior/Discharge Planning: Goal: Ability to manage health-related needs will improve Outcome: Not Progressing   Problem: Activity: Goal: Risk for activity intolerance will decrease Outcome: Not Progressing   Problem: Nutrition: Goal: Adequate nutrition will be maintained Outcome: Not Progressing   Problem: Coping: Goal: Level of anxiety will decrease Outcome: Not Progressing   

## 2021-09-22 NOTE — Progress Notes (Signed)
PT Cancellation Note ? ?Patient Details ?Name: Alyssa Boyd ?MRN: [id number] ?DOB: 08-03-1940 ? ? ?Cancelled Treatment:    Reason Eval/Treat Not Completed: Other (comment) (per family pt to be transfers only on LLE since fx 3/9 with boot. Await spouse to bring boot for mobility) ? ? ?Jessic Standifer B Johnasia Liese ?09/22/2021, 11:52 AM ?Calani Gick P, PT ?Acute Rehabilitation Services ?Pager: [phone] ?Office: [phone] ? ?

## 2021-09-22 NOTE — Evaluation (Signed)
Clinical/Bedside Swallow Evaluation ?Patient Details  ?Name: Alyssa Boyd ?MRN: [id number] ?Date of Birth: [birth year] ? ?Today's Date: 09/22/2021 ?Time: SLP Start Time (ACUTE ONLY): 1310 SLP Stop Time (ACUTE ONLY): 1330 ?SLP Time Calculation (min) (ACUTE ONLY): 20 min ? ?Past Medical History:  ?Past Medical History:  ?Diagnosis Date  ? COPD (chronic obstructive pulmonary disease) (HCC)   ? Diabetes mellitus without complication (HCC)   ? Hyperlipidemia   ? Hypertension   ? Parkinson disease (HCC)   ? ?Past Surgical History: History reviewed. No pertinent surgical history. ?HPI:  ?Patient is a 81 y.o. female with PMH: Parkinson's disease, asthma, HTN, DM-2, OSA, RLS, GERD, recent left foot fracture who was admitted to hospital with AMS. Patient and spouse recently moved to [place] from [place] to be closer to grandchildren. Husband was giving her baclofen PRN rather than prescribed TID but facility ([facility]) was not aware of this and was still giving baclofen regularly. In ED patient was stable with bradycardia, mild HTN; CT head negative. CXR did not show any cariopulmonary abnormalities and both lungs were clear. NG tube was placed for patient to get her medications. On 3/16, patient much more alert, NG has been removed and SLP bedside/clinical swallow evaluation ordered.  ?  ?Assessment / Plan / Recommendation  ?Clinical Impression ? Patient presents with clinical s/s of dysphagia as per this bedside/clinical swallow evaluation. Per her husband, he cuts her food up into very small pieces as she is not able to effectively chew. Patient in bed with head leaned down and to right which patient and spouse report is her normal posture and that when she is eating/drinking, typically she would be in a transport chair. Patient assessed for toleration of PO's of puree solids and thin liquids. She initially had difficulty with sucking through straw but this improved over time. A couple of instances of delayed mild cought  respone observed after sips of thin liquids, occuring approximately 10% of the time. No significant difficulties observed with puree solids. After discussion with patient, spouse and daughter, SLP recommending Dys 2 solids (fine chop), thin liquids and this appears to be same as or similar to her diet previously. SLP will plan to follow patient briefly to ensure toleration of Dys 2 solids, thin liquids. ?SLP Visit Diagnosis: Dysphagia, unspecified (R13.10) ?   ?Aspiration Risk ? Mild aspiration risk  ?  ?Diet Recommendation Dysphagia 2 (Fine chop);Thin liquid  ? ?Liquid Administration via: Cup;Straw ?Medication Administration: Whole meds with liquid ?Supervision: Staff to assist with self feeding;Patient able to self feed;Full supervision/cueing for compensatory strategies ?Compensations: Slow rate;Small sips/bites ?Postural Changes: Seated upright at 90 degrees  ?  ?Other  Recommendations Oral Care Recommendations: Oral care BID;Staff/trained caregiver to provide oral care   ? ?Recommendations for follow up therapy are one component of a multi-disciplinary discharge planning process, led by the attending physician.  Recommendations may be updated based on patient status, additional functional criteria and insurance authorization. ? ?Follow up Recommendations Home health SLP  ? ? ?  ?Assistance Recommended at Discharge Frequent or constant Supervision/Assistance  ?Functional Status Assessment Patient has had a recent decline in their functional status and demonstrates the ability to make significant improvements in function in a reasonable and predictable amount of time.  ?Frequency and Duration min 1 x/week  ?1 week ?  ?   ? ?Prognosis Prognosis for Safe Diet Advancement: Fair  ? ?  ? ?Swallow Study   ?General Date of Onset: 09/20/21 ?HPI: Patient is a 81 y.o. female  with PMH: Parkinson's disease, asthma, HTN, DM-2, OSA, RLS, GERD, recent left foot fracture who was admitted to hospital with AMS. Patient and spouse  recently moved to [place] from [place] to be closer to grandchildren. Husband was giving her baclofen PRN rather than prescribed TID but facility ([facility]) was not aware of this and was still giving baclofen regularly. In ED patient was stable with bradycardia, mild HTN; CT head negative. CXR did not show any cariopulmonary abnormalities and both lungs were clear. NG tube was placed for patient to get her medications. On 3/16, patient much more alert, NG has been removed and SLP bedside/clinical swallow evaluation ordered. ?Type of Study: Bedside Swallow Evaluation ?Previous Swallow Assessment: none found ?Diet Prior to this Study: NPO ?Temperature Spikes Noted: No ?Respiratory Status: Room air ?History of Recent Intubation: No ?Behavior/Cognition: Alert;Cooperative;Pleasant mood ?Oral Cavity Assessment: Within Functional Limits ?Oral Care Completed by SLP: Recent completion by staff ?Oral Cavity - Dentition: Missing dentition ?Self-Feeding Abilities: Needs assist;Needs set up;Able to feed self ?Patient Positioning: Upright in bed ?Baseline Vocal Quality: Low vocal intensity ?Volitional Cough: Weak ?Volitional Swallow: Able to elicit  ?  ?Oral/Motor/Sensory Function Overall Oral Motor/Sensory Function: Mild impairment ?Facial ROM: Reduced right;Reduced left ?Facial Strength: Reduced right;Reduced left ?Lingual Symmetry: Within Functional Limits ?Lingual Strength: Reduced   ?Ice Chips     ?Thin Liquid Thin Liquid: Impaired ?Presentation: Straw ?Pharyngeal  Phase Impairments: Suspected delayed Swallow;Cough - Delayed ?Other Comments: 2 instances of brief cough response after straw sips of water  ?  ?Nectar Thick     ?Honey Thick     ?Puree Puree: Within functional limits ?Presentation: Spoon   ?Solid ? ? ?  Solid: Not tested  ? ?  ?Angela Nevin, MA, CCC-SLP ?Speech Therapy ? ? ? ? ?

## 2021-09-23 DIAGNOSIS — G9341 Metabolic encephalopathy: Secondary | ICD-10-CM | POA: Diagnosis not present

## 2021-09-23 LAB — GLUCOSE, CAPILLARY: Glucose-Capillary: 111 mg/dL — ABNORMAL HIGH (ref 70–99)

## 2021-09-23 LAB — METHYLMALONIC ACID, SERUM: Methylmalonic Acid, Quantitative: 84 nmol/L (ref 0–378)

## 2021-09-23 NOTE — Plan of Care (Signed)

## 2021-09-23 NOTE — Progress Notes (Signed)
Speech Language Pathology Treatment: Dysphagia  ?Patient Details ?Name: Alyssa Boyd ?MRN: [id number] ?DOB: 02-15-1941 ?Today's Date: 09/23/2021 ?Time: 7289-7915 ?SLP Time Calculation (min) (ACUTE ONLY): 10 min ? ?Assessment / Plan / Recommendation ?Clinical Impression ? Pt was seen with daughter present, and consumed small pieces of solids with sips of thin liquids. No overt s/s of aspiration were noted and with extra time, her swallow appeared grossly functional. Pt and daughter both believe that her Dys 2 diet is consistent with the textures she normally eats at home, and they don't have any concerns about her swallowing at this time. Will leave on current diet - SLP to sign off acutely. ?  ?HPI HPI: Patient is a 81 y.o. female with PMH: Parkinson's disease, asthma, HTN, DM-2, OSA, RLS, GERD, recent left foot fracture who was admitted to hospital with AMS. Patient and spouse recently moved to [place] from [place] to be closer to grandchildren. Husband was giving her baclofen PRN rather than prescribed TID but facility ([facility]) was not aware of this and was still giving baclofen regularly. In ED patient was stable with bradycardia, mild HTN; CT head negative. CXR did not show any cariopulmonary abnormalities and both lungs were clear. NG tube was placed for patient to get her medications. On 3/16, patient much more alert, NG has been removed and SLP bedside/clinical swallow evaluation ordered. ?  ?   ?SLP Plan ? All goals met ? ?  ?  ?Recommendations for follow up therapy are one component of a multi-disciplinary discharge planning process, led by the attending physician.  Recommendations may be updated based on patient status, additional functional criteria and insurance authorization. ?  ? ?Recommendations  ?Diet recommendations: Dysphagia 2 (fine chop);Thin liquid ?Liquids provided via: Cup;Straw ?Medication Administration: Whole meds with puree ?Supervision: Staff to assist with self feeding ?Compensations:  Slow rate;Small sips/bites ?Postural Changes and/or Swallow Maneuvers: Seated upright 90 degrees  ?   ?    ?   ? ? ? ? Oral Care Recommendations: Oral care BID;Staff/trained caregiver to provide oral care ?Follow Up Recommendations: No SLP follow up ?Assistance recommended at discharge: Frequent or constant Supervision/Assistance ?SLP Visit Diagnosis: Dysphagia, unspecified (R13.10) ?Plan: All goals met ? ? ? ? ?  ?  ? ? ?Osie Bond., M.A. CCC-SLP ?Acute Rehabilitation Services ?Pager [phone] ?Office [phone] ? ? ?09/23/2021, 9:57 AM ?

## 2021-09-23 NOTE — Progress Notes (Signed)
Went over discharged paperwork with daughter. Removed PIV's and telemetry. Patient stable for discharge. All belongings returned.  ?

## 2021-09-23 NOTE — Discharge Summary (Signed)
Physician Discharge Summary  ?Alyssa Boyd GQQ:[id number] DOB: [birth year] DOA: 09/20/2021 ? ?PCP: Joycelyn Man, NP ? ?Admit date: 09/20/2021 ?Discharge date: 09/23/2021 ?Recommendations for Outpatient Follow-up:  ?Follow up with PCP in 1 weeks-call for appointment ?Please obtain BMP/CBC in one week ? ?Discharge Dispo: [facility]. ?Discharge Condition: Stable ?Code Status:   Code Status: Full Code ?Diet recommendation:  ?Diet Order   ? ?       ?  DIET DYS 2 Room service appropriate? Yes; Fluid consistency: Thin  Diet effective now       ?  ? ?  ?  ? ?  ?  ? ?Brief/Interim Summary: ?81 y.o. female with medical history significant of Parkinson's disease, asthma, hypertension, type 2 diabetes, OSA, restless leg syndrome, GERD, recent left foot fracture admitted with altered mental status.  ?Per report she moved here last week from [place] to be closer to her grandchildren.  She initially lived with her daughter and then moved into [facility] 4 days PTA with her husband. Husband was giving her baclofen PRN rather than the prescribed TID,however facility was not aware of this and started giving it to her regularly 2 days PTA and she became more fatigued and eventually was completely lethargic/completely bedbound.At baseline, patient can walk and feed herself but needs help with her other ADLs.   ?In the ED, vitals stable with bradycardia mild hypertension, labs showed hypokalemia hypomagnesemia   ?CT of the head was negative.  MRI brain was negative.  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  CTA neck showed fusiform dilatation of the right V2 measuring up to 5 mm extending for approximately 5 mm at the level of C4.  Right vertebral artery is otherwise unremarkable.  No significant stenosis in the neck. She was also noted to have some slight twitching of her tongue concerning for seizure activity.  Neurology was consulted and admitted. ?Potassium was replaced, EEG was ordered  folate.  B12 390, TSH 3, UA WBC 6-10 negative for nitrite and leukocytes ?  ? ?Discharge Diagnoses:  ?Principal Problem: ?  Acute metabolic encephalopathy ?Active Problems: ?  Parkinson disease (HCC) ?  HTN (hypertension) ?  Type 2 diabetes mellitus (HCC) ?  Aneurysm of vertebral artery (HCC) ?  Foot fracture, left, sequela ?  Hypokalemia ?  Hypomagnesemia ?  COPD (chronic obstructive pulmonary disease) (HCC) ?  Hyperlipidemia ?  Acute encephalopathy ? ?Assessment and Plan: ? ?Acute toxic metabolic encephalopathy: Likely from baclofen use.So far unremarkable CTA neck CT head MRI brain MRA to explain.  EEG shows moderate to severe encephalopathy, no definite seizure activity.  Urine cultures no growth.patient previously on baclofen PRN but moved to a new facility and was given scheduled Baclofen TID and symptoms correlates with the timeline.  Appreciate neurology input.  Zyprexa has been held-reassessed by PCP if she needs IT. Mental status improved and at baseline.  Please do not resume baclofen in future ?  ?Parkinson disease: Continue p.o. Sinemet  ?  ?Hypomagnesemia: Resolved ?Hypokalemia: Resolved.  Falsely reviewed potassium normal on recheck  ?  ?Foot fracture, left, sequela: has inversion of her left foot at baseline due to Parkinson's and had a non-traumatic fracture this week. She is currently following [facility] outpatient to decide on definite treatment. Daughter will bring back her boot for immobilization while inpatient.  ?  ?Aneurysm of vertebral artery: ?Noted on  CTA neck In ED-  measuring up to 5 mm extending for approximately 5 mm at the level  of C4.follow up with neurosurgery outpatient for monitoring  ?  ?Type 2 diabetes mellitus: Blood sugar stable.  Continue home meds ?HTN: Patient resume home MEDS ?Pyuria mild in urine, urine culture no growth.  Asymptomatic.   ?Hyperlipidemia resume statin  ?COPD cont her inhaler. ?  ?Deconditioning/debility: We will mobilize as able, PT OT consulted-at  this time daughter and patient wanted to go home today and requesting that they will have PT assess at the facility  ? ?Consults: ?Neurology ?Subjective: ?Alert awake oriented at baseline, daughter at the bedside.  Requesting to be discharged this morning she is completely back to normal ? ?Discharge Exam: ?Vitals:  ? 09/23/21 0342 09/23/21 0745  ?BP: (!) 135/52 140/61  ?Pulse: 67   ?Resp: 17   ?Temp: 97.6 ?F (36.4 ?C)   ?SpO2: 96%   ? ?General: Pt is alert, awake, not in acute distress ?Cardiovascular: RRR, S1/S2 +, no rubs, no gallops ?Respiratory: CTA bilaterally, no wheezing, no rhonchi ?Abdominal: Soft, NT, ND, bowel sounds + ?Extremities: no edema, no cyanosis ? ?Discharge Instructions ? ?Discharge Instructions   ? ? Discharge instructions   Complete by: As directed ?  ? Please AVOID baclofen at the facility ? ?Noted on  CTA neck In Ed WITH 5 mm aneurysm extending for approximately 5 mm at the level of C4.follow up with neurosurgery outpatient for monitoring ? ? ?Please call call MD or return to ER for similar or worsening recurring problem that brought you to hospital or if any fever,nausea/vomiting,abdominal pain, uncontrolled pain, chest pain,  shortness of breath or any other alarming symptoms. ? ?Please follow-up your doctor as instructed in a week time and call the office for appointment. ? ?Please avoid alcohol, smoking, or any other illicit substance and maintain healthy habits including taking your regular medications as prescribed. ? ?You were cared for by a hospitalist during your hospital stay. If you have any questions about your discharge medications or the care you received while you were in the hospital after you are discharged, you can call the unit and ask to speak with the hospitalist on call if the hospitalist that took care of you is not available. ? ?Once you are discharged, your primary care physician will handle any further medical issues. Please note that NO REFILLS for any discharge  medications will be authorized once you are discharged, as it is imperative that you return to your primary care physician (or establish a relationship with a primary care physician if you do not have one) for your aftercare needs so that they can reassess your need for medications and monitor your lab values  ? Increase activity slowly   Complete by: As directed ?  ? ?  ? ?Allergies as of 09/23/2021   ? ?   Reactions  ? Shellfish Allergy   ? ?  ? ?  ?Medication List  ?  ? ?STOP taking these medications   ? ?baclofen 10 MG tablet ?Commonly known as: LIORESAL ?  ?OLANZapine 7.5 MG tablet ?Commonly known as: ZYPREXA ?  ? ?  ? ?TAKE these medications   ? ?albuterol 108 (90 Base) MCG/ACT inhaler ?Commonly known as: VENTOLIN HFA ?Inhale 2 puffs into the lungs every 6 (six) hours as needed for wheezing or shortness of breath. ?  ?aspirin EC 81 MG tablet ?Take 81 mg by mouth daily. Swallow whole. ?  ?atorvastatin 20 MG tablet ?Commonly known as: LIPITOR ?Take 20 mg by mouth daily. ?  ?CALCIUM-VITAMIN D3 PO ?Take 5 mg  by mouth daily. ?  ?carbidopa-levodopa 25-100 MG tablet ?Commonly known as: SINEMET IR ?Take 1 tablet by mouth 4 (four) times daily. ?  ?donepezil 5 MG tablet ?Commonly known as: ARICEPT ?Take 5 mg by mouth at bedtime. ?  ?fluticasone 50 MCG/ACT nasal spray ?Commonly known as: FLONASE ?Place 2 sprays into both nostrils at bedtime. ?  ?fluticasone-salmeterol 250-50 MCG/ACT Aepb ?Commonly known as: ADVAIR ?Inhale 1 puff into the lungs in the morning and at bedtime. ?  ?furosemide 20 MG tablet ?Commonly known as: LASIX ?Take 20 mg by mouth 2 (two) times daily. ?  ?levalbuterol 1.25 MG/3ML nebulizer solution ?Commonly known as: XOPENEX ?Take 1.25 mg by nebulization every 6 (six) hours as needed for wheezing or shortness of breath. ?  ?METOPROLOL TARTRATE PO ?Take 1 tablet by mouth 2 (two) times daily. No mg listed on mar ?  ?montelukast 10 MG tablet ?Commonly known as: SINGULAIR ?Take 10 mg by mouth at bedtime. ?   ?MULTI COMPLETE PO ?Take 1 tablet by mouth daily. ?  ?rOPINIRole 4 MG tablet ?Commonly known as: REQUIP ?Take 8 mg by mouth at bedtime. ?  ?Spiriva Respimat 2.5 MCG/ACT Aers ?Generic drug: Tiotropium Bro

## 2021-09-23 NOTE — Progress Notes (Signed)
PT Cancellation Note ? ?Patient Details ?Name: Alyssa Boyd ?MRN: [id number] ?DOB: Jun 07, 1941 ? ? ?Cancelled Treatment:    Reason Eval/Treat Not Completed: Other (comment) (family declined acute P.T. eval and plan to D/C to SNF and have eval with HHPT) ? ? ?Alyssa Boyd ?09/23/2021, 10:44 AM ?Rebecca Cairns P, PT ?Acute Rehabilitation Services ?Pager: [phone] ?Office: [phone] ? ?

## 2021-09-25 LAB — CULTURE, BLOOD (ROUTINE X 2)
Culture: NO GROWTH
Culture: NO GROWTH
Special Requests: ADEQUATE

## 2021-12-14 ENCOUNTER — Emergency Department (HOSPITAL_COMMUNITY)
Admission: EM | Admit: 2021-12-14 | Discharge: 2021-12-14 | Disposition: A | Discharge status: Home / Self Care | Payer: Medicare Other | Attending: Student | Admitting: Student

## 2021-12-14 ENCOUNTER — Encounter (HOSPITAL_COMMUNITY): Payer: Self-pay | Admitting: Emergency Medicine

## 2021-12-14 ENCOUNTER — Emergency Department (HOSPITAL_COMMUNITY): Payer: Medicare Other

## 2021-12-14 ENCOUNTER — Other Ambulatory Visit: Payer: Self-pay

## 2021-12-14 DIAGNOSIS — I10 Essential (primary) hypertension: Secondary | ICD-10-CM | POA: Insufficient documentation

## 2021-12-14 DIAGNOSIS — E119 Type 2 diabetes mellitus without complications: Secondary | ICD-10-CM | POA: Insufficient documentation

## 2021-12-14 DIAGNOSIS — Z7982 Long term (current) use of aspirin: Secondary | ICD-10-CM | POA: Insufficient documentation

## 2021-12-14 DIAGNOSIS — G20 Parkinson's disease: Secondary | ICD-10-CM | POA: Diagnosis not present

## 2021-12-14 DIAGNOSIS — Z79899 Other long term (current) drug therapy: Secondary | ICD-10-CM | POA: Insufficient documentation

## 2021-12-14 DIAGNOSIS — Z7951 Long term (current) use of inhaled steroids: Secondary | ICD-10-CM | POA: Insufficient documentation

## 2021-12-14 DIAGNOSIS — K5900 Constipation, unspecified: Secondary | ICD-10-CM | POA: Diagnosis not present

## 2021-12-14 DIAGNOSIS — J449 Chronic obstructive pulmonary disease, unspecified: Secondary | ICD-10-CM | POA: Diagnosis not present

## 2021-12-14 DIAGNOSIS — R109 Unspecified abdominal pain: Secondary | ICD-10-CM | POA: Diagnosis present

## 2021-12-14 LAB — CBC WITH DIFFERENTIAL/PLATELET
Abs Immature Granulocytes: 0.03 10*3/uL (ref 0.00–0.07)
Basophils Absolute: 0 10*3/uL (ref 0.0–0.1)
Basophils Relative: 1 %
Eosinophils Absolute: 0.2 10*3/uL (ref 0.0–0.5)
Eosinophils Relative: 2 %
HCT: 36.3 % (ref 36.0–46.0)
Hemoglobin: 12.1 g/dL (ref 12.0–15.0)
Immature Granulocytes: 0 %
Lymphocytes Relative: 32 %
Lymphs Abs: 2.4 10*3/uL (ref 0.7–4.0)
MCH: 31.3 pg (ref 26.0–34.0)
MCHC: 33.3 g/dL (ref 30.0–36.0)
MCV: 94 fL (ref 80.0–100.0)
Monocytes Absolute: 0.7 10*3/uL (ref 0.1–1.0)
Monocytes Relative: 9 %
Neutro Abs: 4.1 10*3/uL (ref 1.7–7.7)
Neutrophils Relative %: 56 %
Platelets: 274 10*3/uL (ref 150–400)
RBC: 3.86 MIL/uL — ABNORMAL LOW (ref 3.87–5.11)
RDW: 13.1 % (ref 11.5–15.5)
WBC: 7.4 10*3/uL (ref 4.0–10.5)
nRBC: 0 % (ref 0.0–0.2)

## 2021-12-14 LAB — COMPREHENSIVE METABOLIC PANEL
ALT: <5 U/L (ref 0–44)
AST: 15 U/L (ref 15–41)
Albumin: 3.4 g/dL — ABNORMAL LOW (ref 3.5–5.0)
Alkaline Phosphatase: 87 U/L (ref 38–126)
Anion gap: 10 (ref 5–15)
BUN: 11 mg/dL (ref 8–23)
CO2: 30 mmol/L (ref 22–32)
Calcium: 9.7 mg/dL (ref 8.9–10.3)
Chloride: 96 mmol/L — ABNORMAL LOW (ref 98–111)
Creatinine, Ser: 0.69 mg/dL (ref 0.44–1.00)
GFR, Estimated: >60 mL/min (ref >60)
Glucose, Bld: 132 mg/dL — ABNORMAL HIGH (ref 70–99)
Potassium: 3.2 mmol/L — ABNORMAL LOW (ref 3.5–5.1)
Sodium: 136 mmol/L (ref 135–145)
Total Bilirubin: 0.3 mg/dL (ref 0.3–1.2)
Total Protein: 6.3 g/dL — ABNORMAL LOW (ref 6.5–8.1)

## 2021-12-14 LAB — LIPASE, BLOOD: Lipase: 27 U/L (ref 11–51)

## 2021-12-14 IMAGING — DX DG ABDOMEN 1V
1 series · 2 of 2 positions shown · non-contrast
Comparison: [DATE]

CLINICAL DATA: Abdominal pain

EXAM:
ABDOMEN - 1 VIEW

[Series 1: abdomen · 0.14mm/px · 2 of 2 slices shown]
[im 1/2]
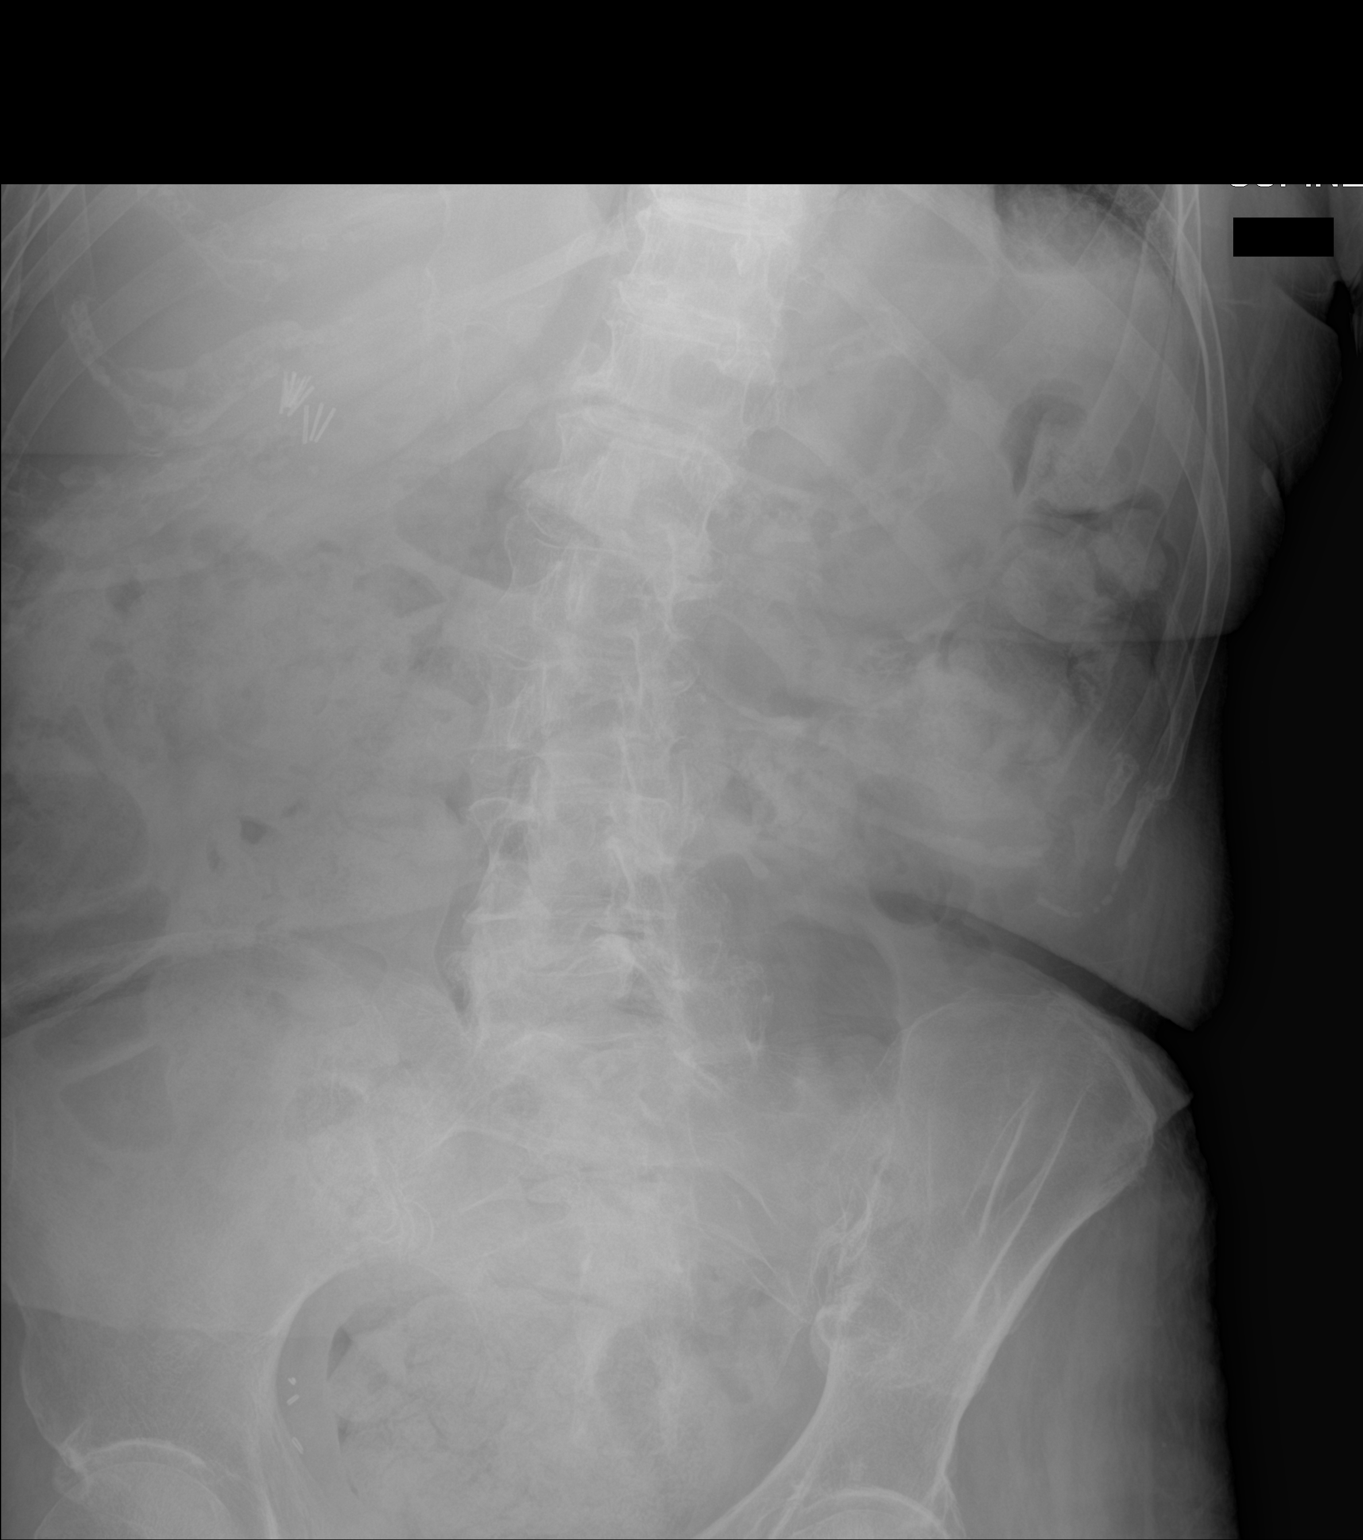
[im 2/2]
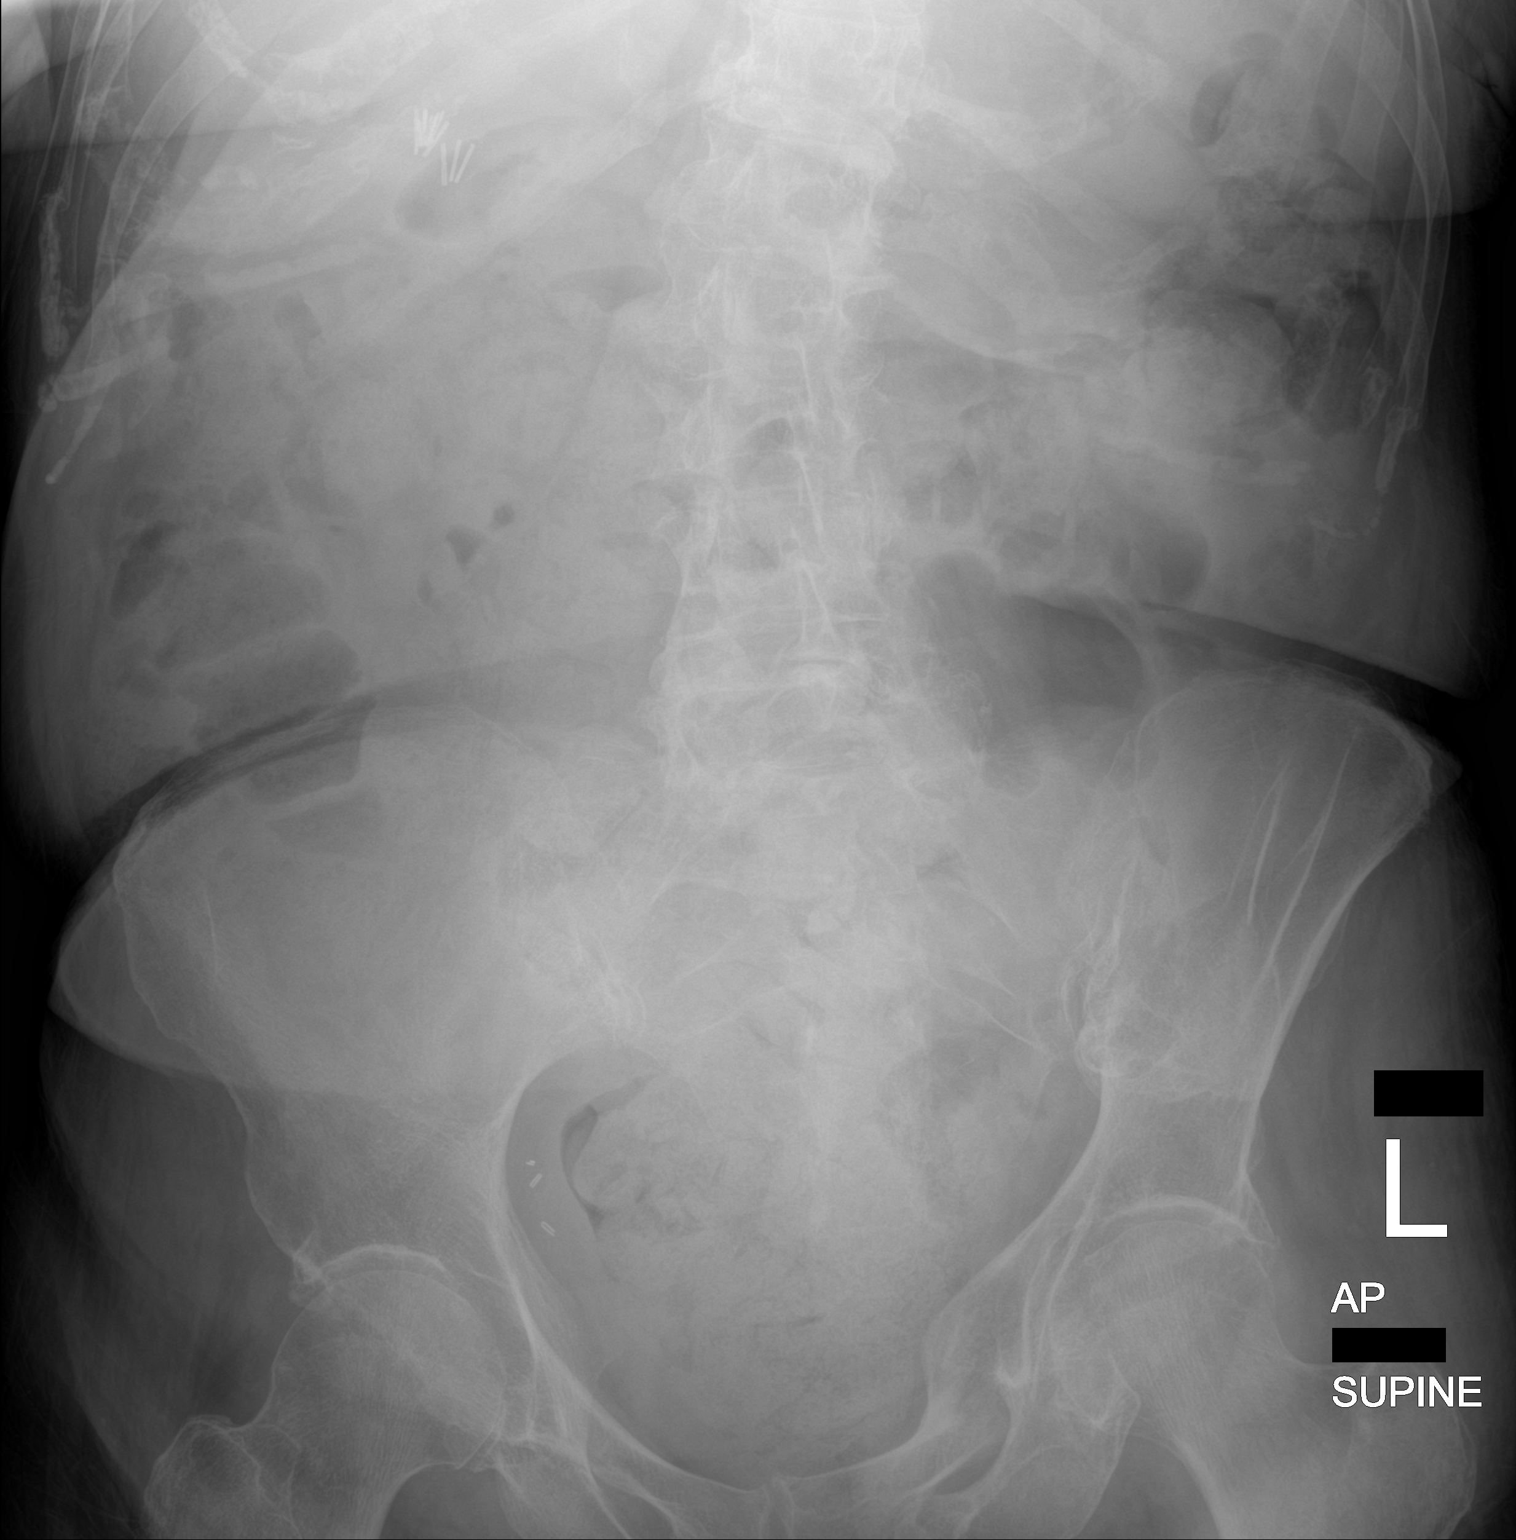

[2 of 2 positions shown; findings below may reference images not displayed]

FINDINGS: Scattered large and small bowel gas is noted. Fecal material is
noted throughout the colon consistent with a mild degree of
constipation. Prominent fecal material in the rectum is seen.
Changes of prior cholecystectomy are noted. Degenerative lumbar
spine changes are seen and stable.
IMPRESSION: Mild constipation.

## 2021-12-14 MED ORDER — GLYCERIN (LAXATIVE) 2 G RE SUPP
1.0000 | Freq: Once | RECTAL | Status: AC
  Administered 2021-12-14: 1 via RECTAL
  Filled 2021-12-14: qty 1

## 2021-12-14 MED ORDER — GLYCERIN (ADULT) 2 G RE SUPP: 1.0000 | RECTAL | 0 refills | Status: DC | PRN

## 2021-12-14 MED ORDER — LACTULOSE 10 GM/15ML PO SOLN
30.0000 g | Freq: Once | ORAL | Status: AC
  Administered 2021-12-14: 30 g via ORAL
  Filled 2021-12-14: qty 60

## 2021-12-14 NOTE — ED Notes (Signed)
PT had a large BM that was soft.

## 2021-12-14 NOTE — ED Triage Notes (Signed)
Pt BIB [facility] from [facility] with reports of constipation x 1 week.

## 2021-12-14 NOTE — ED Provider Notes (Signed)
[facility] Note  CSN: [id number] Arrival date & time: 12/14/21 1818  Chief Complaint(s) Constipation  HPI Alyssa Boyd is a 81 y.o. female with PMH COPD, T2DM, HTN, HLD, Parkinson's disease who presents emergency department for evaluation of constipation.  Patient states that she has not had a bowel movement in 1 week and abdominal distention and pain.  She states that she previously was on MiraLAX but then had diarrhea they have since not restarted this medication.  She denies any opiate use.  Denies nausea, vomiting, headache, fever or other systemic symptoms.   Past Medical History Past Medical History:  Diagnosis Date   COPD (chronic obstructive pulmonary disease) (HCC)    Diabetes mellitus without complication (HCC)    Hyperlipidemia    Hypertension    Parkinson disease (HCC)    Patient Active Problem List   Diagnosis Date Noted   Acute encephalopathy 09/21/2021   COPD (chronic obstructive pulmonary disease) (HCC)    Hyperlipidemia    AMS (altered mental status) 09/20/2021   Acute metabolic encephalopathy 09/20/2021   Parkinson disease (HCC) 09/20/2021   HTN (hypertension) 09/20/2021   Type 2 diabetes mellitus (HCC) 09/20/2021   Aneurysm of vertebral artery (HCC) 09/20/2021   Foot fracture, left, sequela 09/20/2021   Hypokalemia 09/20/2021   Hypomagnesemia 09/20/2021   Home Medication(s) Prior to Admission medications   Medication Sig Start Date End Date Taking? Authorizing Provider  glycerin adult 2 g suppository Place 1 suppository rectally as needed for constipation. 12/14/21  Yes Athea Haley, MD  albuterol (VENTOLIN HFA) 108 (90 Base) MCG/ACT inhaler Inhale 2 puffs into the lungs every 6 (six) hours as needed for wheezing or shortness of breath.    [provider]  aspirin EC 81 MG tablet Take 81 mg by mouth daily. Swallow whole.    [provider]  atorvastatin (LIPITOR) 20 MG tablet Take 20  mg by mouth daily.    [provider]  Calcium Carbonate-Vitamin D (CALCIUM-VITAMIN D3 PO) Take 5 mg by mouth daily.    [provider]  carbidopa-levodopa (SINEMET IR) 25-100 MG tablet Take 1 tablet by mouth 4 (four) times daily.    [provider]  Cholecalciferol (VITAMIN D3) 50 MCG (2000 UT) CHEW Chew 50 mcg by mouth daily.    [provider]  donepezil (ARICEPT) 5 MG tablet Take 5 mg by mouth at bedtime.    [provider]  fluticasone (FLONASE) 50 MCG/ACT nasal spray Place 2 sprays into both nostrils at bedtime.    [provider]  fluticasone-salmeterol (ADVAIR) 250-50 MCG/ACT AEPB Inhale 1 puff into the lungs in the morning and at bedtime.    [provider]  furosemide (LASIX) 20 MG tablet Take 20 mg by mouth 2 (two) times daily.    [provider]  levalbuterol Pauline Aus(XOPENEX) 1.25 MG/3ML nebulizer solution Take 1.25 mg by nebulization every 6 (six) hours as needed for wheezing or shortness of breath.    [provider]  METOPROLOL TARTRATE PO Take 1 tablet by mouth 2 (two) times daily. No mg listed on mar    [provider]  montelukast (SINGULAIR) 10 MG tablet Take 10 mg by mouth at bedtime.    [provider]  Multiple Vitamins-Minerals (MULTI COMPLETE PO) Take 1 tablet by mouth daily.    [provider]  rOPINIRole (REQUIP) 4 MG tablet Take 8 mg by mouth at bedtime.    [provider]  telmisartan (MICARDIS)  40 MG tablet Take 40 mg by mouth daily.    [provider]  Tiotropium Bromide Monohydrate (SPIRIVA RESPIMAT) 2.5 MCG/ACT AERS Inhale 2 puffs into the lungs daily.    [provider]  venlafaxine (EFFEXOR) 75 MG tablet Take 225 mg by mouth daily.    [provider]                                                                                                                                    Past Surgical History History reviewed. No pertinent  surgical history. Family History No family history on file.  Social History   Allergies Shellfish allergy  Review of Systems Review of Systems  Gastrointestinal:  Positive for abdominal distention, abdominal pain and constipation.   Physical Exam Vital Signs  I have reviewed the triage vital signs BP (!) 146/69   Pulse 77   Temp 98.5 F (36.9 C) (Oral)   Resp 17   SpO2 99%   Physical Exam Vitals and nursing note reviewed.  Constitutional:      General: She is not in acute distress.    Appearance: She is well-developed.  HENT:     Head: Normocephalic and atraumatic.  Eyes:     Conjunctiva/sclera: Conjunctivae normal.  Cardiovascular:     Rate and Rhythm: Normal rate and regular rhythm.     Heart sounds: No murmur heard. Pulmonary:     Effort: Pulmonary effort is normal. No respiratory distress.     Breath sounds: Normal breath sounds.  Abdominal:     General: There is distension.     Palpations: Abdomen is soft.     Tenderness: There is abdominal tenderness.  Musculoskeletal:        General: No swelling.     Cervical back: Neck supple.  Skin:    General: Skin is warm and dry.     Capillary Refill: Capillary refill takes less than 2 seconds.  Neurological:     Mental Status: She is alert.  Psychiatric:        Mood and Affect: Mood normal.    ED Results and Treatments Labs (all labs ordered are listed, but only abnormal results are displayed) Labs Reviewed  CBC WITH DIFFERENTIAL/PLATELET - Abnormal; Notable for the following components:      Result Value   RBC 3.86 (*)    All other components within normal limits  COMPREHENSIVE METABOLIC PANEL - Abnormal; Notable for the following components:   Potassium 3.2 (*)    Chloride 96 (*)    Glucose, Bld 132 (*)    Total Protein 6.3 (*)    Albumin 3.4 (*)    All other components within normal limits  LIPASE, BLOOD  URINALYSIS, ROUTINE W REFLEX MICROSCOPIC  Radiology DG Abdomen 1 View  Result Date: 12/14/2021 CLINICAL DATA:  Abdominal pain EXAM: ABDOMEN - 1 VIEW COMPARISON:  09/21/2021 FINDINGS: Scattered large and small bowel gas is noted. Fecal material is noted throughout the colon consistent with a mild degree of constipation. Prominent fecal material in the rectum is seen. Changes of prior cholecystectomy are noted. Degenerative lumbar spine changes are seen and stable. IMPRESSION: Mild constipation. Electronically Signed   By: Alcide Clever M.D.   On: 12/14/2021 20:52    Pertinent labs & imaging results that were available during my care of the patient were reviewed by me and considered in my medical decision making (see MDM for details).  Medications Ordered in ED Medications  Glycerin (Adult) 2 g suppository 1 suppository (1 suppository Rectal Given 12/14/21 2032)  lactulose (CHRONULAC) 10 GM/15ML solution 30 g (30 g Oral Given 12/14/21 2111)                                                                                                                                     Procedures Procedures  (including critical care time)  Medical Decision Making / ED Course   This patient presents to the ED for concern of abdominal pain, constipation, this involves an extensive number of treatment options, and is a complaint that carries with it a high risk of complications and morbidity.  The differential diagnosis includes constipation, SBO, medication side effect, failure of outpatient bowel regimen  MDM: Patient seen emergency room for evaluation of constipation.  Physical exam with mild abdominal distention and generalized mild abdominal tenderness to palpation but is otherwise unremarkable.  Laboratory evaluation with mild hypokalemia to 3.2, hypochloremia to 96 but is otherwise unremarkable.  KUB with mild stool burden.  No evidence of obstruction.  Patient given lactulose and  glycerin suppository and was able to have a successful bowel movement here in the emergency department.  Patient then discharged with glycerin suppositories and instructions to use MiraLAX as needed.   Additional history obtained: -Additional history obtained from multiple family members -External records from outside source obtained and reviewed including: Chart review including previous notes, labs, imaging, consultation notes   Lab Tests: -I ordered, reviewed, and interpreted labs.   The pertinent results include:   Labs Reviewed  CBC WITH DIFFERENTIAL/PLATELET - Abnormal; Notable for the following components:      Result Value   RBC 3.86 (*)    All other components within normal limits  COMPREHENSIVE METABOLIC PANEL - Abnormal; Notable for the following components:   Potassium 3.2 (*)    Chloride 96 (*)    Glucose, Bld 132 (*)    Total Protein 6.3 (*)    Albumin 3.4 (*)    All other components within normal limits  LIPASE, BLOOD  URINALYSIS, ROUTINE W REFLEX MICROSCOPIC       Imaging Studies ordered: I ordered imaging studies including KUB I independently visualized and interpreted  imaging. I agree with the radiologist interpretation   Medicines ordered and prescription drug management: Meds ordered this encounter  Medications   Glycerin (Adult) 2 g suppository 1 suppository   lactulose (CHRONULAC) 10 GM/15ML solution 30 g   glycerin adult 2 g suppository    Sig: Place 1 suppository rectally as needed for constipation.    Dispense:  12 suppository    Refill:  0    -I have reviewed the patients home medicines and have made adjustments as needed  Critical interventions none     Cardiac Monitoring: The patient was maintained on a cardiac monitor.  I personally viewed and interpreted the cardiac monitored which showed an underlying rhythm of: NSR  Social Determinants of Health:  Factors impacting patients care include: none   Reevaluation: After the  interventions noted above, I reevaluated the patient and found that they have :improved  Co morbidities that complicate the patient evaluation  Past Medical History:  Diagnosis Date   COPD (chronic obstructive pulmonary disease) (HCC)    Diabetes mellitus without complication (HCC)    Hyperlipidemia    Hypertension    Parkinson disease (HCC)       Dispostion: I considered admission for this patient, but given that constipation has resolved here in the emergency department she is safe for discharge with outpatient follow-up     Final Clinical Impression(s) / ED Diagnoses Final diagnoses:  Constipation, unspecified constipation type     @    Glendora Score, MD 12/14/21 2312

## 2021-12-14 NOTE — ED Provider Triage Note (Signed)
Emergency Medicine Provider Triage Evaluation Note  Alyssa Boyd , a 81 y.o. female  was evaluated in triage.  Pt complains of constipation.  Has not had a bowel movement in 1 week, taking MiraLAX and drinking.  Juice with no relief.  Pain in her bottom, also having the cramping abdominal pain when she cannot pass a bowel movement.  Denies any vomiting, denies previous abdominal surgeries..  Review of Systems  Per HPI  Physical Exam  There were no vitals taken for this visit. Gen:   Awake, no distress   Resp:  Normal effort  MSK:   Moves extremities without difficulty  Other:  Abdomen is soft and not specifically tender  Medical Decision Making  Medically screening exam initiated at 6:30 PM.  Appropriate orders placed.  Alyssa Boyd was informed that the remainder of the evaluation will be completed by another provider, this initial triage assessment does not replace that evaluation, and the importance of remaining in the ED until their evaluation is complete.     Theron Arista, PA-C 12/14/21 1831

## 2021-12-14 NOTE — ED Notes (Signed)
PT placed on bed-side commode

## 2022-03-16 ENCOUNTER — Other Ambulatory Visit: Payer: Self-pay | Admitting: Neurosurgery

## 2022-03-16 DIAGNOSIS — I726 Aneurysm of vertebral artery: Secondary | ICD-10-CM

## 2022-06-22 ENCOUNTER — Emergency Department (HOSPITAL_COMMUNITY): Payer: Medicare Other

## 2022-06-22 ENCOUNTER — Emergency Department (HOSPITAL_COMMUNITY)
Admission: EM | Admit: 2022-06-22 | Discharge: 2022-06-22 | Disposition: A | Discharge status: Home / Self Care | Payer: Medicare Other | Attending: Emergency Medicine | Admitting: Emergency Medicine

## 2022-06-22 DIAGNOSIS — Z7951 Long term (current) use of inhaled steroids: Secondary | ICD-10-CM | POA: Diagnosis not present

## 2022-06-22 DIAGNOSIS — R109 Unspecified abdominal pain: Secondary | ICD-10-CM | POA: Insufficient documentation

## 2022-06-22 DIAGNOSIS — G20A1 Parkinson's disease without dyskinesia, without mention of fluctuations: Secondary | ICD-10-CM | POA: Insufficient documentation

## 2022-06-22 DIAGNOSIS — J449 Chronic obstructive pulmonary disease, unspecified: Secondary | ICD-10-CM | POA: Insufficient documentation

## 2022-06-22 DIAGNOSIS — K5900 Constipation, unspecified: Secondary | ICD-10-CM | POA: Diagnosis present

## 2022-06-22 DIAGNOSIS — I10 Essential (primary) hypertension: Secondary | ICD-10-CM | POA: Diagnosis not present

## 2022-06-22 DIAGNOSIS — Z79899 Other long term (current) drug therapy: Secondary | ICD-10-CM | POA: Diagnosis not present

## 2022-06-22 DIAGNOSIS — Z7982 Long term (current) use of aspirin: Secondary | ICD-10-CM | POA: Insufficient documentation

## 2022-06-22 LAB — COMPREHENSIVE METABOLIC PANEL
ALT: <5 U/L (ref 0–44)
AST: 19 U/L (ref 15–41)
Albumin: 3.4 g/dL — ABNORMAL LOW (ref 3.5–5.0)
Alkaline Phosphatase: 91 U/L (ref 38–126)
Anion gap: 10 (ref 5–15)
BUN: 6 mg/dL — ABNORMAL LOW (ref 8–23)
CO2: 27 mmol/L (ref 22–32)
Calcium: 9.5 mg/dL (ref 8.9–10.3)
Chloride: 100 mmol/L (ref 98–111)
Creatinine, Ser: 0.78 mg/dL (ref 0.44–1.00)
GFR, Estimated: >60 mL/min (ref >60)
Glucose, Bld: 106 mg/dL — ABNORMAL HIGH (ref 70–99)
Potassium: 3.5 mmol/L (ref 3.5–5.1)
Sodium: 137 mmol/L (ref 135–145)
Total Bilirubin: 0.7 mg/dL (ref 0.3–1.2)
Total Protein: 6.6 g/dL (ref 6.5–8.1)

## 2022-06-22 LAB — CBC WITH DIFFERENTIAL/PLATELET
Abs Immature Granulocytes: 0.03 10*3/uL (ref 0.00–0.07)
Basophils Absolute: 0 10*3/uL (ref 0.0–0.1)
Basophils Relative: 1 %
Eosinophils Absolute: 0.1 10*3/uL (ref 0.0–0.5)
Eosinophils Relative: 1 %
HCT: 31.9 % — ABNORMAL LOW (ref 36.0–46.0)
Hemoglobin: 11.8 g/dL — ABNORMAL LOW (ref 12.0–15.0)
Immature Granulocytes: 0 %
Lymphocytes Relative: 25 %
Lymphs Abs: 1.9 10*3/uL (ref 0.7–4.0)
MCH: 36.3 pg — ABNORMAL HIGH (ref 26.0–34.0)
MCHC: 37 g/dL — ABNORMAL HIGH (ref 30.0–36.0)
MCV: 98.2 fL (ref 80.0–100.0)
Monocytes Absolute: 0.8 10*3/uL (ref 0.1–1.0)
Monocytes Relative: 11 %
Neutro Abs: 4.8 10*3/uL (ref 1.7–7.7)
Neutrophils Relative %: 62 %
Platelets: 283 10*3/uL (ref 150–400)
RBC: 3.25 MIL/uL — ABNORMAL LOW (ref 3.87–5.11)
RDW: 13.2 % (ref 11.5–15.5)
WBC: 7.7 10*3/uL (ref 4.0–10.5)
nRBC: 0 % (ref 0.0–0.2)

## 2022-06-22 LAB — LIPASE, BLOOD: Lipase: 28 U/L (ref 11–51)

## 2022-06-22 MED ORDER — GLYCERIN (LAXATIVE) 2 G RE SUPP
1.0000 | Freq: Once | RECTAL | Status: AC
  Administered 2022-06-22: 1 via RECTAL
  Filled 2022-06-22: qty 1

## 2022-06-22 MED ORDER — IOHEXOL 350 MG/ML SOLN
75.0000 mL | Freq: Once | INTRAVENOUS | Status: AC | PRN
  Administered 2022-06-22: 75 mL via INTRAVENOUS

## 2022-06-22 MED ORDER — GLYCERIN (ADULT) 2 G RE SUPP: 1.0000 | RECTAL | 0 refills | Status: DC | PRN

## 2022-06-22 MED ORDER — LACTULOSE 10 GM/15ML PO SOLN
30.0000 g | Freq: Once | ORAL | Status: AC
  Administered 2022-06-22: 30 g via ORAL
  Filled 2022-06-22: qty 60

## 2022-06-22 MED ORDER — LACTULOSE 20 G PO PACK: 20.0000 g | PACK | Freq: Three times a day (TID) | ORAL | 0 refills | Status: DC

## 2022-06-22 NOTE — Discharge Instructions (Addendum)
Please take lactulose and use glycerin suppository for constipation. I recommend close follow-up with PCP for reevaluation.  Please do not hesitate to return to emergency department if worrisome signs symptoms we discussed become apparent.

## 2022-06-22 NOTE — ED Provider Notes (Signed)
[facility] EMERGENCY DEPARTMENT Provider Note   CSN: [id number] Arrival date & time: 06/22/22  1335     History  Chief Complaint  Patient presents with   Constipation    Alyssa Boyd is a 81 y.o. female with PMH HLD, HTN, COPD, Parkinson Disease, who presents to ED c/o constipation with last bowel movement ~2 weeks ago. Pt has been trying to induce bowel movement using Miralax, prune juice, and beets but has been unsuccessful and over the last few days has developed left sided abdominal pain and distension. She describes pain as 7/10 and like a cramping pain with the urge to defecate. States the last time this happened was in ~ June of this year and she required medications in ED to have a bowel movement. She denies change in PO intake, vomiting, change in activity, chest pain, shortness of breath, change in her regular medications, or opioid use. She has not used any suppositories or enemas at home.      Home Medications Prior to Admission medications   Medication Sig Start Date End Date Taking? Authorizing Provider  albuterol (VENTOLIN HFA) 108 (90 Base) MCG/ACT inhaler Inhale 2 puffs into the lungs every 6 (six) hours as needed for wheezing or shortness of breath.    [provider]  aspirin EC 81 MG tablet Take 81 mg by mouth daily. Swallow whole.    [provider]  atorvastatin (LIPITOR) 20 MG tablet Take 20 mg by mouth daily.    [provider]  Calcium Carbonate-Vitamin D (CALCIUM-VITAMIN D3 PO) Take 5 mg by mouth daily.    [provider]  carbidopa-levodopa (SINEMET IR) 25-100 MG tablet Take 1 tablet by mouth 4 (four) times daily.    [provider]  Cholecalciferol (VITAMIN D3) 50 MCG (2000 UT) CHEW Chew 50 mcg by mouth daily.    [provider]  donepezil (ARICEPT) 5 MG tablet Take 5 mg by mouth at bedtime.    [provider]  fluticasone (FLONASE) 50 MCG/ACT nasal spray Place 2 sprays into  both nostrils at bedtime.    [provider]  fluticasone-salmeterol (ADVAIR) 250-50 MCG/ACT AEPB Inhale 1 puff into the lungs in the morning and at bedtime.    [provider]  furosemide (LASIX) 20 MG tablet Take 20 mg by mouth 2 (two) times daily.    [provider]  glycerin adult 2 g suppository Place 1 suppository rectally as needed for constipation. 12/14/21   Kommor, Madison, MD  levalbuterol Pauline Aus) 1.25 MG/3ML nebulizer solution Take 1.25 mg by nebulization every 6 (six) hours as needed for wheezing or shortness of breath.    [provider]  METOPROLOL TARTRATE PO Take 1 tablet by mouth 2 (two) times daily. No mg listed on mar    [provider]  montelukast (SINGULAIR) 10 MG tablet Take 10 mg by mouth at bedtime.    [provider]  Multiple Vitamins-Minerals (MULTI COMPLETE PO) Take 1 tablet by mouth daily.    [provider]  rOPINIRole (REQUIP) 4 MG tablet Take 8 mg by mouth at bedtime.    [provider]  telmisartan (MICARDIS) 40 MG tablet Take 40 mg by mouth daily.    [provider]  Tiotropium Bromide Monohydrate (SPIRIVA RESPIMAT) 2.5 MCG/ACT AERS Inhale 2 puffs into the lungs daily.    [provider]  venlafaxine (EFFEXOR) 75 MG tablet Take 225 mg by mouth daily.    [provider]  Allergies    Shellfish allergy    Review of Systems   Review of Systems  Constitutional:  Negative for activity change, appetite change, chills and fever.  HENT:  Negative for congestion, ear pain, rhinorrhea and sore throat.   Eyes:  Negative for pain and visual disturbance.  Respiratory:  Negative for cough, chest tightness and shortness of breath.   Cardiovascular:  Negative for chest pain, palpitations and leg swelling.  Gastrointestinal:  Positive for abdominal distention, abdominal pain and constipation. Negative for blood in stool, diarrhea, nausea, rectal pain and vomiting.   Genitourinary:  Negative for difficulty urinating, dysuria, flank pain and hematuria.  Musculoskeletal:  Negative for arthralgias, back pain and gait problem.  Skin:  Negative for color change and rash.  Neurological:  Negative for dizziness, seizures, syncope, light-headedness and headaches.  All other systems reviewed and are negative.   Physical Exam Updated Vital Signs Ht 5\' 5"  (1.651 m)   Wt 54 kg   BMI 19.81 kg/m  Physical Exam Vitals and nursing note reviewed.  Constitutional:      General: She is not in acute distress.    Appearance: Normal appearance. She is not ill-appearing or toxic-appearing.  HENT:     Head: Normocephalic and atraumatic.     Mouth/Throat:     Mouth: Mucous membranes are moist.  Eyes:     General: No scleral icterus.    Conjunctiva/sclera: Conjunctivae normal.  Cardiovascular:     Rate and Rhythm: Normal rate and regular rhythm.     Heart sounds: No murmur heard. Pulmonary:     Effort: Pulmonary effort is normal. No respiratory distress.     Breath sounds: Normal breath sounds. No wheezing, rhonchi or rales.  Abdominal:     General: Bowel sounds are decreased. There is distension.     Palpations: Abdomen is soft.     Tenderness: There is abdominal tenderness in the left upper quadrant and left lower quadrant. There is no guarding or rebound.  Musculoskeletal:        General: Normal range of motion.     Cervical back: Neck supple.     Right lower leg: No edema.     Left lower leg: No edema.  Skin:    General: Skin is warm and dry.     Capillary Refill: Capillary refill takes less than 2 seconds.  Neurological:     Mental Status: She is alert. Mental status is at baseline.  Psychiatric:        Behavior: Behavior normal.     ED Results / Procedures / Treatments   Labs (all labs ordered are listed, but only abnormal results are displayed) Labs Reviewed  CBC WITH DIFFERENTIAL/PLATELET  COMPREHENSIVE METABOLIC PANEL  LIPASE, BLOOD     EKG None  Radiology No results found.  Procedures None  Medications Ordered in ED Medications - No data to display  ED Course/ Medical Decision Making/ A&P                           Medical Decision Making Amount and/or Complexity of Data Reviewed Labs: ordered. Decision-making details documented in ED Course. Radiology: ordered. Decision-making details documented in ED Course.   81 year old female presenting with 2 weeks of constipation and acute onset of left sided abdominal pain and distension. Imaging and labs ordered to rule out bowel obstruction  as well as diverticulitis, colitis, hernia, electrolyte abnormalities, or other abdominal or infectious process though  low suspicion for these as pt is well appearing on exam and with a history of recurrent constipation requiring treatments such as lactulose, glycerin suppositories, and enemas. Workup pending at shift change with care transitioned to oncoming provider.          Final Clinical Impression(s) / ED Diagnoses Final diagnoses:  Constipation, unspecified constipation type    Rx / DC Orders ED Discharge Orders     None         Suzzette Righter, PA-C 06/22/22 1534    Audley Hose, MD 06/23/22 [phone]

## 2022-06-22 NOTE — ED Provider Notes (Signed)
Patient's care received at shift change from outgoing provider. In short, patient is a 81 year old female with past medical history of hyperlipidemia, hypertension, COPD, Parkinson disease presenting to the emergency for evaluation of constipation with last bowel movement 2 weeks ago.  Patient has been trying to induce bowel movement using MiraLAX, prune juice and beets but has been unsuccessful and over the last few days has developed left-sided abdominal pain and distention.  States the last time this happened was in June of this year and she required medications in the ED to have a bowel movement.  She denies change in p.o. intake, vomiting, change in activity, chest pain, shortness of breath, change in medications. Physical Exam  BP 135/66 (BP Location: Right Arm)   Pulse 94   Resp 16   Ht 5\' 5"  (1.651 m)   Wt 54 kg   SpO2 97%   BMI 19.81 kg/m   Physical Exam Vitals and nursing note reviewed.  Constitutional:      Appearance: Normal appearance.  HENT:     Head: Normocephalic and atraumatic.     Mouth/Throat:     Mouth: Mucous membranes are moist.  Eyes:     General: No scleral icterus. Cardiovascular:     Rate and Rhythm: Normal rate and regular rhythm.     Pulses: Normal pulses.     Heart sounds: Normal heart sounds.  Pulmonary:     Effort: Pulmonary effort is normal.     Breath sounds: Normal breath sounds.  Abdominal:     General: Abdomen is flat. There is distension.     Palpations: Abdomen is soft.     Tenderness: There is no abdominal tenderness.  Musculoskeletal:        General: No deformity.  Skin:    General: Skin is warm.     Findings: No rash.  Neurological:     General: No focal deficit present.     Mental Status: She is alert.  Psychiatric:        Mood and Affect: Mood normal.     Procedures  Procedures  ED Course / MDM    Medical Decision Making Amount and/or Complexity of Data Reviewed Labs: ordered. Radiology: ordered.  Risk OTC  drugs. Prescription drug management.   On physical examination, there was abdominal tenderness in the left upper quadrant and left lower quadrant.  The abdomen seems to be distended.  CT scan showed large amount of stool is seen in the distal sigmoid colon and rectum concerning for impaction.  I ordered lactulose and glycerin suppository and we will reevaluate patient.  If medications do not work will order bowel care or enema. 8:51 PM Patient still does not have a bowel movement.  I ordered enema. Nurse notified.  10:26 PM Reevaluated patient. She reports significant relief after enema. I will send her home with lactulose and glycerin suppository. She tolerates PO, no nausea or vomiting. She is stable for discharge.  I advised patient to take lactulose and use glycerin suppository for constipation.  Follow-up with primary care physician for further evaluation and management.  Return to the ER if new or worsening symptoms.  Continued outpatient therapy. Follow-up with PCP recommended for reevaluation of symptoms. Treatment plan discussed with patient.  Pt acknowledged understanding was agreeable to the plan. Worrisome signs and symptoms were discussed with patient, and patient acknowledged understanding to return to the ED if they noticed these signs and symptoms. Patient was stable upon discharge.   This chart was dictated using  voice recognition software.  Despite best efforts to proofread,  errors can occur which can change the documentation meaning.           Jeanelle Malling, PA 06/22/22 2357    Loetta Rough, MD 06/24/22 [phone]

## 2022-06-22 NOTE — ED Notes (Signed)
Patient had a bowel movement after soap enema. Patient cleaned up at this time.

## 2022-06-22 NOTE — ED Triage Notes (Signed)
Pt BIB EMS from [facility] with abdominal pain and no BM for 2 weeks. Left sided abd tenderness and cramping. On medication for constipation but says it is not working.  Aox4.

## 2022-08-29 ENCOUNTER — Encounter (HOSPITAL_COMMUNITY): Payer: Self-pay | Admitting: Emergency Medicine

## 2022-08-29 ENCOUNTER — Other Ambulatory Visit: Payer: Self-pay

## 2022-08-29 ENCOUNTER — Inpatient Hospital Stay (HOSPITAL_COMMUNITY)
Admission: EM | Admit: 2022-08-29 | Discharge: 2022-09-06 | DRG: 329 | Disposition: A | Discharge status: Skilled Nursing Facility | Payer: Medicare Other | Attending: Internal Medicine | Admitting: Internal Medicine

## 2022-08-29 ENCOUNTER — Emergency Department (HOSPITAL_COMMUNITY): Payer: Medicare Other

## 2022-08-29 DIAGNOSIS — S3720XA Unspecified injury of bladder, initial encounter: Secondary | ICD-10-CM

## 2022-08-29 DIAGNOSIS — J449 Chronic obstructive pulmonary disease, unspecified: Secondary | ICD-10-CM | POA: Diagnosis present

## 2022-08-29 DIAGNOSIS — J95821 Acute postprocedural respiratory failure: Secondary | ICD-10-CM | POA: Diagnosis not present

## 2022-08-29 DIAGNOSIS — K660 Peritoneal adhesions (postprocedural) (postinfection): Secondary | ICD-10-CM | POA: Diagnosis present

## 2022-08-29 DIAGNOSIS — Z79899 Other long term (current) drug therapy: Secondary | ICD-10-CM

## 2022-08-29 DIAGNOSIS — F05 Delirium due to known physiological condition: Secondary | ICD-10-CM | POA: Diagnosis not present

## 2022-08-29 DIAGNOSIS — E875 Hyperkalemia: Secondary | ICD-10-CM | POA: Diagnosis not present

## 2022-08-29 DIAGNOSIS — G20A1 Parkinson's disease without dyskinesia, without mention of fluctuations: Secondary | ICD-10-CM | POA: Diagnosis present

## 2022-08-29 DIAGNOSIS — R1030 Lower abdominal pain, unspecified: Principal | ICD-10-CM

## 2022-08-29 DIAGNOSIS — E119 Type 2 diabetes mellitus without complications: Secondary | ICD-10-CM | POA: Diagnosis present

## 2022-08-29 DIAGNOSIS — E785 Hyperlipidemia, unspecified: Secondary | ICD-10-CM | POA: Diagnosis present

## 2022-08-29 DIAGNOSIS — N1330 Unspecified hydronephrosis: Secondary | ICD-10-CM | POA: Diagnosis present

## 2022-08-29 DIAGNOSIS — Z751 Person awaiting admission to adequate facility elsewhere: Secondary | ICD-10-CM

## 2022-08-29 DIAGNOSIS — G9341 Metabolic encephalopathy: Secondary | ICD-10-CM | POA: Diagnosis not present

## 2022-08-29 DIAGNOSIS — Z7951 Long term (current) use of inhaled steroids: Secondary | ICD-10-CM

## 2022-08-29 DIAGNOSIS — Y733 Surgical instruments, materials and gastroenterology and urology devices (including sutures) associated with adverse incidents: Secondary | ICD-10-CM | POA: Diagnosis not present

## 2022-08-29 DIAGNOSIS — N9972 Accidental puncture and laceration of a genitourinary system organ or structure during other procedure: Secondary | ICD-10-CM | POA: Diagnosis not present

## 2022-08-29 DIAGNOSIS — D6489 Other specified anemias: Secondary | ICD-10-CM | POA: Diagnosis present

## 2022-08-29 DIAGNOSIS — N823 Fistula of vagina to large intestine: Secondary | ICD-10-CM | POA: Diagnosis present

## 2022-08-29 DIAGNOSIS — Z781 Physical restraint status: Secondary | ICD-10-CM

## 2022-08-29 DIAGNOSIS — Z91013 Allergy to seafood: Secondary | ICD-10-CM

## 2022-08-29 DIAGNOSIS — E876 Hypokalemia: Secondary | ICD-10-CM | POA: Diagnosis not present

## 2022-08-29 DIAGNOSIS — Z1152 Encounter for screening for COVID-19: Secondary | ICD-10-CM

## 2022-08-29 DIAGNOSIS — Y658 Other specified misadventures during surgical and medical care: Secondary | ICD-10-CM | POA: Diagnosis not present

## 2022-08-29 DIAGNOSIS — K631 Perforation of intestine (nontraumatic): Secondary | ICD-10-CM | POA: Diagnosis not present

## 2022-08-29 DIAGNOSIS — A419 Sepsis, unspecified organism: Secondary | ICD-10-CM

## 2022-08-29 DIAGNOSIS — Z993 Dependence on wheelchair: Secondary | ICD-10-CM

## 2022-08-29 DIAGNOSIS — I10 Essential (primary) hypertension: Secondary | ICD-10-CM | POA: Diagnosis present

## 2022-08-29 DIAGNOSIS — Z7982 Long term (current) use of aspirin: Secondary | ICD-10-CM

## 2022-08-29 LAB — ABO/RH: ABO/RH(D): O POS

## 2022-08-29 MED ORDER — METRONIDAZOLE 500 MG/100ML IV SOLN
500.0000 mg | Freq: Once | INTRAVENOUS | Status: AC
  Administered 2022-08-30: 500 mg via INTRAVENOUS
  Filled 2022-08-29: qty 100

## 2022-08-29 MED ORDER — LACTATED RINGERS IV SOLN: INTRAVENOUS | Status: AC

## 2022-08-29 MED ORDER — LACTATED RINGERS IV BOLUS (SEPSIS)
500.0000 mL | Freq: Once | INTRAVENOUS | Status: AC
  Administered 2022-08-29: 500 mL via INTRAVENOUS

## 2022-08-29 MED ORDER — LACTATED RINGERS IV BOLUS (SEPSIS)
250.0000 mL | Freq: Once | INTRAVENOUS | Status: AC
  Administered 2022-08-30: 250 mL via INTRAVENOUS

## 2022-08-29 MED ORDER — VANCOMYCIN HCL IN DEXTROSE 1-5 GM/200ML-% IV SOLN
1000.0000 mg | Freq: Once | INTRAVENOUS | Status: AC
  Administered 2022-08-30: 1000 mg via INTRAVENOUS
  Filled 2022-08-29: qty 200

## 2022-08-29 MED ORDER — LACTATED RINGERS IV BOLUS (SEPSIS)
1000.0000 mL | Freq: Once | INTRAVENOUS | Status: AC
  Administered 2022-08-29: 1000 mL via INTRAVENOUS

## 2022-08-29 MED ORDER — SODIUM CHLORIDE 0.9 % IV SOLN
2.0000 g | Freq: Once | INTRAVENOUS | Status: AC
  Administered 2022-08-29: 2 g via INTRAVENOUS
  Filled 2022-08-29: qty 12.5

## 2022-08-29 NOTE — Sepsis Progress Note (Signed)
Elink monitoring for the code sepsis protocol.  

## 2022-08-29 NOTE — ED Triage Notes (Signed)
Pt BIB EMS from [facility] with rectal bleeding tonight. Per staff, pt had bright and dark bloody stool that filled her diaper. Denies dizziness or hypotension. Hx of dementia

## 2022-08-29 NOTE — ED Provider Notes (Signed)
[place] Provider Note   CSN: [id number] Arrival date & time: 08/29/22  2157     History  Chief Complaint  Patient presents with   Rectal Bleeding    Alyssa Boyd is a 82 y.o. female.   Rectal Bleeding Quality:  Bright red and maroon Amount:  Moderate Duration:  1 day Timing:  Constant Context: constipation   Context: not diarrhea, not hemorrhoids (prior but not now), not rectal injury and not rectal pain   Similar prior episodes: no   Associated symptoms: abdominal pain and fever   Associated symptoms: no dizziness, no light-headedness, no recent illness and no vomiting        Home Medications Prior to Admission medications   Medication Sig Start Date End Date Taking? Authorizing Provider  albuterol (VENTOLIN HFA) 108 (90 Base) MCG/ACT inhaler Inhale 2 puffs into the lungs every 6 (six) hours as needed for wheezing or shortness of breath.    [provider]  aspirin EC 81 MG tablet Take 81 mg by mouth daily. Swallow whole.    [provider]  atorvastatin (LIPITOR) 20 MG tablet Take 20 mg by mouth daily.    [provider]  Calcium Carbonate-Vitamin D (CALCIUM-VITAMIN D3 PO) Take 5 mg by mouth daily.    [provider]  carbidopa-levodopa (SINEMET IR) 25-100 MG tablet Take 1 tablet by mouth 4 (four) times daily.    [provider]  Cholecalciferol (VITAMIN D3) 50 MCG (2000 UT) CHEW Chew 50 mcg by mouth daily.    [provider]  donepezil (ARICEPT) 5 MG tablet Take 5 mg by mouth at bedtime.    [provider]  fluticasone (FLONASE) 50 MCG/ACT nasal spray Place 2 sprays into both nostrils at bedtime.    [provider]  fluticasone-salmeterol (ADVAIR) 250-50 MCG/ACT AEPB Inhale 1 puff into the lungs in the morning and at bedtime.    [provider]  furosemide (LASIX) 20 MG tablet Take 20 mg by mouth 2 (two) times daily.    [provider]  glycerin adult 2 g suppository Place 1 suppository rectally as needed for constipation. 12/14/21   Kommor, Madison, MD  glycerin adult 2 g suppository Place 1 suppository rectally as needed for constipation. 06/22/22   Rex Kras, PA  lactulose (CEPHULAC) 20 g packet Take 1 packet (20 g total) by mouth 3 (three) times daily. 06/22/22   Rex Kras, PA  levalbuterol (XOPENEX) 1.25 MG/3ML nebulizer solution Take 1.25 mg by nebulization every 6 (six) hours as needed for wheezing or shortness of breath.    [provider]  METOPROLOL TARTRATE PO Take 1 tablet by mouth 2 (two) times daily. No mg listed on mar    [provider]  montelukast (SINGULAIR) 10 MG tablet Take 10 mg by mouth at bedtime.    [provider]  Multiple Vitamins-Minerals (MULTI COMPLETE PO) Take 1 tablet by mouth daily.    [provider]  rOPINIRole (REQUIP) 4 MG tablet Take 8 mg by mouth at bedtime.    [provider]  telmisartan (MICARDIS) 40 MG tablet Take 40 mg by mouth daily.    [provider]  Tiotropium Bromide Monohydrate (SPIRIVA RESPIMAT) 2.5 MCG/ACT AERS Inhale 2 puffs into the lungs daily.    [provider]  venlafaxine (EFFEXOR) 75 MG tablet Take 225 mg by mouth daily.    [provider]      Allergies    Shellfish  allergy    Review of Systems   Review of Systems  Constitutional:  Positive for fatigue and fever. Negative for chills.  HENT:  Negative for congestion.   Respiratory:  Negative for cough, chest tightness, shortness of breath and wheezing.   Cardiovascular:  Negative for chest pain, palpitations and leg swelling.  Gastrointestinal:  Positive for abdominal pain, anal bleeding, constipation and hematochezia. Negative for abdominal distention, diarrhea, nausea and vomiting.  Genitourinary:  Positive for dysuria. Negative for flank pain and frequency.  Musculoskeletal:  Negative for back pain, neck pain and neck stiffness.  Skin:   Negative for rash and wound.  Neurological:  Negative for dizziness, weakness, light-headedness, numbness and headaches.  Psychiatric/Behavioral:  Negative for agitation and confusion.   All other systems reviewed and are negative.   Physical Exam Updated Vital Signs BP 131/72   Pulse (!) 120   Temp (!) 102.8 F (39.3 C)   Resp 20   Ht 5' 5"$  (1.651 m)   Wt 54 kg   SpO2 94%   BMI 19.81 kg/m  Physical Exam Vitals and nursing note reviewed.  Constitutional:      General: She is not in acute distress.    Appearance: She is well-developed. She is not ill-appearing, toxic-appearing or diaphoretic.  HENT:     Head: Normocephalic and atraumatic.     Nose: Nose normal. No congestion or rhinorrhea.     Mouth/Throat:     Mouth: Mucous membranes are moist.     Pharynx: No oropharyngeal exudate or posterior oropharyngeal erythema.  Eyes:     Extraocular Movements: Extraocular movements intact.     Conjunctiva/sclera: Conjunctivae normal.     Pupils: Pupils are equal, round, and reactive to light.  Cardiovascular:     Rate and Rhythm: Regular rhythm. Tachycardia present.     Heart sounds: No murmur heard. Pulmonary:     Effort: Pulmonary effort is normal. No respiratory distress.     Breath sounds: Normal breath sounds. No wheezing, rhonchi or rales.  Chest:     Chest wall: No tenderness.  Abdominal:     Palpations: Abdomen is soft.     Tenderness: There is abdominal tenderness.  Genitourinary:    Comments: Maroon stool concernin for bleeding. No tenderness. No large hemorrhoids.  Musculoskeletal:        General: No swelling or tenderness.     Cervical back: Neck supple.     Right lower leg: No edema.     Left lower leg: No edema.  Skin:    General: Skin is warm and dry.     Capillary Refill: Capillary refill takes less than 2 seconds.     Findings: No erythema or rash.  Neurological:     General: No focal deficit present.     Mental Status: She is alert.     Sensory: No  sensory deficit.     Motor: No weakness.  Psychiatric:        Mood and Affect: Mood normal.     ED Results / Procedures / Treatments   Labs (all labs ordered are listed, but only abnormal results are displayed) Labs Reviewed  RESP PANEL BY RT-PCR (RSV, FLU A&B, COVID)  [id number]  CULTURE, BLOOD (ROUTINE X 2)  CULTURE, BLOOD (ROUTINE X 2)  URINE CULTURE  LACTIC ACID, PLASMA  LACTIC ACID, PLASMA  COMPREHENSIVE METABOLIC PANEL  CBC WITH DIFFERENTIAL/PLATELET  PROTIME-INR  APTT  URINALYSIS, ROUTINE W REFLEX MICROSCOPIC  LIPASE, BLOOD  POC OCCULT BLOOD, ED  I-STAT CHEM 8, ED  TYPE AND SCREEN  ABO/RH    EKG EKG Interpretation  Date/Time:  Tuesday August 29 2022 22:07:43 EST Ventricular Rate:  120 PR Interval:    QRS Duration: 91 QT Interval:  304 QTC Calculation: 430 R Axis:   31 Text Interpretation: Atrial fibrillation Low voltage, precordial leads when compareed to prior, more artifact with sinus tachycaria. No STEMI Confirmed by Antony Blackbird [phone]) on 08/29/2022 10:12:54 PM  Radiology DG Chest Port 1 View  Result Date: 08/29/2022 CLINICAL DATA:  Sepsis, rectal bleeding EXAM: PORTABLE CHEST 1 VIEW COMPARISON:  09/20/2021 FINDINGS: Single frontal view of the chest demonstrates an unremarkable cardiac silhouette. Ectasia of the thoracic aorta. No acute airspace disease, effusion, or pneumothorax. No acute bony abnormalities. IMPRESSION: 1. No acute intrathoracic process. Electronically Signed   By: Randa Ngo M.D.   On: 08/29/2022 23:00    Procedures Procedures    CRITICAL CARE Performed by: Gwenyth Allegra Kadar Chance Total critical care time: 35 minutes Critical care time was exclusive of separately billable procedures and treating other patients. Critical care was necessary to treat or prevent imminent or life-threatening deterioration. Critical care was time spent personally by me on the following activities: development of treatment plan with patient and/or  surrogate as well as nursing, discussions with consultants, evaluation of patient's response to treatment, examination of patient, obtaining history from patient or surrogate, ordering and performing treatments and interventions, ordering and review of laboratory studies, ordering and review of radiographic studies, pulse oximetry and re-evaluation of patient's condition.   Medications Ordered in ED Medications  lactated ringers infusion (has no administration in time range)  lactated ringers bolus 1,000 mL (1,000 mLs Intravenous New Bag/Given 08/29/22 2259)    And  lactated ringers bolus 500 mL (500 mLs Intravenous New Bag/Given 08/29/22 2312)    And  lactated ringers bolus 250 mL (has no administration in time range)  ceFEPIme (MAXIPIME) 2 g in sodium chloride 0.9 % 100 mL IVPB (2 g Intravenous New Bag/Given 08/29/22 2308)  metroNIDAZOLE (FLAGYL) IVPB 500 mg (has no administration in time range)  vancomycin (VANCOCIN) IVPB 1000 mg/200 mL premix (has no administration in time range)    ED Course/ Medical Decision Making/ A&P                             Medical Decision Making Amount and/or Complexity of Data Reviewed Labs: ordered. Radiology: ordered. ECG/medicine tests: ordered.  Risk Prescription drug management.    Santania Hohnstein is a 82 y.o. female with a past medical history significant for hypertension, hyperlipidemia, diabetes, COPD, Parkinson's, and previous cholecystectomy per patient presents with several days of lower abdominal pain, subjective fevers, chills, and rectal bleeding today.  According to patient, she has felt bad with lower abdominal discomfort for the last few days as well as some dysuria.  She says she is the pain is moderate and worse when she gets her abdomen press.  She says that today they noticed some blood in her diaper and sent her for evaluation.  Otherwise she reports dysuria but denies hematuria.  Denies any trauma.  Denies nausea, or vomiting but does  report some chills and fever.  She denies any cough, congestion, or URI symptoms.  Unclear if there are any sick contacts.  No rashes reported no pelvic or groin symptoms reported and no vaginal bleeding or discharge.  On exam, lower abdomen is quite tender across the lower abdomen.  Bowel sounds are appreciated.  Flanks and back nontender.  No rash seen initially.  Patient denied any GU symptoms, will defer GU exam initially.  Rectal exam was performed and she does have some maroon looking stool.  Fecal occult was collected.  Clinically given the patient's fevers and vital signs I am concerned about sepsis.  She was febrile, tachycardic, and tachypneic on my evaluation.  Oxygen saturations were in the mid 90s and she was not hypoxic.  Given the tenderness and now septic vital signs we will make her a code sepsis and will get CT imaging to rule out pyelonephritis, diverticulitis, obstruction, or other intra-abdominal pathology such as appendicitis.  Will give her fluids and will get screening labs and urinalysis.  Will get COVID swabs.  Care transferred to oncoming team to wait for workup results.  Anticipate admission for sepsis and 82 year old with abdominal pain.             Final Clinical Impression(s) / ED Diagnoses Final diagnoses:  Lower abdominal pain  Sepsis, due to unspecified organism, unspecified whether acute organ dysfunction present [facility])   Clinical Impression: 1. Lower abdominal pain   2. Sepsis, due to unspecified organism, unspecified whether acute organ dysfunction present [facility])     Disposition: Admit  This note was prepared with assistance of Dragon voice recognition software. Occasional wrong-word or sound-a-like substitutions may have occurred due to the inherent limitations of voice recognition software.       Ermon Sagan, Gwenyth Allegra, MD 08/29/22 [phone]

## 2022-08-29 NOTE — Progress Notes (Signed)
Pharmacy Antibiotic Note  Alyssa Boyd is a 82 y.o. female for which pharmacy has been consulted for cefepime and vancomycin dosing for sepsis.  Patient with a history of HLD, HTN, COPD, Parkinson's. Patient presenting with rectal bleeding.  Labs are pending T 102.8; HR 120; RR 20  Plan: Cefepime 2g once and vancomycin 1g once entered to be given in ED Maintenance dosing per labs to follow Trend WBC, Fever, Renal function F/u cultures, clinical course, WBC De-escalate when able  Height: 5' 5"$  (165.1 cm) Weight: 54 kg (119 lb 0.8 oz) IBW/kg (Calculated) : 57  Temp (24hrs), Avg:102.8 F (39.3 C), Min:102.8 F (39.3 C), Max:102.8 F (39.3 C)  No results for input(s): "WBC", "CREATININE", "LATICACIDVEN", "VANCOTROUGH", "VANCOPEAK", "VANCORANDOM", "GENTTROUGH", "GENTPEAK", "GENTRANDOM", "TOBRATROUGH", "TOBRAPEAK", "TOBRARND", "AMIKACINPEAK", "AMIKACINTROU", "AMIKACIN" in the last 168 hours.  CrCl cannot be calculated (Patient's most recent lab result is older than the maximum 21 days allowed.).    Allergies  Allergen Reactions   Shellfish Allergy    Microbiology results: Pending  Thank you for allowing pharmacy to be a part of this patient's care.  Lorelei Pont, PharmD, BCPS 08/29/2022 10:49 PM ED Clinical Pharmacist -  [phone]

## 2022-08-29 NOTE — ED Notes (Signed)
Daughter Anderson Malta [phone] would like an update asap

## 2022-08-30 ENCOUNTER — Emergency Department (HOSPITAL_COMMUNITY): Payer: Medicare Other | Admitting: Anesthesiology

## 2022-08-30 ENCOUNTER — Emergency Department (HOSPITAL_COMMUNITY): Payer: Medicare Other

## 2022-08-30 ENCOUNTER — Encounter (HOSPITAL_COMMUNITY)
Admission: EM | Disposition: A | Discharge status: Skilled Nursing Facility | Payer: Self-pay | Source: Home / Self Care | Attending: Internal Medicine

## 2022-08-30 ENCOUNTER — Encounter (HOSPITAL_COMMUNITY): Payer: Self-pay | Admitting: Anesthesiology

## 2022-08-30 DIAGNOSIS — N823 Fistula of vagina to large intestine: Secondary | ICD-10-CM | POA: Diagnosis present

## 2022-08-30 DIAGNOSIS — K631 Perforation of intestine (nontraumatic): Secondary | ICD-10-CM

## 2022-08-30 DIAGNOSIS — E785 Hyperlipidemia, unspecified: Secondary | ICD-10-CM | POA: Diagnosis present

## 2022-08-30 DIAGNOSIS — D649 Anemia, unspecified: Secondary | ICD-10-CM | POA: Diagnosis not present

## 2022-08-30 DIAGNOSIS — N828 Other female genital tract fistulae: Secondary | ICD-10-CM

## 2022-08-30 DIAGNOSIS — N9972 Accidental puncture and laceration of a genitourinary system organ or structure during other procedure: Secondary | ICD-10-CM | POA: Diagnosis not present

## 2022-08-30 DIAGNOSIS — Z751 Person awaiting admission to adequate facility elsewhere: Secondary | ICD-10-CM | POA: Diagnosis not present

## 2022-08-30 DIAGNOSIS — F05 Delirium due to known physiological condition: Secondary | ICD-10-CM | POA: Diagnosis not present

## 2022-08-30 DIAGNOSIS — N1330 Unspecified hydronephrosis: Secondary | ICD-10-CM | POA: Diagnosis present

## 2022-08-30 DIAGNOSIS — J449 Chronic obstructive pulmonary disease, unspecified: Secondary | ICD-10-CM | POA: Diagnosis present

## 2022-08-30 DIAGNOSIS — J95821 Acute postprocedural respiratory failure: Secondary | ICD-10-CM | POA: Diagnosis not present

## 2022-08-30 DIAGNOSIS — E119 Type 2 diabetes mellitus without complications: Secondary | ICD-10-CM | POA: Diagnosis present

## 2022-08-30 DIAGNOSIS — E875 Hyperkalemia: Secondary | ICD-10-CM | POA: Diagnosis not present

## 2022-08-30 DIAGNOSIS — Z1152 Encounter for screening for COVID-19: Secondary | ICD-10-CM | POA: Diagnosis not present

## 2022-08-30 DIAGNOSIS — Y658 Other specified misadventures during surgical and medical care: Secondary | ICD-10-CM | POA: Diagnosis not present

## 2022-08-30 DIAGNOSIS — G9341 Metabolic encephalopathy: Secondary | ICD-10-CM | POA: Diagnosis not present

## 2022-08-30 DIAGNOSIS — Z79899 Other long term (current) drug therapy: Secondary | ICD-10-CM | POA: Diagnosis not present

## 2022-08-30 DIAGNOSIS — G20A1 Parkinson's disease without dyskinesia, without mention of fluctuations: Secondary | ICD-10-CM | POA: Diagnosis present

## 2022-08-30 DIAGNOSIS — Z993 Dependence on wheelchair: Secondary | ICD-10-CM | POA: Diagnosis not present

## 2022-08-30 DIAGNOSIS — R1030 Lower abdominal pain, unspecified: Secondary | ICD-10-CM | POA: Diagnosis present

## 2022-08-30 DIAGNOSIS — D6489 Other specified anemias: Secondary | ICD-10-CM | POA: Diagnosis present

## 2022-08-30 DIAGNOSIS — I10 Essential (primary) hypertension: Secondary | ICD-10-CM

## 2022-08-30 DIAGNOSIS — Z7982 Long term (current) use of aspirin: Secondary | ICD-10-CM | POA: Diagnosis not present

## 2022-08-30 DIAGNOSIS — E876 Hypokalemia: Secondary | ICD-10-CM | POA: Diagnosis not present

## 2022-08-30 DIAGNOSIS — K660 Peritoneal adhesions (postprocedural) (postinfection): Secondary | ICD-10-CM | POA: Diagnosis present

## 2022-08-30 DIAGNOSIS — Y733 Surgical instruments, materials and gastroenterology and urology devices (including sutures) associated with adverse incidents: Secondary | ICD-10-CM | POA: Diagnosis not present

## 2022-08-30 HISTORY — PX: COLOSTOMY REVISION: SHX5232

## 2022-08-30 HISTORY — PX: COLOSTOMY: SHX63

## 2022-08-30 HISTORY — PX: REPAIR VAGINAL CUFF: SHX6067

## 2022-08-30 HISTORY — PX: BLADDER REPAIR: SHX6721

## 2022-08-30 HISTORY — PX: LAPAROTOMY: SHX154

## 2022-08-30 LAB — URINALYSIS, ROUTINE W REFLEX MICROSCOPIC
Bacteria, UA: NONE SEEN
Bilirubin Urine: NEGATIVE
Glucose, UA: 150 mg/dL — AB
Ketones, ur: NEGATIVE mg/dL
Nitrite: NEGATIVE
Protein, ur: NEGATIVE mg/dL
RBC / HPF: >50 RBC/hpf (ref 0–5)
Specific Gravity, Urine: 1.019 (ref 1.005–1.030)
pH: 6 (ref 5.0–8.0)

## 2022-08-30 LAB — COMPREHENSIVE METABOLIC PANEL
ALT: 6 U/L (ref 0–44)
AST: 15 U/L (ref 15–41)
Albumin: 2.7 g/dL — ABNORMAL LOW (ref 3.5–5.0)
Alkaline Phosphatase: 113 U/L (ref 38–126)
Anion gap: 11 (ref 5–15)
BUN: 11 mg/dL (ref 8–23)
CO2: 30 mmol/L (ref 22–32)
Calcium: 9.1 mg/dL (ref 8.9–10.3)
Chloride: 93 mmol/L — ABNORMAL LOW (ref 98–111)
Creatinine, Ser: 0.92 mg/dL (ref 0.44–1.00)
GFR, Estimated: >60 mL/min (ref >60)
Glucose, Bld: 170 mg/dL — ABNORMAL HIGH (ref 70–99)
Potassium: 3.7 mmol/L (ref 3.5–5.1)
Sodium: 134 mmol/L — ABNORMAL LOW (ref 135–145)
Total Bilirubin: 0.1 mg/dL — ABNORMAL LOW (ref 0.3–1.2)
Total Protein: 7 g/dL (ref 6.5–8.1)

## 2022-08-30 LAB — PROTIME-INR
INR: 1.1 (ref 0.8–1.2)
Prothrombin Time: 14.3 seconds (ref 11.4–15.2)

## 2022-08-30 LAB — CBC
HCT: 24 % — ABNORMAL LOW (ref 36.0–46.0)
HCT: 29.6 % — ABNORMAL LOW (ref 36.0–46.0)
Hemoglobin: 8.6 g/dL — ABNORMAL LOW (ref 12.0–15.0)
Hemoglobin: 10.2 g/dL — ABNORMAL LOW (ref 12.0–15.0)
MCH: 33.1 pg (ref 26.0–34.0)
MCH: 30.3 pg (ref 26.0–34.0)
MCHC: 35.8 g/dL (ref 30.0–36.0)
MCHC: 34.5 g/dL (ref 30.0–36.0)
MCV: 92.3 fL (ref 80.0–100.0)
MCV: 87.8 fL (ref 80.0–100.0)
Platelets: 330 10*3/uL (ref 150–400)
Platelets: 253 10*3/uL (ref 150–400)
RBC: 2.6 MIL/uL — ABNORMAL LOW (ref 3.87–5.11)
RBC: 3.37 MIL/uL — ABNORMAL LOW (ref 3.87–5.11)
RDW: 14 % (ref 11.5–15.5)
RDW: 15 % (ref 11.5–15.5)
WBC: 15.4 10*3/uL — ABNORMAL HIGH (ref 4.0–10.5)
WBC: 13.5 10*3/uL — ABNORMAL HIGH (ref 4.0–10.5)
nRBC: 0 % (ref 0.0–0.2)
nRBC: 0 % (ref 0.0–0.2)

## 2022-08-30 LAB — CBC WITH DIFFERENTIAL/PLATELET
Abs Immature Granulocytes: 0.09 10*3/uL — ABNORMAL HIGH (ref 0.00–0.07)
Basophils Absolute: 0 10*3/uL (ref 0.0–0.1)
Basophils Relative: 0 %
Eosinophils Absolute: 0 10*3/uL (ref 0.0–0.5)
Eosinophils Relative: 0 %
HCT: 30 % — ABNORMAL LOW (ref 36.0–46.0)
Hemoglobin: 10 g/dL — ABNORMAL LOW (ref 12.0–15.0)
Immature Granulocytes: 1 %
Lymphocytes Relative: 11 %
Lymphs Abs: 1.3 10*3/uL (ref 0.7–4.0)
MCH: 30 pg (ref 26.0–34.0)
MCHC: 33.3 g/dL (ref 30.0–36.0)
MCV: 90.1 fL (ref 80.0–100.0)
Monocytes Absolute: 0.9 10*3/uL (ref 0.1–1.0)
Monocytes Relative: 7 %
Neutro Abs: 10.1 10*3/uL — ABNORMAL HIGH (ref 1.7–7.7)
Neutrophils Relative %: 81 %
Platelets: 434 10*3/uL — ABNORMAL HIGH (ref 150–400)
RBC: 3.33 MIL/uL — ABNORMAL LOW (ref 3.87–5.11)
RDW: 14 % (ref 11.5–15.5)
WBC: 12.5 10*3/uL — ABNORMAL HIGH (ref 4.0–10.5)
nRBC: 0 % (ref 0.0–0.2)

## 2022-08-30 LAB — MRSA NEXT GEN BY PCR, NASAL: MRSA by PCR Next Gen: NOT DETECTED

## 2022-08-30 LAB — I-STAT CHEM 8, ED
BUN: 12 mg/dL (ref 8–23)
BUN: 15 mg/dL (ref 8–23)
Calcium, Ion: 1.07 mmol/L — ABNORMAL LOW (ref 1.15–1.40)
Calcium, Ion: 1.1 mmol/L — ABNORMAL LOW (ref 1.15–1.40)
Chloride: 94 mmol/L — ABNORMAL LOW (ref 98–111)
Chloride: 97 mmol/L — ABNORMAL LOW (ref 98–111)
Creatinine, Ser: 0.7 mg/dL (ref 0.44–1.00)
Creatinine, Ser: 0.9 mg/dL (ref 0.44–1.00)
Glucose, Bld: 159 mg/dL — ABNORMAL HIGH (ref 70–99)
Glucose, Bld: 177 mg/dL — ABNORMAL HIGH (ref 70–99)
HCT: 24 % — ABNORMAL LOW (ref 36.0–46.0)
HCT: 32 % — ABNORMAL LOW (ref 36.0–46.0)
Hemoglobin: 10.9 g/dL — ABNORMAL LOW (ref 12.0–15.0)
Hemoglobin: 8.2 g/dL — ABNORMAL LOW (ref 12.0–15.0)
Potassium: 5.6 mmol/L — ABNORMAL HIGH (ref 3.5–5.1)
Potassium: 6.6 mmol/L (ref 3.5–5.1)
Sodium: 130 mmol/L — ABNORMAL LOW (ref 135–145)
Sodium: 130 mmol/L — ABNORMAL LOW (ref 135–145)
TCO2: 29 mmol/L (ref 22–32)
TCO2: 32 mmol/L (ref 22–32)

## 2022-08-30 LAB — POC OCCULT BLOOD, ED: Fecal Occult Bld: POSITIVE — AB

## 2022-08-30 LAB — LIPASE, BLOOD: Lipase: 31 U/L (ref 11–51)

## 2022-08-30 LAB — BASIC METABOLIC PANEL
Anion gap: 7 (ref 5–15)
Anion gap: 8 (ref 5–15)
BUN: 6 mg/dL — ABNORMAL LOW (ref 8–23)
BUN: 5 mg/dL — ABNORMAL LOW (ref 8–23)
CO2: 22 mmol/L (ref 22–32)
CO2: 24 mmol/L (ref 22–32)
Calcium: 7.8 mg/dL — ABNORMAL LOW (ref 8.9–10.3)
Calcium: 8.3 mg/dL — ABNORMAL LOW (ref 8.9–10.3)
Chloride: 108 mmol/L (ref 98–111)
Chloride: 106 mmol/L (ref 98–111)
Creatinine, Ser: 0.72 mg/dL (ref 0.44–1.00)
Creatinine, Ser: 0.65 mg/dL (ref 0.44–1.00)
GFR, Estimated: >60 mL/min (ref >60)
GFR, Estimated: >60 mL/min (ref >60)
Glucose, Bld: 233 mg/dL — ABNORMAL HIGH (ref 70–99)
Glucose, Bld: 232 mg/dL — ABNORMAL HIGH (ref 70–99)
Potassium: 3.3 mmol/L — ABNORMAL LOW (ref 3.5–5.1)
Potassium: 3 mmol/L — ABNORMAL LOW (ref 3.5–5.1)
Sodium: 137 mmol/L (ref 135–145)
Sodium: 138 mmol/L (ref 135–145)

## 2022-08-30 LAB — RESP PANEL BY RT-PCR (RSV, FLU A&B, COVID)  RVPGX2
Influenza A by PCR: NEGATIVE
Influenza B by PCR: NEGATIVE
Resp Syncytial Virus by PCR: NEGATIVE
SARS Coronavirus 2 by RT PCR: NEGATIVE

## 2022-08-30 LAB — LACTIC ACID, PLASMA
Lactic Acid, Venous: 2.3 mmol/L (ref 0.5–1.9)
Lactic Acid, Venous: 2.4 mmol/L (ref 0.5–1.9)

## 2022-08-30 LAB — PREPARE RBC (CROSSMATCH)

## 2022-08-30 LAB — POTASSIUM: Potassium: 3.2 mmol/L — ABNORMAL LOW (ref 3.5–5.1)

## 2022-08-30 LAB — APTT: aPTT: 31 seconds (ref 24–36)

## 2022-08-30 SURGERY — LAPAROTOMY, EXPLORATORY
Anesthesia: General | Site: Abdomen

## 2022-08-30 MED ORDER — PHENYLEPHRINE HCL-NACL 20-0.9 MG/250ML-% IV SOLN
INTRAVENOUS | Status: DC | PRN
  Administered 2022-08-30: 60 ug/min via INTRAVENOUS
  Administered 2022-08-30 (×2): 160 ug via INTRAVENOUS

## 2022-08-30 MED ORDER — BUDESONIDE 0.5 MG/2ML IN SUSP
0.5000 mg | Freq: Two times a day (BID) | RESPIRATORY_TRACT | Status: DC
  Administered 2022-08-30 – 2022-09-06 (×15): 0.5 mg via RESPIRATORY_TRACT
  Filled 2022-08-30 (×15): qty 2

## 2022-08-30 MED ORDER — METRONIDAZOLE 500 MG/100ML IV SOLN
500.0000 mg | Freq: Two times a day (BID) | INTRAVENOUS | Status: DC
  Administered 2022-08-30 – 2022-09-01 (×4): 500 mg via INTRAVENOUS
  Filled 2022-08-30 (×4): qty 100

## 2022-08-30 MED ORDER — HYDROMORPHONE HCL 1 MG/ML IJ SOLN
0.5000 mg | INTRAMUSCULAR | Status: DC | PRN
  Administered 2022-08-30 – 2022-09-01 (×6): 0.5 mg via INTRAVENOUS
  Filled 2022-08-30 (×3): qty 1
  Filled 2022-08-30: qty 0.5
  Filled 2022-08-30 (×2): qty 1

## 2022-08-30 MED ORDER — VANCOMYCIN HCL 750 MG/150ML IV SOLN
750.0000 mg | INTRAVENOUS | Status: DC
  Administered 2022-08-31 – 2022-09-01 (×2): 750 mg via INTRAVENOUS
  Filled 2022-08-30 (×2): qty 150

## 2022-08-30 MED ORDER — SODIUM CHLORIDE 0.9 % IV BOLUS
1000.0000 mL | Freq: Once | INTRAVENOUS | Status: AC
  Administered 2022-08-30: 1000 mL via INTRAVENOUS

## 2022-08-30 MED ORDER — ORAL CARE MOUTH RINSE
15.0000 mL | OROMUCOSAL | Status: DC
  Administered 2022-08-30 – 2022-08-31 (×13): 15 mL via OROMUCOSAL

## 2022-08-30 MED ORDER — OXYCODONE HCL 5 MG PO TABS: 5.0000 mg | ORAL_TABLET | ORAL | Status: DC | PRN

## 2022-08-30 MED ORDER — CHLORHEXIDINE GLUCONATE CLOTH 2 % EX PADS
6.0000 | MEDICATED_PAD | Freq: Every day | CUTANEOUS | Status: DC
  Administered 2022-08-30 – 2022-08-31 (×3): 6 via TOPICAL

## 2022-08-30 MED ORDER — SUCCINYLCHOLINE CHLORIDE 200 MG/10ML IV SOSY
PREFILLED_SYRINGE | INTRAVENOUS | Status: DC | PRN
  Administered 2022-08-30: 100 mg via INTRAVENOUS

## 2022-08-30 MED ORDER — HYDRALAZINE HCL 20 MG/ML IJ SOLN: 5.0000 mg | INTRAMUSCULAR | Status: DC | PRN

## 2022-08-30 MED ORDER — ONDANSETRON HCL 4 MG/2ML IJ SOLN
4.0000 mg | Freq: Four times a day (QID) | INTRAMUSCULAR | Status: DC | PRN
  Administered 2022-09-06: 4 mg via INTRAVENOUS
  Filled 2022-08-30: qty 2

## 2022-08-30 MED ORDER — SODIUM CHLORIDE 0.9 % IV SOLN
2.0000 g | Freq: Two times a day (BID) | INTRAVENOUS | Status: DC
  Administered 2022-08-30 – 2022-08-31 (×4): 2 g via INTRAVENOUS
  Filled 2022-08-30 (×4): qty 12.5

## 2022-08-30 MED ORDER — 0.9 % SODIUM CHLORIDE (POUR BTL) OPTIME
TOPICAL | Status: DC | PRN
  Administered 2022-08-30: 1000 mL

## 2022-08-30 MED ORDER — LACTATED RINGERS IV SOLN: INTRAVENOUS | Status: DC | PRN

## 2022-08-30 MED ORDER — LABETALOL HCL 5 MG/ML IV SOLN
10.0000 mg | INTRAVENOUS | Status: DC | PRN
  Administered 2022-08-31: 10 mg via INTRAVENOUS
  Filled 2022-08-30: qty 4

## 2022-08-30 MED ORDER — MIDAZOLAM HCL 2 MG/2ML IJ SOLN
2.0000 mg | INTRAMUSCULAR | Status: DC | PRN
  Administered 2022-08-30: 2 mg via INTRAVENOUS
  Filled 2022-08-30: qty 2

## 2022-08-30 MED ORDER — PROPOFOL 500 MG/50ML IV EMUL
INTRAVENOUS | Status: DC | PRN
  Administered 2022-08-30: 25 ug/kg/min via INTRAVENOUS

## 2022-08-30 MED ORDER — VASOPRESSIN 20 UNIT/ML IV SOLN
INTRAVENOUS | Status: AC
  Filled 2022-08-30: qty 1

## 2022-08-30 MED ORDER — ARFORMOTEROL TARTRATE 15 MCG/2ML IN NEBU
15.0000 ug | INHALATION_SOLUTION | Freq: Two times a day (BID) | RESPIRATORY_TRACT | Status: DC
  Administered 2022-08-30 – 2022-09-06 (×15): 15 ug via RESPIRATORY_TRACT
  Filled 2022-08-30 (×15): qty 2

## 2022-08-30 MED ORDER — CLEVIDIPINE BUTYRATE 0.5 MG/ML IV EMUL
0.0000 mg/h | INTRAVENOUS | Status: DC
  Administered 2022-08-30 (×3): 2 mg/h via INTRAVENOUS
  Filled 2022-08-30: qty 50

## 2022-08-30 MED ORDER — PANTOPRAZOLE SODIUM 40 MG IV SOLR
40.0000 mg | Freq: Two times a day (BID) | INTRAVENOUS | Status: DC
  Administered 2022-08-30 – 2022-08-31 (×3): 40 mg via INTRAVENOUS
  Filled 2022-08-30 (×3): qty 10

## 2022-08-30 MED ORDER — ALBUMIN HUMAN 25 % IV SOLN
25.0000 g | Freq: Once | INTRAVENOUS | Status: AC
  Administered 2022-08-30: 25 g via INTRAVENOUS
  Filled 2022-08-30: qty 100

## 2022-08-30 MED ORDER — HYDRALAZINE HCL 20 MG/ML IJ SOLN: 10.0000 mg | INTRAMUSCULAR | Status: DC | PRN

## 2022-08-30 MED ORDER — LIDOCAINE HCL (CARDIAC) PF 100 MG/5ML IV SOSY
PREFILLED_SYRINGE | INTRAVENOUS | Status: DC | PRN
  Administered 2022-08-30: 40 mg via INTRATRACHEAL

## 2022-08-30 MED ORDER — SODIUM CHLORIDE 0.9 % IV SOLN: INTRAVENOUS | Status: DC | PRN

## 2022-08-30 MED ORDER — FENTANYL BOLUS VIA INFUSION: 25.0000 ug | INTRAVENOUS | Status: DC | PRN

## 2022-08-30 MED ORDER — ORAL CARE MOUTH RINSE: 15.0000 mL | OROMUCOSAL | Status: DC | PRN

## 2022-08-30 MED ORDER — PROPOFOL 10 MG/ML IV BOLUS
INTRAVENOUS | Status: DC | PRN
  Administered 2022-08-30: 80 mg via INTRAVENOUS

## 2022-08-30 MED ORDER — ENOXAPARIN SODIUM 40 MG/0.4ML IJ SOSY
40.0000 mg | PREFILLED_SYRINGE | INTRAMUSCULAR | Status: DC
  Administered 2022-08-31 – 2022-09-05 (×6): 40 mg via SUBCUTANEOUS
  Filled 2022-08-30 (×6): qty 0.4

## 2022-08-30 MED ORDER — ACETAMINOPHEN 10 MG/ML IV SOLN
1000.0000 mg | Freq: Once | INTRAVENOUS | Status: AC
  Administered 2022-08-30: 1000 mg via INTRAVENOUS
  Filled 2022-08-30: qty 100

## 2022-08-30 MED ORDER — FENTANYL CITRATE (PF) 250 MCG/5ML IJ SOLN
INTRAMUSCULAR | Status: AC
  Filled 2022-08-30: qty 5

## 2022-08-30 MED ORDER — ACETAMINOPHEN 500 MG PO TABS
1000.0000 mg | ORAL_TABLET | Freq: Four times a day (QID) | ORAL | Status: DC | PRN
  Administered 2022-08-31: 1000 mg via ORAL
  Filled 2022-08-30: qty 2

## 2022-08-30 MED ORDER — HYDRALAZINE HCL 20 MG/ML IJ SOLN
10.0000 mg | INTRAMUSCULAR | Status: DC | PRN
  Administered 2022-08-30: 20 mg via INTRAVENOUS
  Filled 2022-08-30: qty 1

## 2022-08-30 MED ORDER — ENOXAPARIN SODIUM 40 MG/0.4ML IJ SOSY
40.0000 mg | PREFILLED_SYRINGE | INTRAMUSCULAR | Status: DC
  Filled 2022-08-30: qty 0.4

## 2022-08-30 MED ORDER — SODIUM CHLORIDE 0.9% IV SOLUTION: Freq: Once | INTRAVENOUS | Status: AC

## 2022-08-30 MED ORDER — FENTANYL 2500MCG IN NS 250ML (10MCG/ML) PREMIX INFUSION: 25.0000 ug/h | INTRAVENOUS | Status: DC

## 2022-08-30 MED ORDER — FENTANYL CITRATE PF 50 MCG/ML IJ SOSY
25.0000 ug | PREFILLED_SYRINGE | Freq: Once | INTRAMUSCULAR | Status: AC
  Administered 2022-08-30: 25 ug via INTRAVENOUS
  Filled 2022-08-30: qty 1

## 2022-08-30 MED ORDER — ACETAMINOPHEN 650 MG RE SUPP: 650.0000 mg | RECTAL | Status: DC | PRN

## 2022-08-30 MED ORDER — IOHEXOL 350 MG/ML SOLN
75.0000 mL | Freq: Once | INTRAVENOUS | Status: AC | PRN
  Administered 2022-08-30: 75 mL via INTRAVENOUS

## 2022-08-30 MED ORDER — LABETALOL HCL 5 MG/ML IV SOLN: 20.0000 mg | INTRAVENOUS | Status: DC | PRN

## 2022-08-30 MED ORDER — ACETAMINOPHEN 500 MG PO TABS: 1000.0000 mg | ORAL_TABLET | Freq: Four times a day (QID) | ORAL | Status: DC

## 2022-08-30 MED ORDER — ROCURONIUM BROMIDE 100 MG/10ML IV SOLN
INTRAVENOUS | Status: DC | PRN
  Administered 2022-08-30: 50 mg via INTRAVENOUS
  Administered 2022-08-30: 20 mg via INTRAVENOUS
  Administered 2022-08-30: 30 mg via INTRAVENOUS
  Administered 2022-08-30: 50 mg via INTRAVENOUS

## 2022-08-30 MED ORDER — FENTANYL CITRATE PF 50 MCG/ML IJ SOSY
50.0000 ug | PREFILLED_SYRINGE | INTRAMUSCULAR | Status: DC | PRN
  Administered 2022-08-30 – 2022-08-31 (×3): 50 ug via INTRAVENOUS
  Filled 2022-08-30 (×3): qty 1

## 2022-08-30 MED ORDER — SODIUM CHLORIDE 0.9% IV SOLUTION: Freq: Once | INTRAVENOUS | Status: DC

## 2022-08-30 MED ORDER — METHOCARBAMOL 1000 MG/10ML IJ SOLN: 500.0000 mg | Freq: Four times a day (QID) | INTRAVENOUS | Status: DC | PRN

## 2022-08-30 MED ORDER — ALBUMIN HUMAN 5 % IV SOLN: INTRAVENOUS | Status: DC | PRN

## 2022-08-30 MED ORDER — FENTANYL CITRATE (PF) 250 MCG/5ML IJ SOLN
INTRAMUSCULAR | Status: DC | PRN
  Administered 2022-08-30 (×7): 50 ug via INTRAVENOUS

## 2022-08-30 MED ORDER — PROPOFOL 10 MG/ML IV BOLUS
INTRAVENOUS | Status: AC
  Filled 2022-08-30: qty 20

## 2022-08-30 MED ORDER — LACTATED RINGERS IV BOLUS
1000.0000 mL | Freq: Once | INTRAVENOUS | Status: AC
  Administered 2022-08-30: 1000 mL via INTRAVENOUS

## 2022-08-30 SURGICAL SUPPLY — 58 items
BLADE CLIPPER SURG (BLADE) IMPLANT
CANISTER SUCT 3000ML PPV (MISCELLANEOUS) ×3 IMPLANT
CHLORAPREP W/TINT 26 (MISCELLANEOUS) ×3 IMPLANT
COVER SURGICAL LIGHT HANDLE (MISCELLANEOUS) ×3 IMPLANT
DRAIN CHANNEL 19F RND (DRAIN) IMPLANT
DRAIN PENROSE 0.25X18 (DRAIN) IMPLANT
DRAPE LAPAROSCOPIC ABDOMINAL (DRAPES) ×3 IMPLANT
DRAPE WARM FLUID 44X44 (DRAPES) ×3 IMPLANT
DRSG OPSITE POSTOP 4X10 (GAUZE/BANDAGES/DRESSINGS) IMPLANT
DRSG OPSITE POSTOP 4X8 (GAUZE/BANDAGES/DRESSINGS) IMPLANT
ELECT BLADE 6.5 EXT (BLADE) IMPLANT
ELECT CAUTERY BLADE 6.4 (BLADE) ×3 IMPLANT
ELECT REM PT RETURN 9FT ADLT (ELECTROSURGICAL) ×3
ELECTRODE REM PT RTRN 9FT ADLT (ELECTROSURGICAL) ×3 IMPLANT
EVACUATOR SILICONE 100CC (DRAIN) IMPLANT
GAUZE SPONGE 4X4 12PLY STRL (GAUZE/BANDAGES/DRESSINGS) IMPLANT
GLOVE BIO SURGEON STRL SZ7.5 (GLOVE) ×3 IMPLANT
GLOVE BIOGEL PI IND STRL 8 (GLOVE) ×3 IMPLANT
GOWN STRL REUS W/ TWL LRG LVL3 (GOWN DISPOSABLE) ×3 IMPLANT
GOWN STRL REUS W/ TWL XL LVL3 (GOWN DISPOSABLE) ×3 IMPLANT
GOWN STRL REUS W/TWL LRG LVL3 (GOWN DISPOSABLE) ×3
GOWN STRL REUS W/TWL XL LVL3 (GOWN DISPOSABLE) ×3
HANDLE SUCTION POOLE (INSTRUMENTS) ×3 IMPLANT
KIT BASIN OR (CUSTOM PROCEDURE TRAY) ×3 IMPLANT
KIT TURNOVER KIT B (KITS) ×3 IMPLANT
LIGASURE IMPACT 36 18CM CVD LR (INSTRUMENTS) IMPLANT
NS IRRIG 1000ML POUR BTL (IV SOLUTION) ×6 IMPLANT
PACK GENERAL/GYN (CUSTOM PROCEDURE TRAY) ×3 IMPLANT
PAD ARMBOARD 7.5X6 YLW CONV (MISCELLANEOUS) ×3 IMPLANT
PENCIL SMOKE EVACUATOR (MISCELLANEOUS) ×3 IMPLANT
RELOAD STAPLE 60 BLK VRY/THCK (STAPLE) IMPLANT
RELOAD STAPLER 60MM BLK (STAPLE) ×9 IMPLANT
SPECIMEN JAR LARGE (MISCELLANEOUS) IMPLANT
SPONGE T-LAP 18X18 ~~LOC~~+RFID (SPONGE) IMPLANT
STAPLE ECHEON FLEX 60 POW ENDO (STAPLE) IMPLANT
STAPLER CVD CUT GN 40 RELOAD (ENDOMECHANICALS) ×3 IMPLANT
STAPLER CVD CUT GRN 40 RELOAD (ENDOMECHANICALS) IMPLANT
STAPLER PROXIMATE 75MM BLUE (STAPLE) IMPLANT
STAPLER RELOAD 60MM BLK (STAPLE) ×9
STAPLER VISISTAT 35W (STAPLE) ×3 IMPLANT
SUCTION POOLE HANDLE (INSTRUMENTS) ×3
SUT ETHILON 2 0 FS 18 (SUTURE) IMPLANT
SUT PDS AB 0 CTX 36 PDP370T (SUTURE) IMPLANT
SUT PDS AB 1 TP1 54 (SUTURE) ×6 IMPLANT
SUT SILK 2 0 SH CR/8 (SUTURE) ×3 IMPLANT
SUT SILK 2 0 TIES 10X30 (SUTURE) ×3 IMPLANT
SUT SILK 3 0 SH CR/8 (SUTURE) ×3 IMPLANT
SUT SILK 3 0 TIES 10X30 (SUTURE) ×3 IMPLANT
SUT VIC AB 2-0 SH 18 (SUTURE) IMPLANT
SUT VIC AB 2-0 SH 27 (SUTURE) ×24
SUT VIC AB 2-0 SH 27XBRD (SUTURE) IMPLANT
SUT VIC AB 3-0 SH 18 (SUTURE) IMPLANT
SUT VIC AB 3-0 SH 27 (SUTURE) ×6
SUT VIC AB 3-0 SH 27X BRD (SUTURE) IMPLANT
TAPE CLOTH SURG 4X10 WHT LF (GAUZE/BANDAGES/DRESSINGS) IMPLANT
TOWEL GREEN STERILE (TOWEL DISPOSABLE) ×3 IMPLANT
TRAY FOLEY MTR SLVR 16FR STAT (SET/KITS/TRAYS/PACK) ×3 IMPLANT
YANKAUER SUCT BULB TIP NO VENT (SUCTIONS) IMPLANT

## 2022-08-30 NOTE — ED Notes (Signed)
Surgeon at the bedside; discussing plan of care with pt and pts husband.

## 2022-08-30 NOTE — Consult Note (Signed)
Consulting Physician: Nickola Major Avalee Castrellon  Referring Provider: Dr. Addison Lank - ER Provider  Chief Complaint: Abdominal pain  Reason for Consult: Stercoral perforation   Subjective   HPI: Alyssa Boyd is an 82 y.o. female who is here for abdominal pain.  She has had severe constipation for the last 3 months.  She has been to the ER for this previously.  She developed severe lower abdominal pain and blood in her stool over the last few days and so she presented to the emergency room.  Past Medical History:  Diagnosis Date   COPD (chronic obstructive pulmonary disease) ([place])    Diabetes mellitus without complication ([place])    Hyperlipidemia    Hypertension    Parkinson disease     History reviewed. No pertinent surgical history.  History reviewed. No pertinent family history.  Social:  has no history on file for tobacco use, alcohol use, and drug use.  Allergies:  Allergies  Allergen Reactions   Shellfish Allergy Hives, Itching and Nausea Only    Medications: Current Outpatient Medications  Medication Instructions   albuterol (VENTOLIN HFA) 108 (90 Base) MCG/ACT inhaler 2 puffs, Inhalation, Every 6 hours PRN   aspirin EC 81 mg, Oral, Daily, Swallow whole.   atorvastatin (LIPITOR) 20 mg, Oral, Daily   Calcium Carbonate-Vitamin D (CALCIUM-VITAMIN D3 PO) 5 mg, Oral, Daily   carbidopa-levodopa (SINEMET IR) 25-100 MG tablet 1 tablet, Oral, 4 times daily   donepezil (ARICEPT) 5 mg, Oral, Daily at bedtime   fluticasone (FLONASE) 50 MCG/ACT nasal spray 2 sprays, Each Nare, Daily at bedtime   fluticasone-salmeterol (ADVAIR) 250-50 MCG/ACT AEPB 1 puff, Inhalation, 2 times daily   furosemide (LASIX) 20 mg, Oral, 2 times daily   glycerin adult 2 g suppository 1 suppository, Rectal, As needed   glycerin adult 2 g suppository 1 suppository, Rectal, As needed   lactulose (CEPHULAC) 20 g, Oral, 3 times daily   levalbuterol (XOPENEX) 1.25 mg, Nebulization, Every 6 hours PRN    METOPROLOL TARTRATE PO 1 tablet, Oral, 2 times daily, No mg listed on mar   montelukast (SINGULAIR) 10 mg, Oral, Daily at bedtime   Multiple Vitamins-Minerals (MULTI COMPLETE PO) 1 tablet, Oral, Daily   rOPINIRole (REQUIP) 8 mg, Oral, Daily at bedtime   telmisartan (MICARDIS) 40 mg, Oral, Daily   Tiotropium Bromide Monohydrate (SPIRIVA RESPIMAT) 2.5 MCG/ACT AERS 2 puffs, Inhalation, Daily   venlafaxine (EFFEXOR) 225 mg, Oral, Daily   Vitamin D3 50 mcg, Oral, Daily    ROS - all of the below systems have been reviewed with the patient and positives are indicated with bold text General: chills, fever or night sweats Eyes: blurry vision or double vision ENT: epistaxis or sore throat Allergy/Immunology: itchy/watery eyes or nasal congestion Hematologic/Lymphatic: bleeding problems, blood clots or swollen lymph nodes Endocrine: temperature intolerance or unexpected weight changes Breast: new or changing breast lumps or nipple discharge Resp: cough, shortness of breath, or wheezing CV: chest pain or dyspnea on exertion GI: as per HPI GU: dysuria, trouble voiding, or hematuria MSK: joint pain or joint stiffness Neuro: TIA or stroke symptoms Derm: pruritus and skin lesion changes Psych: anxiety and depression  Objective   PE Blood pressure (!) 91/45, pulse (!) 102, temperature (!) 100.4 F (38 C), temperature source Oral, resp. rate (!) 30, height 5' 5"$  (1.651 m), weight 54 kg, SpO2 97 %. Constitutional: NAD; conversant; no deformities Eyes: Moist conjunctiva; no lid lag; anicteric; PERRL Neck: Trachea midline; no thyromegaly Lungs: Normal respiratory effort;  no tactile fremitus CV: RRR; no palpable thrills; no pitting edema GI: Abd Severe lower abdominal tenderness; no palpable hepatosplenomegaly MSK: Normal range of motion of extremities; no clubbing/cyanosis Psychiatric: Appropriate affect; alert and oriented x3 Lymphatic: No palpable cervical or axillary lymphadenopathy  Results  for orders placed or performed during the hospital encounter of 08/29/22 (from the past 24 hour(s))  Lactic acid, plasma     Status: Abnormal   Collection Time: 08/29/22 10:10 PM  Result Value Ref Range   Lactic Acid, Venous 2.3 (HH) 0.5 - 1.9 mmol/L  Comprehensive metabolic panel     Status: Abnormal   Collection Time: 08/29/22 10:10 PM  Result Value Ref Range   Sodium 134 (L) 135 - 145 mmol/L   Potassium 3.7 3.5 - 5.1 mmol/L   Chloride 93 (L) 98 - 111 mmol/L   CO2 30 22 - 32 mmol/L   Glucose, Bld 170 (H) 70 - 99 mg/dL   BUN 11 8 - 23 mg/dL   Creatinine, Ser 0.92 0.44 - 1.00 mg/dL   Calcium 9.1 8.9 - 10.3 mg/dL   Total Protein 7.0 6.5 - 8.1 g/dL   Albumin 2.7 (L) 3.5 - 5.0 g/dL   AST 15 15 - 41 U/L   ALT 6 0 - 44 U/L   Alkaline Phosphatase 113 38 - 126 U/L   Total Bilirubin 0.1 (L) 0.3 - 1.2 mg/dL   GFR, Estimated >60 >60 mL/min   Anion gap 11 5 - 15  CBC with Differential     Status: Abnormal   Collection Time: 08/29/22 10:10 PM  Result Value Ref Range   WBC 12.5 (H) 4.0 - 10.5 K/uL   RBC 3.33 (L) 3.87 - 5.11 MIL/uL   Hemoglobin 10.0 (L) 12.0 - 15.0 g/dL   HCT 30.0 (L) 36.0 - 46.0 %   MCV 90.1 80.0 - 100.0 fL   MCH 30.0 26.0 - 34.0 pg   MCHC 33.3 30.0 - 36.0 g/dL   RDW 14.0 11.5 - 15.5 %   Platelets 434 (H) 150 - 400 K/uL   nRBC 0.0 0.0 - 0.2 %   Neutrophils Relative % 81 %   Neutro Abs 10.1 (H) 1.7 - 7.7 K/uL   Lymphocytes Relative 11 %   Lymphs Abs 1.3 0.7 - 4.0 K/uL   Monocytes Relative 7 %   Monocytes Absolute 0.9 0.1 - 1.0 K/uL   Eosinophils Relative 0 %   Eosinophils Absolute 0.0 0.0 - 0.5 K/uL   Basophils Relative 0 %   Basophils Absolute 0.0 0.0 - 0.1 K/uL   Immature Granulocytes 1 %   Abs Immature Granulocytes 0.09 (H) 0.00 - 0.07 K/uL  Protime-INR     Status: None   Collection Time: 08/29/22 10:10 PM  Result Value Ref Range   Prothrombin Time 14.3 11.4 - 15.2 seconds   INR 1.1 0.8 - 1.2  APTT     Status: None   Collection Time: 08/29/22 10:10 PM   Result Value Ref Range   aPTT 31 24 - 36 seconds  Lipase, blood     Status: None   Collection Time: 08/29/22 10:10 PM  Result Value Ref Range   Lipase 31 11 - 51 U/L  ABO/Rh     Status: None   Collection Time: 08/29/22 10:10 PM  Result Value Ref Range   ABO/RH(D)      O POS Performed at [facility], [address], [place], [place] [address]   Type and screen [facility]     Status: None (Preliminary result)   Collection Time: 08/29/22 10:15 PM  Result Value Ref Range   ABO/RH(D) O POS    Antibody Screen NEG    Sample Expiration 09/01/2022,2359    Unit Number [id number]    Blood Component Type RED CELLS,LR    Unit division 00    Status of Unit ISSUED    Transfusion Status OK TO TRANSFUSE    Crossmatch Result      Compatible Performed at [facility], [place] [address], [place], [place] [address]    Unit Number [id number]    Blood Component Type RED CELLS,LR    Unit division 00    Status of Unit ALLOCATED    Transfusion Status OK TO TRANSFUSE    Crossmatch Result Compatible   Lactic acid, plasma     Status: Abnormal   Collection Time: 08/30/22 12:29 AM  Result Value Ref Range   Lactic Acid, Venous 2.4 (HH) 0.5 - 1.9 mmol/L  POC occult blood, ED     Status: Abnormal   Collection Time: 08/30/22 12:30 AM  Result Value Ref Range   Fecal Occult Bld POSITIVE (A) NEGATIVE  Resp panel by RT-PCR (RSV, Flu A&B, Covid) Anterior Nasal Swab     Status: None   Collection Time: 08/30/22 12:30 AM   Specimen: Anterior Nasal Swab  Result Value Ref Range   SARS Coronavirus 2 by RT PCR NEGATIVE NEGATIVE   Influenza A by PCR NEGATIVE NEGATIVE   Influenza B by PCR NEGATIVE NEGATIVE   Resp Syncytial Virus by PCR NEGATIVE NEGATIVE  I-stat chem 8, ED (not at [facility], [facility] or [facility])     Status: Abnormal   Collection Time: 08/30/22 12:39 AM  Result Value Ref Range   Sodium 130 (L) 135 - 145 mmol/L   Potassium 6.6 (HH) 3.5 - 5.1 mmol/L   Chloride 94 (L) 98 - 111  mmol/L   BUN 15 8 - 23 mg/dL   Creatinine, Ser 0.90 0.44 - 1.00 mg/dL   Glucose, Bld 159 (H) 70 - 99 mg/dL   Calcium, Ion 1.10 (L) 1.15 - 1.40 mmol/L   TCO2 32 22 - 32 mmol/L   Hemoglobin 10.9 (L) 12.0 - 15.0 g/dL   HCT 32.0 (L) 36.0 - 46.0 %   Comment NOTIFIED PHYSICIAN   Prepare RBC (crossmatch)     Status: None   Collection Time: 08/30/22  2:30 AM  Result Value Ref Range   Order Confirmation      ORDER PROCESSED BY BLOOD BANK Performed at [facility], [address], [place], [place] [address]      Imaging Orders         DG Chest Port 1 View         CT ABDOMEN PELVIS W CONTRAST     1. Stool distended distal colon with diffuse rectal wall edema, consistent with stercoral colitis. 2. Multiple areas of hyperdensity within the rectal lumen, concerning for hemorrhage. 3. Anterior rectal perforation with potential fistulous communication with the upper vagina. 4. Bilateral hydroureteronephrosis, likely due to mass effect on the distal ureters by the large rectal stool ball.  Assessment and Plan   Leidi Zarra is an 82 y.o. female with Parkinsons and chronic constipation who presents with severe abdominal pain and blood in her stool concerning for a stercoral perforation of the rectum.  I recommended exploratory laparotomy with ostomy creation and possible bowel resection.  We discussed the surgery, its risks, benefits  and alternatives and the patient granted consent to proceed.  She is being admitted to the critical care service.  Her most recent potassium was elevated, however it was normal on arrival.  She will need resuscitation prior to proceeding to the operating room.  She will need her potassium rechecked and corrected if truly elevated prior to surgery.  We will proceed to surgery emergently.      ICD-10-CM   1. Lower abdominal pain  R10.30     2. Sepsis, due to unspecified organism, unspecified whether acute organ dysfunction present ([place])  A41.9        Felicie Morn, MD  [facility] Use [email or web address] to contact on call provider  New Patient Billing: [phone] - High MDM

## 2022-08-30 NOTE — Transfer of Care (Addendum)
Immediate Anesthesia Transfer of Care Note  Patient: Alyssa Boyd  Procedure(s) Performed: EXPLORATORY LAPAROTOMY COLON RESECTION COLOSTOMY (Abdomen) BLADDER REPAIR (Abdomen) VAGINAL REPAIR  Patient Location: 4N 28  Anesthesia Type:General  Level of Consciousness: awake, alert , and oriented  Airway & Oxygen Therapy: Patient Spontanous Breathing and Patient connected to face mask oxygen  Post-op Assessment: Report given to RN and Post -op Vital signs reviewed and stable  Post vital signs: Reviewed and stable  Last Vitals:  Vitals Value Taken Time  BP 128/50 08/30/22 0926  Temp    Pulse 81 08/30/22 0942  Resp 16 08/30/22 0942  SpO2 100 % 08/30/22 0942  Vitals shown include unvalidated device data.  Last Pain:  Vitals:   08/30/22 0313  TempSrc: Oral  PainSc:          Complications: No notable events documented.

## 2022-08-30 NOTE — ED Notes (Signed)
Pts family informed this RN that she pulled out both Ivs.

## 2022-08-30 NOTE — ED Notes (Signed)
Pt transported to CT ?

## 2022-08-30 NOTE — Sepsis Progress Note (Signed)
Notified bedside nurse of need to order repeat lactic acid.  

## 2022-08-30 NOTE — Progress Notes (Signed)
Pharmacy Antibiotic Note  Alyssa Boyd is a 82 y.o. female admitted on 08/29/2022 with severe constipation for 3 months and developed a perforation and GI bleed.  Pt is s/p surgical repair of perforation.  Per discussion with MD, Pharmacy to dose vanc, cefepime and flagyl for now for intra-abdominal coverage.  WBC are elevated 12.5 > 15.4, Scr 0.7 stable, lactic acid elevated 2.4, Tmax 100.4. Urine and blood cultures and MRSA PCR is pending.   Plan: Vancomycin 1 g already given in ED, 750 Q 24 (eAUC 437, goal 400-550) Cefepime 2 g IV Q12H based on renal function Metronidazole 500 mg IV Q12H  Monitor renal function, clinical status, vanc levels if on >4 days F/U Cx, de-escalation and LOT  Height: 5' 5"$  (165.1 cm) Weight: 54 kg (119 lb 0.8 oz) IBW/kg (Calculated) : 57  Temp (24hrs), Avg:101 F (38.3 C), Min:99.9 F (37.7 C), Max:102.8 F (39.3 C)  Recent Labs  Lab 08/29/22 2210 08/30/22 0029 08/30/22 0039 08/30/22 0224 08/30/22 0322  WBC 12.5*  --   --  15.4*  --   CREATININE 0.92  --  0.90  --  0.70  LATICACIDVEN 2.3* 2.4*  --   --   --     Estimated Creatinine Clearance: 47.8 mL/min (by C-G formula based on SCr of 0.7 mg/dL).    Allergies  Allergen Reactions   Shellfish Allergy Hives, Itching and Nausea Only    Antimicrobials this admission: Vanc 1 g x 1, metronidazole 500 mg x 1, cefepime 2 g x 1 in ED Vanc 2/21 >>  Cefepime 2/21 >>  Metronidazole 2/21 >>  Microbiology results: 2/20 BCx: pending 2/20 UCx: pending  2/21 MRSA PCR: pending  Thank you for allowing pharmacy to be a part of this patient's care.  Rodolph Bong, PharmD Candidate 08/30/2022 10:25 AM

## 2022-08-30 NOTE — H&P (Signed)
NAME:  Alyssa Boyd, MRN:  [id number], DOB:  03/25/1941, LOS: 0 ADMISSION DATE:  08/29/2022, CONSULTATION DATE:  08/30/22 REFERRING MD:  Stechschulte CHIEF COMPLAINT:  Abd pain   History of Present Illness:  Alyssa Boyd is a 82 y.o. female who has a PMH as below and who resides at [facility]. She presented to [facility] ED 2/20 with rectal bleeding and abdominal pai.  She had abd CT that revealed stercoral colitis with evidence of bleeding and anterior rectal perforation with possible fistulous communication with he upper vagina along with bilateral hydroureteronephrosis.  She was evaluated by general surgery and was taken to the OR for emergent open left colectomy with low anterior resection and creation of distal transverse colostomy, bladder injury repair, rectovaginal fistula takedown with vaginal repair.  Post operatively, she returned to the ICU on the ventilator and PCCM asked to admit.  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  Significant Hospital Events: Including procedures, antibiotic start and stop dates in addition to other pertinent events   2/21 > admit, taken to OR for emergent surgical repair of rectal perforation and rectovaginal fistula.  Interim History / Subjective:  Sedated on 25 Propofol. Propofol just turned off. Will attempt SBT if mental status allows.  Objective:  Blood pressure (!) 99/48, pulse 95, temperature (!) 100.4 F (38 C), resp. rate (!) 28, height 5' 5"$  (1.651 m), weight 54 kg, SpO2 96 %.        Intake/Output Summary (Last 24 hours) at 08/30/2022 0924 Last data filed at 08/30/2022 0905 Gross  per 24 hour  Intake 3592.99 ml  Output 1500 ml  Net 2092.99 ml   Filed Weights   08/29/22 2202  Weight: 54 kg    Examination: General: Elderly female, resting in bed, in NAD. Neuro: Sedated, not responsive. HEENT: [place]/AT. Sclerae anicteric. ETT in place. Cardiovascular: RRR, no M/R/G.  Lungs: Respirations even and unlabored.  CTA bilaterally, No W/R/R. Abdomen: Ostomy bags x 2. BS hypoactive. Abd soft. Musculoskeletal: No gross deformities, no edema.  Skin: Intact, warm, no rashes.  Labs/imaging personally reviewed:  CT A/P 2/20 > stercoral colitis with evidence of bleeding and anterior rectal perforation with possible fistulous communication with he upper vagina along with bilateral hydroureteronephrosis.   Stercoral perforation with rectovaginal fistula - s/p emergent surgical repair 2/21 (open left colectomy with low anterior resection and creation of distal transverse colostomy, bladder injury repair, rectovaginal fistula takedown with vaginal repair). - Post op care per general surgery, appreciate the assistance. - Continue Vanc/Cefepime/Flagyl for now. - Follow cultures.  Post operative ventilator management. Hx COPD. - Stop Propofol now to allow for WUA/SBT. - Extubate if able (will hold off on placing vent orders, if she does not wake up will place those). - Budesonide/Brovana in lieu of home Advair. - Albuterol PRN.  Hyperkalemia. Hypocalcemia. - Repeat STAT labs now and in AM.  Anemia with hx of rectal bleeding PTA. Transfuse for Hgb < 7.  Hx HTN, HLD. - Hold home ASA, Atorvastatin, Furosemide, Lopressor, Telmisartan.  Hx DM. - SSI if glucose consistently > 180.  Hx Parkinsons. - Hold home Buspirone, Sinemet, Donepezil, Ropinirole, Venlafaxine.  Best practice (evaluated daily):  Diet/type: NPO DVT prophylaxis: prophylactic heparin  GI prophylaxis: PPI Lines: Arterial  Line Foley:  Yes, and it is still needed Code Status:  full code Last date of  multidisciplinary goals of care discussion: None yet.  Labs   CBC: Recent Labs  Lab 08/29/22 2210 08/30/22 0039 08/30/22 0224 08/30/22 0322  WBC 12.5*  --  15.4*  --   NEUTROABS 10.1*  --   --   --   HGB 10.0* 10.9* 8.6* 8.2*  HCT 30.0* 32.0* 24.0* 24.0*  MCV 90.1  --  92.3  --   PLT 434*  --  330  --     Basic Metabolic Panel: Recent Labs  Lab 08/29/22 2210 08/30/22 0039 08/30/22 0307 08/30/22 0322  NA 134* 130*  --  130*  K 3.7 6.6* 3.2* 5.6*  CL 93* 94*  --  97*  CO2 30  --   --   --   GLUCOSE 170* 159*  --  177*  BUN 11 15  --  12  CREATININE 0.92 0.90  --  0.70  CALCIUM 9.1  --   --   --    GFR: Estimated Creatinine Clearance: 47.8 mL/min (by C-G formula based on SCr of 0.7 mg/dL). Recent Labs  Lab 08/29/22 2210 08/30/22 0029 08/30/22 0224  WBC 12.5*  --  15.4*  LATICACIDVEN 2.3* 2.4*  --     Liver Function Tests: Recent Labs  Lab 08/29/22 2210  AST 15  ALT 6  ALKPHOS 113  BILITOT 0.1*  PROT 7.0  ALBUMIN 2.7*   Recent Labs  Lab 08/29/22 2210  LIPASE 31   No results for input(s): "AMMONIA" in the last 168 hours.  ABG    Component Value Date/Time   HCO3 31.7 (H) 09/20/2021 1516   TCO2 29 08/30/2022 0322   O2SAT 78 09/20/2021 1516     Coagulation Profile: Recent Labs  Lab 08/29/22 2210  INR 1.1    Cardiac Enzymes: No results for input(s): "CKTOTAL", "CKMB", "CKMBINDEX", "TROPONINI" in the last 168 hours.  HbA1C: No results found for: "HGBA1C"  CBG: No results for input(s): "GLUCAP" in the last 168 hours.  Review of Systems:   Unable to obtain as pt is encephalopathic.  Past Medical History:  She,  has a past medical history of COPD (chronic obstructive pulmonary disease) ([place]), Diabetes mellitus without complication ([place]), Hyperlipidemia, Hypertension, and Parkinson disease.   Surgical History:  History reviewed. No pertinent surgical history.   Social History:      Family History:  Her family history is not on  file.   Allergies Allergies  Allergen Reactions   Shellfish Allergy Hives, Itching and Nausea Only     Home Medications  Prior to Admission medications   Medication Sig Start Date End Date Taking? Authorizing Provider  albuterol (VENTOLIN HFA) 108 (90 Base) MCG/ACT inhaler Inhale 2 puffs into the lungs every 6 (six) hours as needed for wheezing or shortness of breath.   Yes [provider]  aspirin EC 81 MG tablet Take 81 mg by mouth daily. Swallow whole.   Yes [provider]  atorvastatin (LIPITOR) 20 MG tablet Take 20 mg by mouth daily.   Yes [provider]  busPIRone (BUSPAR) 5 MG tablet Take 5 mg by mouth 2 (two) times daily.   Yes [provider]  Calcium Carbonate-Vitamin D (CALCIUM-VITAMIN D3 PO) Take 5 mg by mouth daily.   Yes [provider]  carbidopa-levodopa (SINEMET IR) 25-100 MG tablet Take 1 tablet by mouth 4 (four) times daily.   Yes [provider]  Cholecalciferol (VITAMIN D3) 50 MCG (2000 UT) CHEW Chew 50 mcg by mouth daily.   Yes [provider]  donepezil (ARICEPT) 5 MG tablet Take 5 mg by mouth at bedtime.   Yes [provider]  fluticasone (FLONASE) 50 MCG/ACT nasal spray Place 2 sprays into both nostrils at bedtime.   Yes [provider]  fluticasone-salmeterol (ADVAIR) 250-50 MCG/ACT AEPB Inhale 1 puff into the lungs in the morning and at bedtime.   Yes [provider]  furosemide (LASIX) 20 MG tablet Take 20 mg by mouth 2 (two) times daily.   Yes [provider]  glycerin adult 2 g suppository Place 1 suppository rectally as needed for constipation. 06/22/22  Yes Rex Kras, PA  lactulose (CEPHULAC) 20 g packet Take 1 packet (20 g total) by mouth 3 (three) times daily. 06/22/22  Yes Rex Kras, PA  loratadine (CLARITIN) 10 MG tablet Take 10 mg by mouth daily.   Yes [provider]  metoprolol tartrate (LOPRESSOR) 50 MG tablet Take 50 mg by mouth 2 (two) times  daily. No mg listed on mar   Yes [provider]  montelukast (SINGULAIR) 10 MG tablet Take 10 mg by mouth at bedtime.   Yes [provider]  Multiple Vitamins-Minerals (MULTI COMPLETE PO) Take 1 tablet by mouth daily.   Yes [provider]  psyllium (REGULOID) 0.52 g capsule Take 0.52 g by mouth at bedtime.   Yes [provider]  rOPINIRole (REQUIP) 4 MG tablet Take 8 mg by mouth at bedtime.   Yes [provider]  senna (SENOKOT) 8.6 MG TABS tablet Take 1 tablet by mouth every Monday, Wednesday, and Friday.   Yes [provider]  telmisartan (MICARDIS) 40 MG tablet Take 40 mg by mouth daily.   Yes [provider]  Tiotropium Bromide Monohydrate (SPIRIVA RESPIMAT) 2.5 MCG/ACT AERS Inhale 2 puffs into the lungs daily.   Yes [provider]  venlafaxine (EFFEXOR) 75 MG tablet Take 225 mg by mouth daily.   Yes [provider]  levalbuterol (XOPENEX) 1.25 MG/3ML nebulizer solution Take 1.25 mg by nebulization every 6 (six) hours as needed for wheezing or shortness of breath. Patient not taking: Reported on 08/30/2022    [provider]     Critical care time: 35 minutes   Montey Hora, [place] [facility] For pager details, please see AMION or use Epic chat  After 1900, please call [facility] for cross coverage needs 08/30/2022, 9:24 AM

## 2022-08-30 NOTE — Anesthesia Procedure Notes (Signed)
Procedure Name: Intubation Date/Time: 08/30/2022 5:31 AM  Performed by: Clovis Cao, CRNAPre-anesthesia Checklist: Patient identified, Emergency Drugs available, Suction available and Patient being monitored Patient Re-evaluated:Patient Re-evaluated prior to induction Oxygen Delivery Method: Circle system utilized Preoxygenation: Pre-oxygenation with 100% oxygen Induction Type: IV induction, Rapid sequence and Cricoid Pressure applied Laryngoscope Size: Glidescope and 3 Grade View: Grade I Tube type: Oral Tube size: 7.5 mm Number of attempts: 1 Airway Equipment and Method: Stylet and Video-laryngoscopy Placement Confirmation: ETT inserted through vocal cords under direct vision, positive ETCO2 and breath sounds checked- equal and bilateral Secured at: 21 cm Tube secured with: Tape Dental Injury: Teeth and Oropharynx as per pre-operative assessment

## 2022-08-30 NOTE — Anesthesia Procedure Notes (Signed)
Arterial Line Insertion Start/End2/21/2024 5:11 AM, 08/30/2022 5:14 AM Performed by: Effie Berkshire, MD  Patient location: Pre-op. Preanesthetic checklist: patient identified, IV checked, site marked, risks and benefits discussed, surgical consent, monitors and equipment checked, pre-op evaluation, timeout performed and anesthesia consent Lidocaine 1% used for infiltration Left, radial was placed Catheter size: 20 G Hand hygiene performed  and maximum sterile barriers used   Attempts: 1 Procedure performed without using ultrasound guided technique. Following insertion, dressing applied and Biopatch. Post procedure assessment: normal and unchanged  Patient tolerated the procedure well with no immediate complications.

## 2022-08-30 NOTE — ED Provider Notes (Signed)
I assumed care of this patient.  Please see previous provider note for further details of Hx, PE.  Briefly patient is a 82 y.o. female who presented with lower abdominal pain and rectal bleeding.  Noted to be febrile.  Sepsis workup was initiated and patient was started on empiric antibiotics.  Currently awaiting labs and imaging.  CBC with leukocytosis and anemia with a hemoglobin of 10.0, down from 11.2 a couple of months ago. CMP without significant electrolyte derangements.  Mild hyperglycemia without evidence of DKA. Initial lactic acid of 2.3. Chest x-ray without evidence of pneumonia. CT of the abdomen notable for rectal wall edema with large amount of stool in the rectum concerning for stercoral colitis.  There is evidence of perforation and active bleeding.  Consult to gen. Surgery for evaluation.  Dr. Thermon Leyland will evaluate the patient for likely operative management.    Patient's BP began to trend down. Additional IVF ordered. Emergent pRBCs transfusion started. Consulted CC for admission.  .Critical Care  Performed by: Fatima Blank, MD Authorized by: Fatima Blank, MD   Critical care provider statement:    Critical care time (minutes):  60   Critical care time was exclusive of:  Separately billable procedures and treating other patients   Critical care was necessary to treat or prevent imminent or life-threatening deterioration of the following conditions:  Sepsis and shock   Critical care was time spent personally by me on the following activities:  Development of treatment plan with patient or surrogate, discussions with consultants, evaluation of patient's response to treatment, examination of patient, obtaining history from patient or surrogate, review of old charts, re-evaluation of patient's condition, pulse oximetry, ordering and review of radiographic studies, ordering and review of laboratory studies and ordering and performing treatments and  interventions   Care discussed with: admitting provider         Fatima Blank, MD 08/30/22 [phone]

## 2022-08-30 NOTE — ED Notes (Signed)
Kristi, RN placed 2 new Ivs in the pts arm. Antibiotic and fluids resumed.

## 2022-08-30 NOTE — Consult Note (Signed)
[facility] Nurse ostomy consult note Open left colectomy w/low anterior resection and creation of end distal transverse colostomy 2/21  Stoma type/location: LMQ transverse colostomy  Stomal assessment/size: 1 3/4", red moist budded  Peristomal assessment: not able to assess  Treatment options for stomal/peristomal skin:  Output none  Ostomy pouching: 2 piece 2 3/4" in place at this time  Education provided: Patient is entubated and non-responsive at this time.  No family in room.  Educational booklet left in room and ostomy supplies ordered for room.  WOC will follow patient for ostomy education and support.   Enrolled patient in [place] program: No    Thank you,    Kayd Launer MSN, RN-BC, [employer]

## 2022-08-30 NOTE — Op Note (Signed)
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  Surgical Procedure:  OPEN LEFT COLECTOMY WITH LOW ANTERIOR RESECTION AND CREATION OF END DISTAL TRANSVERSE COLOSTOMY BLADDER INJURY REPAIR RECTOVAGINAL FISTULA TAKEDOWN WITH VAGINAL REPAIR  Operative Team Members:  Surgeon(s) and Role:    * Coraleigh Sheeran, Nickola Major, MD - Primary    * Clovis Riley, MD - Assisting   Anesthesiologist: Effie Berkshire, MD; Myrtie Soman, MD CRNA: Alain Marion, CRNA; Clovis Cao, CRNA; Elvin So, CRNA   Anesthesia: General   Fluids:  Total I/O In: 300 [I.V.:300] Out: 200 [id number]  Complications: None  Drains:   Penrose drain in the subcutaneous layer of the midline incision   19 Fr JP drain entering the right lower abdomen draining the pelvis near the rectal stump, vaginal repair and bladder repair    Specimen:  ID Type Source Tests Collected by Time Destination  1 : LEFT COLON GI Left/Descending Colon SURGICAL PATHOLOGY Diandra Cimini, Nickola Major, MD 08/30/2022 [phone]      Disposition:  ICU  Plan of Care:  Continue inpatient care    Indications for Procedure: Alyssa Boyd is a 82 y.o. 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  Presented with signs of sepsis or abdominal pain so I recommended emergent exploratory laparotomy with possible bowel resection and creation of an ostomy.  The procedure itself as well as its risks, benefits and alternatives were discussed.  The risks discussed included but were not limited to the risk of infection, bleeding, damage to nearby structures, and prolonged hospitalization and/or ICU stay.  After a full discussion and all questions answered the patient granted consent to proceed.  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  Massive stool burden within the descending colon and rectum.  Tortuous elongated colon.   Description of Procedure:   On the date stated above the patient was taken the operating room suite and placed in supine position.  General endotracheal anesthesia induced.  A timeout was completed verifying the correct patient, procedure, positioning, and equipment needed for the case.  The patient's abdomen was prepped and draped in usual sterile fashion.  I made a midline laparotomy incision and entered the abdomen.  There was no trauma to the underlying viscera with initial entry into the abdomen.  I placed a Bookwalter retractor and worked to mobilize the colon.  Immediately I could feel a massive stool burden in the rectum.  I approached the rectum first by mobilizing the left colon.  The white line of Toldt was divided and I lifted the descending colon out of the retroperitoneum.  There were adhesions between the tortuous elongated sigmoid colon and the omentum which were divided in the sigmoid colon and left colon were able to be mobilized out of the retroperitoneum.  I mobilized the splenic flexure by the disconnecting the omentum from the transverse colon and dividing the splenocolic ligaments.  There was firm stool throughout the descending colon.  I divided the distal transverse colon in an area where the stool burden was less severe and there was good blood flow.  I then divided the mesocolon working distally using the LigaSure device.  There is no free intraperitoneal perforation of the rectum, however I was worried about the patient developing worsening sepsis and ongoing rectal bleeding  if I did not address the frontal extraperitoneal perforation.  I decided perform a low anterior resection.  The rectum was mobilized circumferentially.  I divided the blood flow bilaterally and worked to mobilize the rectum posteriorly and working in the presacral space.   Anteriorly the rectum was densely adherent to the vagina and bladder.  Using a combination of blunt and sharp dissection as well as electrocautery I mobilized the rectum off of the bladder and the vagina.  A hole was created in the bladder during this part of the dissection.  This was repaired in 2 layers using Vicryl suture.  A rectovaginal fistula was encountered and a hole was created in the vagina.  This was repaired using running Vicryl suture.  The rectum was able to be mobilized below the area of inflammation.  I attempted to divide the rectum with the stapler however it was so large that the stapler was available would not work.  This stool burden was milked out of the rectum up into the specimen as best I could and then the rectum was divided using scissors.  We then passed the left colon specimen off the field.  The open end of the rectum was irrigated and stool was washed clean.  The rectum was palpated and felt to have probably 6 or 7 cm down to the anal sphincters.  The rectum was then sewn closed with multiple interrupted Vicryl suture.  A 19 [place] JP drain was brought in through the right abdomen placed in the pelvis to drain the area around these repairs.  The Foley catheter was placed to begin the case will remain in place postoperatively.  The abdomen was irrigated with multiple liters of saline irrigation.  A defect was created in the left upper quadrant for creation of the end of distal transverse colostomy.  I made a circular incision in the skin, a cruciate incision in the anterior rectus fascia, the rectus muscle fibers were split along their length, and the posterior rectus sheath and peritoneum were divided.  The stapled end of the transverse colon was then delivered through this defect.  The midline fascia was closed using running 0 PDS suture.  A Penrose drain was placed in the subcutaneous space.  The skin was closed with skin staples.  We then matured the ostomy.  The staple line was  removed using scissors.  3-0 Vicryl sutures were used to place Denhoff style sutures, 1 in each quadrant, and then multiple sutures were used between the used to reapproximate the mucocutaneous junction.  Dressings were applied.  An ostomy appliance was applied.  The patient was woken from anesthesia and transferred to the ICU.  All sponge needle counts were correct at the end of this case.  At the end of the case we reviewed the infection status of the case. Patient: Alyssa Boyd Emergency General Surgery Service Patient Case: Emergent Infection Present At Time Of Surgery (PATOS):  Perforated rectum with rectovaginal fistula and contamination of the abdomen  Louanna Raw, MD General, Bariatric, & Minimally Invasive Surgery [facility], [place]

## 2022-08-30 NOTE — Anesthesia Preprocedure Evaluation (Signed)
Anesthesia Evaluation  Patient identified by MRN, date of birth, ID band Patient awake    Reviewed: Allergy & Precautions, NPO status , Patient's Chart, lab work & pertinent test results, reviewed documented beta blocker date and time Preop documentation limited or incomplete due to emergent nature of procedure.  Airway Mallampati: I  TM Distance: >3 FB Neck ROM: Full    Dental  (+) Edentulous Upper, Edentulous Lower   Pulmonary COPD,  COPD inhaler    + decreased breath sounds      Cardiovascular hypertension, Pt. on medications and Pt. on home beta blockers  Rhythm:Irregular Rate:Normal     Neuro/Psych    GI/Hepatic negative GI ROS, Neg liver ROS,,,  Endo/Other  diabetes    Renal/GU negative Renal ROS     Musculoskeletal negative musculoskeletal ROS (+)    Abdominal   Peds  Hematology negative hematology ROS (+)   Anesthesia Other Findings   Reproductive/Obstetrics                             Anesthesia Physical Anesthesia Plan  ASA: 3 and emergent  Anesthesia Plan: General   Post-op Pain Management: Ofirmev IV (intra-op)*   Induction: Intravenous  PONV Risk Score and Plan: 4 or greater and Ondansetron, Dexamethasone and Treatment may vary due to age or medical condition  Airway Management Planned: Oral ETT  Additional Equipment: None  Intra-op Plan:   Post-operative Plan: Extubation in OR  Informed Consent: I have reviewed the patients History and Physical, chart, labs and discussed the procedure including the risks, benefits and alternatives for the proposed anesthesia with the patient or authorized representative who has indicated his/her understanding and acceptance.       Plan Discussed with: CRNA  Anesthesia Plan Comments:        Anesthesia Quick Evaluation

## 2022-08-30 NOTE — Anesthesia Postprocedure Evaluation (Signed)
Anesthesia Post Note  Patient: Alyssa Boyd  Procedure(s) Performed: EXPLORATORY LAPAROTOMY COLON RESECTION COLOSTOMY (Abdomen) BLADDER REPAIR (Abdomen) VAGINAL REPAIR     Patient location during evaluation: SICU Anesthesia Type: General Level of consciousness: sedated Pain management: pain level controlled Vital Signs Assessment: post-procedure vital signs reviewed and stable Respiratory status: patient remains intubated per anesthesia plan Cardiovascular status: stable Postop Assessment: no apparent nausea or vomiting Anesthetic complications: no  No notable events documented.  Last Vitals:  Vitals:   08/30/22 1054 08/30/22 1100  BP:  (!) 165/68  Pulse:  84  Resp:  17  Temp:    SpO2: 99% 100%    Last Pain:  Vitals:   08/30/22 0313  TempSrc: Oral  PainSc:                  Ardyn Forge S

## 2022-08-30 NOTE — ED Notes (Signed)
This Publishing copy NT performed peri care on the pt. Pts brief had a very large amount of stool in addition to bright red blood with clots. While changing the pt, pt soiled through 3 chuck pads with blood and clots that is actively coming out of her rectum. Provider Cardoma made aware.

## 2022-08-30 NOTE — Progress Notes (Signed)
eLink Physician-Brief Progress Note Patient Name: Alyssa Boyd DOB: 08-27-1940 MRN: [id number]   Date of Service  08/30/2022  HPI/Events of Note  K+ 3.3 from earlier today, not repleted.  eICU Interventions  BMP check ordered.        Frederik Pear 08/30/2022, 10:17 PM

## 2022-08-31 ENCOUNTER — Encounter (HOSPITAL_COMMUNITY): Payer: Self-pay | Admitting: Surgery

## 2022-08-31 DIAGNOSIS — K631 Perforation of intestine (nontraumatic): Secondary | ICD-10-CM | POA: Diagnosis not present

## 2022-08-31 LAB — BASIC METABOLIC PANEL
Anion gap: 6 (ref 5–15)
BUN: <5 mg/dL — ABNORMAL LOW (ref 8–23)
CO2: 24 mmol/L (ref 22–32)
Calcium: 8.2 mg/dL — ABNORMAL LOW (ref 8.9–10.3)
Chloride: 108 mmol/L (ref 98–111)
Creatinine, Ser: 0.65 mg/dL (ref 0.44–1.00)
GFR, Estimated: >60 mL/min (ref >60)
Glucose, Bld: 168 mg/dL — ABNORMAL HIGH (ref 70–99)
Potassium: 3.9 mmol/L (ref 3.5–5.1)
Sodium: 138 mmol/L (ref 135–145)

## 2022-08-31 LAB — POCT I-STAT 7, (LYTES, BLD GAS, ICA,H+H)
Acid-Base Excess: 0 mmol/L (ref 0.0–2.0)
Acid-base deficit: 1 mmol/L (ref 0.0–2.0)
Acid-base deficit: 1 mmol/L (ref 0.0–2.0)
Bicarbonate: 24 mmol/L (ref 20.0–28.0)
Bicarbonate: 21.8 mmol/L (ref 20.0–28.0)
Bicarbonate: 24.5 mmol/L (ref 20.0–28.0)
Calcium, Ion: 1.16 mmol/L (ref 1.15–1.40)
Calcium, Ion: 1.12 mmol/L — ABNORMAL LOW (ref 1.15–1.40)
Calcium, Ion: 1.2 mmol/L (ref 1.15–1.40)
HCT: 24 % — ABNORMAL LOW (ref 36.0–46.0)
HCT: 27 % — ABNORMAL LOW (ref 36.0–46.0)
HCT: 29 % — ABNORMAL LOW (ref 36.0–46.0)
Hemoglobin: 8.2 g/dL — ABNORMAL LOW (ref 12.0–15.0)
Hemoglobin: 9.2 g/dL — ABNORMAL LOW (ref 12.0–15.0)
Hemoglobin: 9.9 g/dL — ABNORMAL LOW (ref 12.0–15.0)
O2 Saturation: 100 %
O2 Saturation: 100 %
O2 Saturation: 100 %
Patient temperature: 36.8
Patient temperature: 36.8
Potassium: 3.5 mmol/L (ref 3.5–5.1)
Potassium: 3.3 mmol/L — ABNORMAL LOW (ref 3.5–5.1)
Potassium: 3.4 mmol/L — ABNORMAL LOW (ref 3.5–5.1)
Sodium: 136 mmol/L (ref 135–145)
Sodium: 137 mmol/L (ref 135–145)
Sodium: 138 mmol/L (ref 135–145)
TCO2: 25 mmol/L (ref 22–32)
TCO2: 23 mmol/L (ref 22–32)
TCO2: 26 mmol/L (ref 22–32)
pCO2 arterial: 34.2 mmHg (ref 32–48)
pCO2 arterial: 29.7 mmHg — ABNORMAL LOW (ref 32–48)
pCO2 arterial: 44.6 mmHg (ref 32–48)
pH, Arterial: 7.453 — ABNORMAL HIGH (ref 7.35–7.45)
pH, Arterial: 7.473 — ABNORMAL HIGH (ref 7.35–7.45)
pH, Arterial: 7.348 — ABNORMAL LOW (ref 7.35–7.45)
pO2, Arterial: 337 mmHg — ABNORMAL HIGH (ref 83–108)
pO2, Arterial: 267 mmHg — ABNORMAL HIGH (ref 83–108)
pO2, Arterial: 236 mmHg — ABNORMAL HIGH (ref 83–108)

## 2022-08-31 LAB — GLUCOSE, CAPILLARY
Glucose-Capillary: 174 mg/dL — ABNORMAL HIGH (ref 70–99)
Glucose-Capillary: 178 mg/dL — ABNORMAL HIGH (ref 70–99)
Glucose-Capillary: 99 mg/dL (ref 70–99)
Glucose-Capillary: 132 mg/dL — ABNORMAL HIGH (ref 70–99)

## 2022-08-31 LAB — CBC
HCT: 26.8 % — ABNORMAL LOW (ref 36.0–46.0)
Hemoglobin: 9.3 g/dL — ABNORMAL LOW (ref 12.0–15.0)
MCH: 31.1 pg (ref 26.0–34.0)
MCHC: 34.7 g/dL (ref 30.0–36.0)
MCV: 89.6 fL (ref 80.0–100.0)
Platelets: 227 10*3/uL (ref 150–400)
RBC: 2.99 MIL/uL — ABNORMAL LOW (ref 3.87–5.11)
RDW: 15 % (ref 11.5–15.5)
WBC: 13.5 10*3/uL — ABNORMAL HIGH (ref 4.0–10.5)
nRBC: 0 % (ref 0.0–0.2)

## 2022-08-31 LAB — HEMOGLOBIN A1C
Hgb A1c MFr Bld: 5.8 % — ABNORMAL HIGH (ref 4.8–5.6)
Mean Plasma Glucose: 119.76 mg/dL

## 2022-08-31 LAB — BPAM RBC: Blood Product Expiration Date: 202403222359

## 2022-08-31 LAB — URINE CULTURE: Culture: NO GROWTH

## 2022-08-31 LAB — PHOSPHORUS: Phosphorus: 2 mg/dL — ABNORMAL LOW (ref 2.5–4.6)

## 2022-08-31 LAB — LACTIC ACID, PLASMA: Lactic Acid, Venous: 0.9 mmol/L (ref 0.5–1.9)

## 2022-08-31 LAB — MAGNESIUM: Magnesium: 1.6 mg/dL — ABNORMAL LOW (ref 1.7–2.4)

## 2022-08-31 MED ORDER — MAGNESIUM SULFATE 2 GM/50ML IV SOLN
2.0000 g | Freq: Once | INTRAVENOUS | Status: AC
  Administered 2022-08-31: 2 g via INTRAVENOUS
  Filled 2022-08-31: qty 50

## 2022-08-31 MED ORDER — CARBIDOPA-LEVODOPA 25-100 MG PO TABS
1.0000 | ORAL_TABLET | Freq: Four times a day (QID) | ORAL | Status: DC
  Administered 2022-08-31 – 2022-09-06 (×23): 1 via ORAL
  Filled 2022-08-31 (×24): qty 1

## 2022-08-31 MED ORDER — INSULIN ASPART 100 UNIT/ML IJ SOLN
0.0000 [IU] | INTRAMUSCULAR | Status: DC
  Administered 2022-08-31: 1 [IU] via SUBCUTANEOUS
  Administered 2022-08-31 (×2): 2 [IU] via SUBCUTANEOUS
  Administered 2022-09-01 – 2022-09-03 (×5): 1 [IU] via SUBCUTANEOUS
  Administered 2022-09-03: 3 [IU] via SUBCUTANEOUS
  Administered 2022-09-03: 1 [IU] via SUBCUTANEOUS
  Administered 2022-09-03: 2 [IU] via SUBCUTANEOUS
  Administered 2022-09-04: 3 [IU] via SUBCUTANEOUS
  Administered 2022-09-04: 2 [IU] via SUBCUTANEOUS
  Administered 2022-09-04 (×2): 1 [IU] via SUBCUTANEOUS
  Administered 2022-09-04: 3 [IU] via SUBCUTANEOUS
  Administered 2022-09-05: 1 [IU] via SUBCUTANEOUS
  Administered 2022-09-05 (×2): 2 [IU] via SUBCUTANEOUS
  Administered 2022-09-05: 1 [IU] via SUBCUTANEOUS
  Administered 2022-09-05: 2 [IU] via SUBCUTANEOUS
  Administered 2022-09-05: 1 [IU] via SUBCUTANEOUS
  Administered 2022-09-06: 2 [IU] via SUBCUTANEOUS

## 2022-08-31 MED ORDER — ACETAMINOPHEN 10 MG/ML IV SOLN
1000.0000 mg | Freq: Three times a day (TID) | INTRAVENOUS | Status: DC | PRN
  Administered 2022-08-31: 1000 mg via INTRAVENOUS
  Filled 2022-08-31: qty 100

## 2022-08-31 MED ORDER — POTASSIUM PHOSPHATES 15 MMOLE/5ML IV SOLN
30.0000 mmol | Freq: Once | INTRAVENOUS | Status: AC
  Administered 2022-08-31: 30 mmol via INTRAVENOUS
  Filled 2022-08-31: qty 10

## 2022-08-31 MED ORDER — DONEPEZIL HCL 10 MG PO TABS
5.0000 mg | ORAL_TABLET | Freq: Every day | ORAL | Status: DC
  Administered 2022-08-31 – 2022-09-05 (×6): 5 mg via ORAL
  Filled 2022-08-31 (×6): qty 1

## 2022-08-31 MED ORDER — POTASSIUM CHLORIDE 10 MEQ/100ML IV SOLN
10.0000 meq | INTRAVENOUS | Status: AC
  Administered 2022-08-31 (×6): 10 meq via INTRAVENOUS
  Filled 2022-08-31 (×6): qty 100

## 2022-08-31 MED ORDER — CHLORHEXIDINE GLUCONATE CLOTH 2 % EX PADS
6.0000 | MEDICATED_PAD | Freq: Every day | CUTANEOUS | Status: DC
  Administered 2022-09-02 – 2022-09-06 (×5): 6 via TOPICAL

## 2022-08-31 MED ORDER — ORAL CARE MOUTH RINSE: 15.0000 mL | OROMUCOSAL | Status: DC | PRN

## 2022-08-31 NOTE — Procedures (Signed)
Extubation Procedure Note  Patient Details:   Name: Alyssa Boyd DOB: July 26, 1940 MRN: [id number]   Airway Documentation:    Vent end date: 08/31/22 Vent end time: 0949   Evaluation  O2 sats: stable throughout Complications: No apparent complications Patient did tolerate procedure well. Bilateral Breath Sounds: Clear, Diminished   Yes  Pt extubated per MD order. Placed on 3L [place] at this time. Positive cuff leak noted, no stridor heard. RT will continue to monitor.  Eugene Gavia 08/31/2022, 9:49 AM

## 2022-08-31 NOTE — Progress Notes (Signed)
NAME:  Alyssa Boyd, MRN:  [id number], DOB:  03/31/1941, LOS: 1 ADMISSION DATE:  08/29/2022, CONSULTATION DATE:  08/30/22 REFERRING MD:  Stechschulte CHIEF COMPLAINT:  Abd pain   History of Present Illness:  Alyssa Boyd is a 82 y.o. female who has a PMH as below and who resides at [facility]. She presented to [facility] ED 2/20 with rectal bleeding and abdominal pai.  She had abd CT that revealed stercoral colitis with evidence of bleeding and anterior rectal perforation with possible fistulous communication with he upper vagina along with bilateral hydroureteronephrosis.  She was evaluated by general surgery and was taken to the OR for emergent open left colectomy with low anterior resection and creation of distal transverse colostomy, bladder injury repair, rectovaginal fistula takedown with vaginal repair.  Post operatively, she returned to the ICU on the ventilator and PCCM asked to admit.  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  Significant Hospital Events: Including procedures, antibiotic start and stop dates in addition to other pertinent events   2/21 > admit, taken to OR for emergent surgical repair of rectal perforation and rectovaginal fistula.  Interim History / Subjective:  Stayed off continuous sedation, plan for extubation this morning  Objective:  Blood pressure (!) 132/56, pulse (!) 124, temperature (!) 100.6 F (38.1 C), temperature source Axillary, resp. rate 16, height 5' 5"$  (1.651 m), weight 59.6 kg, SpO2 99 %.    Vent Mode: PSV;CPAP FiO2 (%):  [40 %] 40 % Set Rate:  [16 bmp] 16 bmp Vt Set:  [450 mL] 450  mL PEEP:  [5 cmH20] 5 cmH20 Pressure Support:  [5 cmH20-8 cmH20] 8 cmH20   Intake/Output Summary (Last 24 hours) at 08/31/2022 1050 Last data filed at 08/31/2022 1000 Gross per 24 hour  Intake 3275.53 ml  Output 2080 ml  Net 1195.53 ml    Filed Weights   08/29/22 2202 08/31/22 0300  Weight: 54 kg 59.6 kg    Examination: General: Elderly female, resting in bed, in NAD. Neuro: Follows commands, interactive HEENT: /AT. Sclerae anicteric. ETT in place. Cardiovascular: RRR, no M/R/G.  Lungs: Respirations even and unlabored.  CTA bilaterally, No W/R/R. Abdomen: Ostomy bags JP drain clean dry and intact Musculoskeletal: No gross deformities, no edema.  Skin: Intact, warm, no rashes.  Labs/imaging personally reviewed:  CT A/P 2/20 > stercoral colitis with evidence of bleeding and anterior rectal perforation with possible fistulous communication with he upper vagina along with bilateral hydroureteronephrosis. Assessment and plan:  Stercoral perforation with rectovaginal fistula - s/p emergent surgical repair 2/21 (open left colectomy with low anterior resection and creation of distal transverse colostomy, bladder injury repair, rectovaginal fistula takedown with vaginal repair). - Post op care per general surgery, appreciate the assistance. - Continue Vanc/Cefepime/Flagyl for now. - Follow cultures. -Trial clear liquid diet if able to tolerate p.o. following extubation  Post operative ventilator management. Hx COPD. -Passed SBT, extubation orders this morning - Budesonide/Brovana in lieu of home Advair. - Albuterol PRN.  Hyperkalemia. Hypocalcemia. -Improved postop peripherally  Anemia with hx of rectal bleeding PTA. Transfuse for Hgb < 7. -- Hemoglobin trend stable  Hx HTN, HLD. - Hold home ASA, Atorvastatin, Furosemide, Lopressor, Telmisartan, blood pressure is increasing may need to resume later in the  day versus tomorrow pending blood pressure trend after  extubation  Hx DM. - SSI if glucose consistently > 180.  Hx Parkinsons. - Hold home Buspirone, Sinemet, Donepezil, Ropinirole, Venlafaxine.  Best practice (evaluated daily):  Diet/type: NPO DVT prophylaxis: prophylactic heparin  GI prophylaxis: PPI Lines: Arterial Line Foley:  Yes, and it is still needed Code Status:  full code Last date of multidisciplinary goals of care discussion: None yet.  Labs   CBC: Recent Labs  Lab 08/29/22 2210 08/30/22 0039 08/30/22 0224 08/30/22 0322 08/30/22 1729 08/31/22 0530  WBC 12.5*  --  15.4*  --  13.5* 13.5*  NEUTROABS 10.1*  --   --   --   --   --   HGB 10.0* 10.9* 8.6* 8.2* 10.2* 9.3*  HCT 30.0* 32.0* 24.0* 24.0* 29.6* 26.8*  MCV 90.1  --  92.3  --  87.8 89.6  PLT 434*  --  330  --  253 227     Basic Metabolic Panel: Recent Labs  Lab 08/29/22 2210 08/30/22 0039 08/30/22 0307 08/30/22 0322 08/30/22 1126 08/30/22 2235 08/31/22 0530  NA 134* 130*  --  130* 137 138 138  K 3.7 6.6* 3.2* 5.6* 3.3* 3.0* 3.9  CL 93* 94*  --  97* 108 106 108  CO2 30  --   --   --  22 24 24  $ GLUCOSE 170* 159*  --  177* 233* 232* 168*  BUN 11 15  --  12 6* 5* <5*  CREATININE 0.92 0.90  --  0.70 0.72 0.65 0.65  CALCIUM 9.1  --   --   --  7.8* 8.3* 8.2*  MG  --   --   --   --   --   --  1.6*  PHOS  --   --   --   --   --   --  2.0*    GFR: Estimated Creatinine Clearance: 50.5 mL/min (by C-G formula based on SCr of 0.65 mg/dL). Recent Labs  Lab 08/29/22 2210 08/30/22 0029 08/30/22 0224 08/30/22 1729 08/31/22 0530  WBC 12.5*  --  15.4* 13.5* 13.5*  LATICACIDVEN 2.3* 2.4*  --   --  0.9     Liver Function Tests: Recent Labs  Lab 08/29/22 2210  AST 15  ALT 6  ALKPHOS 113  BILITOT 0.1*  PROT 7.0  ALBUMIN 2.7*    Recent Labs  Lab 08/29/22 2210  LIPASE 31    No results for input(s): "AMMONIA" in the last 168 hours.  ABG    Component Value Date/Time   HCO3 31.7 (H) 09/20/2021 1516   TCO2 29 08/30/2022 0322   O2SAT 78  09/20/2021 1516     Coagulation Profile: Recent Labs  Lab 08/29/22 2210  INR 1.1     Cardiac Enzymes: No results for input(s): "CKTOTAL", "CKMB", "CKMBINDEX", "TROPONINI" in the last 168 hours.  HbA1C: No results found for: "HGBA1C"  CBG: No results for input(s): "GLUCAP" in the last 168 hours.  Review of Systems:   Unable to obtain as pt is encephalopathic.  Past Medical History:  She,  has a past medical history of COPD (chronic obstructive pulmonary disease) ([place]), Diabetes mellitus without complication ([place]), Hyperlipidemia, Hypertension, and Parkinson disease.   Surgical History:   Past Surgical History:  Procedure Laterality Date   BLADDER REPAIR N/A 08/30/2022   Procedure: BLADDER REPAIR;  Surgeon: Felicie Morn, MD;  Location: [place];  Service: General;  Laterality: N/A;   COLOSTOMY N/A  08/30/2022   Procedure: COLOSTOMY;  Surgeon: Stechschulte, Nickola Major, MD;  Location: [place];  Service: General;  Laterality: N/A;   COLOSTOMY REVISION N/A 08/30/2022   Procedure: COLON RESECTION;  Surgeon: Felicie Morn, MD;  Location: [place];  Service: General;  Laterality: N/A;   LAPAROTOMY N/A 08/30/2022   Procedure: EXPLORATORY LAPAROTOMY;  Surgeon: Felicie Morn, MD;  Location: [place];  Service: General;  Laterality: N/A;   REPAIR VAGINAL CUFF  08/30/2022   Procedure: VAGINAL REPAIR;  Surgeon: Felicie Morn, MD;  Location: [place];  Service: General;;     Social History:      Family History:  Her family history is not on file.   Allergies Allergies  Allergen Reactions   Shellfish Allergy Hives, Itching and Nausea Only     Home Medications  Prior to Admission medications   Medication Sig Start Date End Date Taking? Authorizing Provider  albuterol (VENTOLIN HFA) 108 (90 Base) MCG/ACT inhaler Inhale 2 puffs into the lungs every 6 (six) hours as needed for wheezing or shortness of breath.   Yes [provider]  aspirin EC 81 MG tablet Take 81 mg  by mouth daily. Swallow whole.   Yes [provider]  atorvastatin (LIPITOR) 20 MG tablet Take 20 mg by mouth daily.   Yes [provider]  busPIRone (BUSPAR) 5 MG tablet Take 5 mg by mouth 2 (two) times daily.   Yes [provider]  Calcium Carbonate-Vitamin D (CALCIUM-VITAMIN D3 PO) Take 5 mg by mouth daily.   Yes [provider]  carbidopa-levodopa (SINEMET IR) 25-100 MG tablet Take 1 tablet by mouth 4 (four) times daily.   Yes [provider]  Cholecalciferol (VITAMIN D3) 50 MCG (2000 UT) CHEW Chew 50 mcg by mouth daily.   Yes [provider]  donepezil (ARICEPT) 5 MG tablet Take 5 mg by mouth at bedtime.   Yes [provider]  fluticasone (FLONASE) 50 MCG/ACT nasal spray Place 2 sprays into both nostrils at bedtime.   Yes [provider]  fluticasone-salmeterol (ADVAIR) 250-50 MCG/ACT AEPB Inhale 1 puff into the lungs in the morning and at bedtime.   Yes [provider]  furosemide (LASIX) 20 MG tablet Take 20 mg by mouth 2 (two) times daily.   Yes [provider]  glycerin adult 2 g suppository Place 1 suppository rectally as needed for constipation. 06/22/22  Yes Rex Kras, PA  lactulose (CEPHULAC) 20 g packet Take 1 packet (20 g total) by mouth 3 (three) times daily. 06/22/22  Yes Rex Kras, PA  loratadine (CLARITIN) 10 MG tablet Take 10 mg by mouth daily.   Yes [provider]  metoprolol tartrate (LOPRESSOR) 50 MG tablet Take 50 mg by mouth 2 (two) times daily. No mg listed on mar   Yes [provider]  montelukast (SINGULAIR) 10 MG tablet Take 10 mg by mouth at bedtime.   Yes [provider]  Multiple Vitamins-Minerals (MULTI COMPLETE PO) Take 1 tablet by mouth daily.   Yes [provider]  psyllium (REGULOID) 0.52 g capsule Take 0.52 g by mouth at bedtime.   Yes [provider]  rOPINIRole (REQUIP) 4 MG tablet Take 8 mg by mouth at bedtime.   Yes [provider]  senna (SENOKOT) 8.6 MG TABS tablet Take 1 tablet by mouth every Monday, Wednesday, and Friday.   Yes [provider]  telmisartan (MICARDIS) 40 MG tablet Take 40 mg by mouth daily.  Yes [provider]  Tiotropium Bromide Monohydrate (SPIRIVA RESPIMAT) 2.5 MCG/ACT AERS Inhale 2 puffs into the lungs daily.   Yes [provider]  venlafaxine (EFFEXOR) 75 MG tablet Take 225 mg by mouth daily.   Yes [provider]  levalbuterol (XOPENEX) 1.25 MG/3ML nebulizer solution Take 1.25 mg by nebulization every 6 (six) hours as needed for wheezing or shortness of breath. Patient not taking: Reported on 08/30/2022    [provider]     Critical care time:    CRITICAL CARE Performed by: Lanier Clam   Total critical care time: 35 minutes  Critical care time was exclusive of separately billable procedures and treating other patients.  Critical care was necessary to treat or prevent imminent or life-threatening deterioration.  Critical care was time spent personally by me on the following activities: development of treatment plan with patient and/or surrogate as well as nursing, discussions with consultants, evaluation of patient's response to treatment, examination of patient, obtaining history from patient or surrogate, ordering and performing treatments and interventions, ordering and review of laboratory studies, ordering and review of radiographic studies, pulse oximetry and re-evaluation of patient's condition.  Lanier Clam, MD [facility] For pager details, please see AMION or use Epic chat  After 1900, please call [facility] for cross coverage needs 08/31/2022, 10:50 AM

## 2022-08-31 NOTE — Progress Notes (Addendum)
Progress Note  1 Day Post-Op  Subjective: Alert, intubated. Following commands. Denies significant pain   Objective: Vital signs in last 24 hours: Temp:  [97.6 F (36.4 C)-100.7 F (38.2 C)] 100.7 F (38.2 C) (02/22 0400) Pulse Rate:  [77-162] 125 (02/22 0700) Resp:  [7-22] 15 (02/22 0700) BP: (107-165)/(43-79) 142/63 (02/22 0700) SpO2:  [95 %-100 %] 98 % (02/22 0700) Arterial Line BP: (98-188)/(32-71) 98/71 (02/22 0700) FiO2 (%):  [40 %] 40 % (02/22 0350) Weight:  [59.6 kg] 59.6 kg (02/22 0300)    Intake/Output from previous day: 02/21 0701 - 02/22 0700 In: 3793.9 [I.V.:1189.1; IV Piggyback:2604.8] Out: 2090 [Urine:1625; Drains:465] Intake/Output this shift: No intake/output data recorded.  PE: General: WD, female who is laying in bed in NAD, intubated Lungs: no respiratory distress on ventilator Abd: soft, moderately distended, appropriate TTP, midline with staples intact and small amount of SS drain with penrose from distal wound. Ostomy with stoma pink and viable, air and SS fluid in bag Skin: warm and dry GU: foley draining clear yellow urine   Lab Results:  Recent Labs    08/30/22 1729 08/31/22 0530  WBC 13.5* 13.5*  HGB 10.2* 9.3*  HCT 29.6* 26.8*  PLT 253 227   BMET Recent Labs    08/30/22 1126 08/30/22 2235  NA 137 138  K 3.3* 3.0*  CL 108 106  CO2 22 24  GLUCOSE 233* 232*  BUN 6* 5*  CREATININE 0.72 0.65  CALCIUM 7.8* 8.3*   PT/INR Recent Labs    08/29/22 2210  LABPROT 14.3  INR 1.1   CMP     Component Value Date/Time   NA 138 08/30/2022 2235   K 3.0 (L) 08/30/2022 2235   CL 106 08/30/2022 2235   CO2 24 08/30/2022 2235   GLUCOSE 232 (H) 08/30/2022 2235   BUN 5 (L) 08/30/2022 2235   CREATININE 0.65 08/30/2022 2235   CALCIUM 8.3 (L) 08/30/2022 2235   PROT 7.0 08/29/2022 2210   ALBUMIN 2.7 (L) 08/29/2022 2210   AST 15 08/29/2022 2210   ALT 6 08/29/2022 2210   ALKPHOS 113 08/29/2022 2210   BILITOT 0.1 (L) 08/29/2022 2210    GFRNONAA >60 08/30/2022 2235   Lipase     Component Value Date/Time   LIPASE 31 08/29/2022 2210       Studies/Results: CT ABDOMEN PELVIS W CONTRAST  Result Date: 08/30/2022 CLINICAL DATA:  Acute abdominal pain EXAM: CT ABDOMEN AND PELVIS WITH CONTRAST TECHNIQUE: Multidetector CT imaging of the abdomen and pelvis was performed using the standard protocol following bolus administration of intravenous contrast. RADIATION DOSE REDUCTION: This exam was performed according to the departmental dose-optimization program which includes automated exposure control, adjustment of the mA and/or kV according to patient size and/or use of iterative reconstruction technique. CONTRAST:  57m OMNIPAQUE IOHEXOL 350 MG/ML SOLN COMPARISON:  06/22/2022 FINDINGS: Lower chest: No acute abnormality. Hepatobiliary: No focal liver abnormality is seen. Status post cholecystectomy. No biliary dilatation. Pancreas: Unremarkable. No pancreatic ductal dilatation or surrounding inflammatory changes. Spleen: Normal in size without focal abnormality. Adrenals/Urinary Tract: The adrenal glands are normal. There is bilateral hydroureteronephrosis. Nonobstructing 6 mm stone in the lower right renal pelvis. Stomach/Bowel: There is an enormous amount of stool within the rectum. There is diffuse rectal wall edema with multiple areas of hyperdensity within the rectal lumen, concerning for hemorrhage. Additionally, there is an area of discontinuity along the anterior rectal wall (series 7, image 73). This is likely perforation with possible fistulous communication with  the upper vaginal vault. There is possible pneumatosis coli of the anterior rectal wall (series 3, image 42). Vascular/Lymphatic: Aortic atherosclerosis. No enlarged abdominal or pelvic lymph nodes. Reproductive: Possible fistulous communication between the vagina and rectum. Other: None Musculoskeletal: No acute or significant osseous findings. IMPRESSION: 1. Stool distended  distal colon with diffuse rectal wall edema, consistent with stercoral colitis. 2. Multiple areas of hyperdensity within the rectal lumen, concerning for hemorrhage. 3. Anterior rectal perforation with potential fistulous communication with the upper vagina. 4. Bilateral hydroureteronephrosis, likely due to mass effect on the distal ureters by the large rectal stool ball. Aortic Atherosclerosis (ICD10-I70.0). These results were called by telephone at the time of interpretation on 08/30/2022 at 2:02 am to provider [facility] , who verbally acknowledged these results. Electronically Signed   By: Ulyses Jarred M.D.   On: 08/30/2022 02:03   DG Chest Port 1 View  Result Date: 08/29/2022 CLINICAL DATA:  Sepsis, rectal bleeding EXAM: PORTABLE CHEST 1 VIEW COMPARISON:  09/20/2021 FINDINGS: Single frontal view of the chest demonstrates an unremarkable cardiac silhouette. Ectasia of the thoracic aorta. No acute airspace disease, effusion, or pneumothorax. No acute bony abnormalities. IMPRESSION: 1. No acute intrathoracic process. Electronically Signed   By: Randa Ngo M.D.   On: 08/29/2022 23:00    Anti-infectives: Anti-infectives (From admission, onward)    Start     Dose/Rate Route Frequency Ordered Stop   08/31/22 0030  vancomycin (VANCOREADY) IVPB 750 mg/150 mL        750 mg 150 mL/hr over 60 Minutes Intravenous Every 24 hours 08/30/22 1102     08/30/22 1500  metroNIDAZOLE (FLAGYL) IVPB 500 mg        500 mg 100 mL/hr over 60 Minutes Intravenous Every 12 hours 08/30/22 1013     08/30/22 1200  ceFEPIme (MAXIPIME) 2 g in sodium chloride 0.9 % 100 mL IVPB        2 g 200 mL/hr over 30 Minutes Intravenous Every 12 hours 08/30/22 1102     08/29/22 2245  ceFEPIme (MAXIPIME) 2 g in sodium chloride 0.9 % 100 mL IVPB        2 g 200 mL/hr over 30 Minutes Intravenous  Once 08/29/22 2244 08/30/22 0017   08/29/22 2245  metroNIDAZOLE (FLAGYL) IVPB 500 mg        500 mg 100 mL/hr over 60 Minutes Intravenous   Once 08/29/22 2244 08/30/22 0407   08/29/22 2245  vancomycin (VANCOCIN) IVPB 1000 mg/200 mL premix        1,000 mg 200 mL/hr over 60 Minutes Intravenous  Once 08/29/22 2244 08/30/22 0231        Assessment/Plan Stercoral perforation of the abdomen POD1 s/p 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 Dr. Lanny Hurst 2/21 - operative findings of stercoral perforation of the rectum with fistula to the vagina - appreciate CCM care - okay for sips of clears once extubated and monitor bowel function, +flatus today - continue foley at least 14 days s/p bladder repair - JP SS, continue  FEN: NPO - okay for sips of clears once extubated ID: cefepime, flagyl, vanc VTE: lovenox  I reviewed last 24 h vitals and pain scores, last 48 h intake and output, last 24 h labs and trends, and last 24 h imaging results.    LOS: 1 day   [facility] 08/31/2022, 7:47 AM Please see Amion for pager number during day hours  7:00am-4:30pm

## 2022-08-31 NOTE — Progress Notes (Addendum)
eLink Physician-Brief Progress Note Patient Name: Alyssa Boyd DOB: [birth year] MRN: [id number]   Date of Service  08/31/2022  HPI/Events of Note  82 year old female postop day 1 status post left open colectomy with transverse colostomy passing gas and minimal liquid stool.  Extubated earlier today.  Passed swallow.  Per surgical team, okay to start clear liquid diet.  Family concerned specifically about parkinsons meds  sinemet IR 25-'100mg'$  4times a day aripcept '5mg'$  @ hs  eICU Interventions  Per surgical team, advance to clear liquid diet  Resume Sinemet and Aricept.  Numerous other home meds held for the time being including antihypertensives until they can be addressed by the primary team.   0018 - called d/t BP;  MAp < 65. 104/47, 96/53  comes up with stimulation. Can continue to monitor for now  Intervention Category Minor Interventions: Routine modifications to care plan (e.g. PRN medications for pain, fever)  Ilyas Lipsitz 08/31/2022, 8:03 PM

## 2022-08-31 NOTE — Progress Notes (Signed)
eLink Physician-Brief Progress Note Patient Name: Abygayle Wuebben DOB: 11-May-1941 MRN: [id number]   Date of Service  08/31/2022  HPI/Events of Note  Patient reaching for ET tube, she has a temperature of 100.7 and cannot get enteral or rectal Tylenol.  eICU Interventions  Bilateral soft wrist restraints ordered, PRN iv Tylenol ordered.        Frederik Pear 08/31/2022, 5:33 AM

## 2022-08-31 NOTE — Progress Notes (Signed)
Pt noted to be attempting to dislodge her intubation tubing. Elink informed and non-violent restraints requested. Pt also w/ low grade temp 100.46F, this too communicated to [place].

## 2022-08-31 NOTE — Progress Notes (Addendum)
Initial Nutrition Assessment  DOCUMENTATION CODES:   Not applicable  INTERVENTION:  - Add Boost Breeze po TID, each supplement provides 250 kcal and 9 grams of protein   - Add MVI q day.   NUTRITION DIAGNOSIS:   Inadequate oral intake related to altered GI function as evidenced by other (comment) (CL diet).  GOAL:   Patient will meet greater than or equal to 90% of their needs  MONITOR:   PO intake, Diet advancement  REASON FOR ASSESSMENT:   Other (Comment) (RD screen)    ASSESSMENT:   82 y.o. female admits related to rectal bleeding and abdominal pain. PMH includes: AMS, acute metabolic encephalopathy, Parkinson's Disease, HTN, T2DM, aneurysm of vertebral artery, foot fracture, hypokalemia, hypomagnesemia, COPD, HLD. Pt is currently receiving medical management  related to stercoral perforation with rectovaginal fistula s/p  emergement surgical repair on 2/21 (open left colectomy with low anterior resection and creation of distal transverse colostomy, bladder injury repair, rectovaginal fistula takedown with vaginal repair).  Meds reviewed: sliding scale insulin. Labs reviewed: BUN low, Phos low, Mag low.   The pt is currently on CL diet related to recent surgery. Pt now with colostomy in place. Pt reports that she was eating well PTA. She denies any wt loss recently. Pt unable to meet her needs on CL diet. Pt agreed to try Boost Breeze TID. RD will continue to monitor PO intakes.   NUTRITION - FOCUSED PHYSICAL EXAM:  Flowsheet Row Most Recent Value  Orbital Region Mild depletion  Upper Arm Region Mild depletion  Thoracic and Lumbar Region No depletion  Buccal Region Mild depletion  Temple Region Moderate depletion  Clavicle Bone Region Moderate depletion  Clavicle and Acromion Bone Region Moderate depletion  Dorsal Hand Unable to assess  Patellar Region Mild depletion  Anterior Thigh Region Mild depletion  Posterior Calf Region Mild depletion  Edema (RD Assessment)  Moderate  Hair Reviewed  Eyes Reviewed  Mouth Reviewed  Skin Reviewed  Nails Reviewed       Diet Order:   Diet Order             Diet clear liquid Room service appropriate? Yes with Assist; Fluid consistency: Thin  Diet effective now                   EDUCATION NEEDS:   Not appropriate for education at this time  Skin:  Skin Assessment: Skin Integrity Issues: Skin Integrity Issues:: Incisions Incisions: abdomen  Last BM:  colostomy to LUQ  Height:   Ht Readings from Last 1 Encounters:  08/29/22 '5\' 5"'$  (1.651 m)    Weight:   Wt Readings from Last 1 Encounters:  09/01/22 60.1 kg    Ideal Body Weight:     BMI:  Body mass index is 22.05 kg/m.  Estimated Nutritional Needs:   Kcal:  1800-2100 kcals  Protein:  90-105 gm  Fluid:  >/= 1.8 L  Thalia Bloodgood, RD, LDN, CNSC.

## 2022-08-31 NOTE — Progress Notes (Signed)
[place] Progress Note Patient Name: Alyssa Boyd DOB: [birth year] MRN: [id number]   Date of Service  08/31/2022  HPI/Events of Note  K+ 3.0.  eICU Interventions  K+ replaced per electrolyte replacement protocol.        [name] 08/31/2022, 12:11 AM

## 2022-09-01 DIAGNOSIS — R1030 Lower abdominal pain, unspecified: Secondary | ICD-10-CM | POA: Diagnosis not present

## 2022-09-01 DIAGNOSIS — K631 Perforation of intestine (nontraumatic): Secondary | ICD-10-CM | POA: Diagnosis not present

## 2022-09-01 LAB — CBC
HCT: 25.9 % — ABNORMAL LOW (ref 36.0–46.0)
Hemoglobin: 9.3 g/dL — ABNORMAL LOW (ref 12.0–15.0)
MCH: 32.5 pg (ref 26.0–34.0)
MCHC: 35.9 g/dL (ref 30.0–36.0)
MCV: 90.6 fL (ref 80.0–100.0)
Platelets: 280 10*3/uL (ref 150–400)
RBC: 2.86 MIL/uL — ABNORMAL LOW (ref 3.87–5.11)
RDW: 14.4 % (ref 11.5–15.5)
WBC: 12 10*3/uL — ABNORMAL HIGH (ref 4.0–10.5)
nRBC: 0 % (ref 0.0–0.2)

## 2022-09-01 LAB — BASIC METABOLIC PANEL
Anion gap: 8 (ref 5–15)
BUN: 5 mg/dL — ABNORMAL LOW (ref 8–23)
CO2: 24 mmol/L (ref 22–32)
Calcium: 8 mg/dL — ABNORMAL LOW (ref 8.9–10.3)
Chloride: 104 mmol/L (ref 98–111)
Creatinine, Ser: 0.59 mg/dL (ref 0.44–1.00)
GFR, Estimated: >60 mL/min (ref >60)
Glucose, Bld: 132 mg/dL — ABNORMAL HIGH (ref 70–99)
Potassium: 3.7 mmol/L (ref 3.5–5.1)
Sodium: 136 mmol/L (ref 135–145)

## 2022-09-01 LAB — GLUCOSE, CAPILLARY
Glucose-Capillary: 99 mg/dL (ref 70–99)
Glucose-Capillary: 106 mg/dL — ABNORMAL HIGH (ref 70–99)
Glucose-Capillary: 131 mg/dL — ABNORMAL HIGH (ref 70–99)
Glucose-Capillary: 107 mg/dL — ABNORMAL HIGH (ref 70–99)
Glucose-Capillary: 124 mg/dL — ABNORMAL HIGH (ref 70–99)

## 2022-09-01 LAB — MAGNESIUM: Magnesium: 2.1 mg/dL (ref 1.7–2.4)

## 2022-09-01 LAB — PHOSPHORUS: Phosphorus: 2.2 mg/dL — ABNORMAL LOW (ref 2.5–4.6)

## 2022-09-01 MED ORDER — PIPERACILLIN-TAZOBACTAM 3.375 G IVPB
3.3750 g | Freq: Three times a day (TID) | INTRAVENOUS | Status: DC
  Administered 2022-09-01 – 2022-09-04 (×9): 3.375 g via INTRAVENOUS
  Filled 2022-09-01 (×9): qty 50

## 2022-09-01 MED ORDER — DOCUSATE SODIUM 100 MG PO CAPS
100.0000 mg | ORAL_CAPSULE | Freq: Two times a day (BID) | ORAL | Status: DC
  Administered 2022-09-01 – 2022-09-06 (×11): 100 mg via ORAL
  Filled 2022-09-01 (×11): qty 1

## 2022-09-01 MED ORDER — SENNA 8.6 MG PO TABS
1.0000 | ORAL_TABLET | Freq: Every day | ORAL | Status: DC
  Administered 2022-09-01 – 2022-09-06 (×6): 8.6 mg via ORAL
  Filled 2022-09-01 (×6): qty 1

## 2022-09-01 MED ORDER — METOPROLOL TARTRATE 50 MG PO TABS
50.0000 mg | ORAL_TABLET | Freq: Two times a day (BID) | ORAL | Status: DC
  Administered 2022-09-01 – 2022-09-06 (×11): 50 mg via ORAL
  Filled 2022-09-01 (×11): qty 1

## 2022-09-01 MED ORDER — POLYETHYLENE GLYCOL 3350 17 G PO PACK
17.0000 g | PACK | Freq: Two times a day (BID) | ORAL | Status: DC
  Administered 2022-09-01 – 2022-09-06 (×11): 17 g via ORAL
  Filled 2022-09-01 (×11): qty 1

## 2022-09-01 MED ORDER — ADULT MULTIVITAMIN W/MINERALS CH
1.0000 | ORAL_TABLET | Freq: Every day | ORAL | Status: DC
  Administered 2022-09-01 – 2022-09-06 (×6): 1 via ORAL
  Filled 2022-09-01 (×6): qty 1

## 2022-09-01 MED ORDER — ACETAMINOPHEN 500 MG PO TABS
1000.0000 mg | ORAL_TABLET | Freq: Three times a day (TID) | ORAL | Status: DC
  Administered 2022-09-01 – 2022-09-06 (×16): 1000 mg via ORAL
  Filled 2022-09-01 (×17): qty 2

## 2022-09-01 MED ORDER — BOOST / RESOURCE BREEZE PO LIQD CUSTOM
1.0000 | Freq: Three times a day (TID) | ORAL | Status: DC
  Administered 2022-09-01 – 2022-09-05 (×11): 1 via ORAL

## 2022-09-01 MED ORDER — TRAMADOL HCL 50 MG PO TABS
50.0000 mg | ORAL_TABLET | Freq: Four times a day (QID) | ORAL | Status: DC | PRN
  Administered 2022-09-01 – 2022-09-06 (×7): 50 mg via ORAL
  Filled 2022-09-01 (×7): qty 1

## 2022-09-01 MED ORDER — BUSPIRONE HCL 5 MG PO TABS
5.0000 mg | ORAL_TABLET | Freq: Two times a day (BID) | ORAL | Status: DC
  Administered 2022-09-01 – 2022-09-06 (×11): 5 mg via ORAL
  Filled 2022-09-01 (×11): qty 1

## 2022-09-01 NOTE — Evaluation (Signed)
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 PMH includes PD, HTN, asthma, DM, dementia, OSA, RLS.  Clinical Impression  Patient presents with generalized weakness, decreased AROM BLEs/UEs, impaired balance, pain and impaired mobility s/p above. Pt is from [facility] and requires assist for all care. Pt needs help to transfer to w/c and BSC from staff and is non ambulatory. Unable to propel self in w/c. Spouse assists with some care as well. Today, pt requires total A of 2 for bed mobility and for sitting EOB with heavy posterior lean. HR up to 133 bpm with activity, tachycardic throughout. Sp02 remained in 90s on RA. Would benefit from SNF to maximize independence and mobility prior to return home. Will follow acutely.       Recommendations for follow up therapy are one component of a multi-disciplinary discharge planning process, led by the attending physician.  Recommendations may be updated based on patient status, additional functional criteria and insurance authorization.  Follow Up Recommendations Skilled nursing-short term rehab (<3 hours/day) Can patient physically be transported by private vehicle: No    Assistance Recommended at Discharge Frequent or constant Supervision/Assistance  Patient can return home with the following  Two people to help with walking and/or transfers;Two people to help with bathing/dressing/bathroom;Direct supervision/assist for medications management;Direct supervision/assist for financial management;Assistance with cooking/housework;Assist for transportation    Equipment  Recommendations None recommended by PT  Recommendations for Other Services       Functional Status Assessment Patient has had a recent decline in their functional status and/or demonstrates limited ability to make significant improvements in function in a reasonable and predictable amount of time     Precautions / Restrictions Precautions Precautions: Fall;Other (comment) Precaution Comments: watch HR, drain, ostomy Restrictions Weight Bearing Restrictions: No      Mobility  Bed Mobility Overal bed mobility: Needs Assistance Bed Mobility: Supine to Sit, Sit to Supine     Supine to sit: +2 for physical assistance, Total assist Sit to supine: Total assist, +2 for physical assistance   General bed mobility comments: Total A to get to EOB and return to supine. POsterior pelvic tilt sitting EOB with heavy posterior lean.    Transfers                   General transfer comment: Transfer deferred as pt transferring to bed out of ICU    Ambulation/Gait                  Stairs            Wheelchair Mobility    Modified Rankin (Stroke Patients Only)       Balance Overall balance assessment: Needs assistance Sitting-balance support: Feet unsupported, No upper extremity supported Sitting balance-Leahy Scale: Zero Sitting balance - Comments: Total A for bed mobility with posterior bias/lean Postural control: Posterior lean     Standing balance comment: Not attempted but likley unable due to poor DF AROM                             Pertinent Vitals/Pain Pain Assessment Pain Assessment: Faces Faces Pain  Scale: Hurts little more Pain Location: abdomen Pain Descriptors / Indicators: Sore, Operative site guarding Pain Intervention(s): Monitored during session, Repositioned    Home Living Family/patient expects to be discharged to:: Assisted living                   Additional Comments: From [facility]    Prior Function  Prior Level of Function : Needs assist       Physical Assist : ADLs (physical);Mobility (physical) Mobility (physical): Transfers ADLs (physical): Bathing;Dressing;Toileting;Grooming Mobility Comments: Has not walked in a long time. Left foot drop. Has transport chair. Husband pushes her to dinner hall, otherwise meals gets delivered to room., Assist with All care ADLs Comments: Staff assists with bathing, dressing, transfers. Has shower twice a week.     Hand Dominance        Extremity/Trunk Assessment   Upper Extremity Assessment Upper Extremity Assessment: Defer to OT evaluation    Lower Extremity Assessment Lower Extremity Assessment: RLE deficits/detail;LLE deficits/detail;Generalized weakness RLE Deficits / Details: Not able to get to neutral for ankle AROM, lacking full knee extension AROM. 1/5 hip flexion LLE Deficits / Details: Left foot drop, minimal to no DF present. This is baseline, lacking full knee extension AROM, 1/5 hip flexion    Cervical / Trunk Assessment Cervical / Trunk Assessment: Kyphotic;Other exceptions Cervical / Trunk Exceptions: positioned in right rotation/SB chronically  Communication   Communication: No difficulties  Cognition Arousal/Alertness: Awake/alert Behavior During Therapy: WFL for tasks assessed/performed Overall Cognitive Status: Impaired/Different from baseline Area of Impairment: Orientation, Attention, Memory, Problem solving, Following commands                 Orientation Level: Disoriented to, Situation, Time Current Attention Level: Selective Memory: Decreased short-term memory Following Commands: Follows one step commands consistently     Problem Solving: Decreased initiation, Slow processing, Difficulty sequencing, Requires verbal cues, Requires tactile cues General Comments: Per daughter, pt is a little loopy since surgery, and cognition waxes and wanes at baseline, esp with sundowning.        General Comments  General comments (skin integrity, edema, etc.): HR tachycardic up to 133 bpm with activity. SP02 in 90s on RA. Daughter present during session.    Exercises     Assessment/Plan    PT Assessment Patient needs continued PT services  PT Problem List Decreased strength;Decreased range of motion;Decreased mobility;Impaired tone;Decreased activity tolerance;Decreased cognition;Cardiopulmonary status limiting activity;Decreased skin integrity;Pain;Decreased balance;Decreased knowledge of use of DME       PT Treatment Interventions Therapeutic activities;Therapeutic exercise;Patient/family education;Wheelchair mobility training;Balance training;Functional mobility training;Cognitive remediation;DME instruction    PT Goals (Current goals can be found in the Care Plan section)  Acute Rehab PT Goals Patient Stated Goal: to be able to get myself to the [facility] PT Goal Formulation: With patient/family Time For Goal Achievement: 09/15/22 Potential to Achieve Goals: Fair    Frequency Min 2X/week     Co-evaluation               AM-PAC PT "6 Clicks" Mobility  Outcome Measure Help needed turning from your back to your side while in a flat bed without using bedrails?: Total Help needed moving from lying on your back to sitting on the side of a flat bed without using bedrails?: Total Help needed moving to and from a bed to a chair (including a wheelchair)?: Total Help needed standing up from a chair using your arms (e.g., wheelchair or bedside chair)?: Total Help needed to walk in  hospital room?: Total Help needed climbing 3-5 steps with a railing? : Total 6 Click Score: 6    End of Session   Activity Tolerance: Patient tolerated treatment well;Patient limited by fatigue Patient left: in bed;with call bell/phone within reach;with family/visitor present Nurse Communication: Mobility status;Need for lift equipment PT Visit Diagnosis: Pain;Muscle weakness (generalized) (M62.81);Unsteadiness on  feet (R26.81);Difficulty in walking, not elsewhere classified (R26.2) Pain - part of body:  (abdomen)    Time: [id number] PT Time Calculation (min) (ACUTE ONLY): 37 min   Charges:   PT Evaluation $PT Eval Moderate Complexity: 1 Mod          Marisa Severin, PT, DPT Acute Rehabilitation Services Secure chat preferred Office [phone]     Marguarite Arbour [facility] [place] [employer] 09/01/2022, 11:10 AM

## 2022-09-01 NOTE — Consult Note (Signed)
[facility] Nurse ostomy follow up Stoma type/location: LMQ transverse colostomy  Stomal assessment/size: 2" red moist edematous  Peristomal assessment: intact  Treatment options for stomal/peristomal skin: 2" barrier ring  Output minimal bloody effluent in pouch  Ostomy pouching: 2 pc 2 3/4" skin barrier Kellie Simmering #2), 2 3/4" pouch Kellie Simmering [phone]) and 2" barrier ring Kellie Simmering [phone])  Education provided: Daughter in room.  Daughter states patient was living with her husband at [facility] but that she will require at least a short term SNF stay after this surgery.  Daughter agrees to receive education today on ostomy care.  Daughter does not think patients husband will be able to manage ostomy.   Educated daughter on need to change ostomy appliance twice a week and if leaking.  Also demonstrated how to size stoma and how this will need to be done with every pouch change for the next 4-6 weeks until stoma stabilizes. Discussed the importance of emptying the pouch when 1/3 to 1/2 full, how to use the lock and roll mechanism and to clean the spout with toilet paper wick.  Demonstrated to daughter how to remove the pouch using the push pull method, cleaned around stoma with water moistened washcloth only.  Discussed that we do not recommend use of any baby wipes, soaps, creams as these leave a residue.  Demonstrated placing 2" barrier ring around stoma.  Daughter was able to snap pouch onto skin barrier.  Demonstrated to daughter placing skin barrier and pouch over ostomy.  Daughter used lock and roll to close bag.    Discussed that patient may shower with bag on.  Answered daughters questions.  Educational booklet gone over with daughter.    Enrolled patient in [place] Start Discharge program: Yes  WOC will follow this patient for further ostomy education and support.  Supplies ordered at this visit.   Thank you,    Valen Mascaro MSN, RN-BC, [employer]

## 2022-09-01 NOTE — Progress Notes (Signed)
Pt noted to have MAP less than 65, but increases after pt is woken up. Elink informed of episodes of MAP being less than 65.

## 2022-09-01 NOTE — Evaluation (Signed)
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 PMH includes PD, HTN, asthma, DM, dementia, OSA, RLS.   Clinical Impression   Pt currently with functional limitations due to the deficits listed below (see OT Problem List). Pt lives in an ALF with her husband and receives assist for BADL such as bathing, dressing, and functional transfers. Pt is able to feed herself and participate in functional transfers. She has not been able to walk in a very long time. Pt will benefit from skilled OT to increase their safety and independence with ADL and functional mobility for ADL to facilitate discharge to venue listed below. Family verbalized that they are not planning on her returning to [facility] and are open to short term post acute rehab while they figure out where her discharge destination will be.        Recommendations for follow up therapy are one component of a multi-disciplinary discharge planning process, led by the attending physician.  Recommendations may be updated based on patient status, additional functional criteria and insurance authorization.   Follow Up Recommendations  Skilled nursing-short term rehab (<3 hours/day)     Assistance Recommended at Discharge Frequent or constant Supervision/Assistance  Patient can return home with the following Two people to help with walking and/or transfers;A lot of help with bathing/dressing/bathroom;Assistance with feeding;Assist for transportation;Help with stairs or ramp for entrance    Functional Status Assessment  Patient has had a recent decline in their functional  status and demonstrates the ability to make significant improvements in function in a reasonable and predictable amount of time.  Equipment Recommendations  Other (comment) (defer to next venue of care)       Precautions / Restrictions Precautions Precautions: Fall;Other (comment) Precaution Comments: watch HR, JP drain, ostomy Restrictions Weight Bearing Restrictions: No      Mobility Bed Mobility Overal bed mobility: Needs Assistance Bed Mobility: Supine to Sit, Sit to Supine     Supine to sit: +2 for physical assistance, Total assist Sit to supine: Total assist, +2 for physical assistance   General bed mobility comments: Total A to get to EOB and return to supine. Posterior pelvic tilt sitting EOB with heavy posterior lean.    Transfers       General transfer comment: Transfer deferred as pt transferring to bed out of ICU      Balance Overall balance assessment: Needs assistance Sitting-balance support: Feet unsupported, No upper extremity supported Sitting balance-Leahy Scale: Zero Sitting balance - Comments: Total A for bed mobility with posterior bias/lean Postural control: Posterior lean     Standing balance comment: Not attempted but likley unable due to poor DF AROM         ADL either performed or assessed with clinical judgement   ADL Overall ADL's : Needs assistance/impaired   General ADL Comments: Pt at baseline requires assist for all ADL tasks. She is typically able to feed herself and transfers to/from the bathroom. At this time, pt requires increased assistance to complete self-feeding. Unable to assess transfer at eval due to pt being transferred to new unit.     Vision Baseline Vision/History: 0 No visual deficits Ability to See in Adequate  Light: 0 Adequate Patient Visual Report: No change from baseline Vision Assessment?: No apparent visual deficits     Perception Perception Perception Tested?: No   Praxis Praxis Praxis tested?: Not  tested    Pertinent Vitals/Pain Pain Assessment Pain Assessment: Faces Faces Pain Scale: Hurts little more Pain Location: abdomen Pain Descriptors / Indicators: Sore, Operative site guarding Pain Intervention(s): Monitored during session, Limited activity within patient's tolerance     Hand Dominance Right   Extremity/Trunk Assessment Upper Extremity Assessment Upper Extremity Assessment: RUE deficits/detail;LUE deficits/detail RUE Deficits / Details: Limited A/ROM in shoulder, elbow, wrist, and hand. Grossly 3-/5 in strength. Edema present from elbow to hand. Functional gross grasp. LUE Deficits / Details: Limited A/ROM in shoulder, elbow, wrist, and hand. Grossly 3-/5 in strength. Functional gross grasp.   Lower Extremity Assessment Lower Extremity Assessment: Defer to PT evaluation RLE Deficits / Details: Not able to get to neutral for ankle AROM, lacking full knee extension AROM. 1/5 hip flexion LLE Deficits / Details: Left foot drop, minimal to no DF present. This is baseline, lacking full knee extension AROM, 1/5 hip flexion   Cervical / Trunk Assessment Cervical / Trunk Assessment: Kyphotic;Other exceptions Cervical / Trunk Exceptions: positioned in right rotation/SB chronically   Communication Communication Communication: No difficulties   Cognition Arousal/Alertness: Awake/alert Behavior During Therapy: WFL for tasks assessed/performed Overall Cognitive Status: Impaired/Different from baseline Area of Impairment: Orientation, Attention, Memory, Problem solving, Following commands                 Orientation Level: Disoriented to, Situation, Time Current Attention Level: Selective Memory: Decreased short-term memory Following Commands: Follows one step commands consistently     Problem Solving: Decreased initiation, Slow processing, Difficulty sequencing, Requires verbal cues, Requires tactile cues General Comments: Per daughter, pt is a little loopy since  surgery, and cognition waxes and wanes at baseline, esp with sundowning.     General Comments  HR tachycardic up to 133 bpm with activity. SP02 in 90s on RA. Daughter present during session.            Home Living Family/patient expects to be discharged to:: Assisted living   Home Equipment: Transport chair   Additional Comments: From [facility]      Prior Functioning/Environment Prior Level of Function : Needs assist       Physical Assist : ADLs (physical);Mobility (physical) Mobility (physical): Transfers ADLs (physical): Bathing;Dressing;Toileting;Grooming Mobility Comments: Has not walked in a long time. Left foot drop. Has transport chair. Husband pushes her to dinner hall, otherwise meals gets delivered to room., Assist with All care ADLs Comments: Staff assists with bathing, dressing, transfers. Has shower twice a week.        OT Problem List: Decreased strength;Decreased range of motion;Decreased activity tolerance;Impaired balance (sitting and/or standing);Decreased knowledge of use of DME or AE;Impaired UE functional use      OT Treatment/Interventions: Self-care/ADL training;Therapeutic exercise;Neuromuscular education;Energy conservation;DME and/or AE instruction;Modalities;Manual therapy;Therapeutic activities;Patient/family education;Balance training    OT Goals(Current goals can be found in the care plan section) Acute Rehab OT Goals Patient Stated Goal: to be able to feed myself and get on and off the toilet OT Goal Formulation: With patient/family Time For Goal Achievement: 09/15/22 Potential to Achieve Goals: Fair  OT Frequency: Min 2X/week    Co-evaluation PT/OT/SLP Co-Evaluation/Treatment: Yes Reason for Co-Treatment: For patient/therapist safety;To address functional/ADL transfers   OT goals addressed during session: Strengthening/ROM      AM-PAC OT "6 Clicks" Daily Activity  Outcome Measure Help from another person eating meals?:  Total Help from another person taking care of personal grooming?: Total Help from another person toileting, which includes using toliet, bedpan, or urinal?: Total Help from another person bathing (including washing, rinsing, drying)?: Total Help from another person to put on and taking off regular upper body clothing?: Total Help from another person to put on and taking off regular lower body clothing?: Total 6 Click Score: 6   End of Session Nurse Communication: Mobility status  Activity Tolerance: Patient tolerated treatment well Patient left: in bed;with call bell/phone within reach;with bed alarm set;with family/visitor present  OT Visit Diagnosis: Muscle weakness (generalized) (M62.81);Feeding difficulties (R63.3)                Time: [id number] OT Time Calculation (min): 36 min Charges:  OT General Charges $OT Visit: 1 Visit OT Evaluation $OT Eval Moderate Complexity: 1 Mod  [employer], OTR/L,CBIS  Supplemental OT - [facility] Secure Chat Preferred    Karielle Davidow, Clarene Duke 09/01/2022, 11:28 AM

## 2022-09-01 NOTE — Progress Notes (Signed)
Progress Note  2 Days Post-Op  Subjective: Patient reports some abdominal pain but not severe. Reports she takes tramadol as needed for back pain at home but I do not see this in med list (?previous medication). Denies nausea. Would like to mobilize today.    Objective: Vital signs in last 24 hours: Temp:  [98.6 F (37 C)-100.7 F (38.2 C)] 98.7 F (37.1 C) (02/23 0400) Pulse Rate:  [87-134] 113 (02/23 0700) Resp:  [0-23] 13 (02/23 0700) BP: (96-155)/(45-77) 115/59 (02/23 0700) SpO2:  [94 %-100 %] 97 % (02/23 0700) Arterial Line BP: (99-164)/(39-64) 99/39 (02/22 1300) Weight:  [60.1 kg] 60.1 kg (02/23 0400)    Intake/Output from previous day: 02/22 0701 - 02/23 0700 In: 1160.4 [IV Piggyback:1160.4] Out: [id number] [Urine:1275; Drains:240; Stool:10] Intake/Output this shift: No intake/output data recorded.  PE: General: WD, female who is laying in bed in NAD Lungs: CTAB, respiratory effort non-labored Abd: soft, moderately distended, appropriate TTP, midline with staples intact and small amount of SS drainage with penrose from distal wound. Ostomy with stoma pink and viable, air and SS fluid in bag, JP with SS fluid in bulb Skin: warm and dry GU: foley draining clear yellow urine   Lab Results:  Recent Labs    08/30/22 1729 08/31/22 0530  WBC 13.5* 13.5*  HGB 10.2* 9.3*  HCT 29.6* 26.8*  PLT 253 227    BMET Recent Labs    08/30/22 2235 08/31/22 0530  NA 138 138  K 3.0* 3.9  CL 106 108  CO2 24 24  GLUCOSE 232* 168*  BUN 5* <5*  CREATININE 0.65 0.65  CALCIUM 8.3* 8.2*    PT/INR Recent Labs    08/29/22 2210  LABPROT 14.3  INR 1.1    CMP     Component Value Date/Time   NA 138 08/31/2022 0530   K 3.9 08/31/2022 0530   CL 108 08/31/2022 0530   CO2 24 08/31/2022 0530   GLUCOSE 168 (H) 08/31/2022 0530   BUN <5 (L) 08/31/2022 0530   CREATININE 0.65 08/31/2022 0530   CALCIUM 8.2 (L) 08/31/2022 0530   PROT 7.0 08/29/2022 2210   ALBUMIN 2.7 (L)  08/29/2022 2210   AST 15 08/29/2022 2210   ALT 6 08/29/2022 2210   ALKPHOS 113 08/29/2022 2210   BILITOT 0.1 (L) 08/29/2022 2210   GFRNONAA >60 08/31/2022 0530   Lipase     Component Value Date/Time   LIPASE 31 08/29/2022 2210       Studies/Results: No results found.  Anti-infectives: Anti-infectives (From admission, onward)    Start     Dose/Rate Route Frequency Ordered Stop   08/31/22 0030  vancomycin (VANCOREADY) IVPB 750 mg/150 mL        750 mg 150 mL/hr over 60 Minutes Intravenous Every 24 hours 08/30/22 1102     08/30/22 1500  metroNIDAZOLE (FLAGYL) IVPB 500 mg        500 mg 100 mL/hr over 60 Minutes Intravenous Every 12 hours 08/30/22 1013     08/30/22 1200  ceFEPIme (MAXIPIME) 2 g in sodium chloride 0.9 % 100 mL IVPB        2 g 200 mL/hr over 30 Minutes Intravenous Every 12 hours 08/30/22 1102     08/29/22 2245  ceFEPIme (MAXIPIME) 2 g in sodium chloride 0.9 % 100 mL IVPB        2 g 200 mL/hr over 30 Minutes Intravenous  Once 08/29/22 2244 08/30/22 0017   08/29/22 2245  metroNIDAZOLE (FLAGYL)  IVPB 500 mg        500 mg 100 mL/hr over 60 Minutes Intravenous  Once 08/29/22 2244 08/30/22 0407   08/29/22 2245  vancomycin (VANCOCIN) IVPB 1000 mg/200 mL premix        1,000 mg 200 mL/hr over 60 Minutes Intravenous  Once 08/29/22 2244 08/30/22 0231        Assessment/Plan Stercoral perforation of the abdomen 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 Dr. 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    LOS: 2 days   Norm Parcel, [facility] 09/01/2022, 8:05 AM Please see Amion for pager number during day hours 7:00am-4:30pm

## 2022-09-01 NOTE — Progress Notes (Signed)
NAME:  Alyssa Boyd, MRN:  [id number], DOB:  1941/03/17, LOS: 2 ADMISSION DATE:  08/29/2022, CONSULTATION DATE:  08/30/22 REFERRING MD:  Stechschulte CHIEF COMPLAINT:  Abd pain   History of Present Illness:  Alyssa Boyd is a 82 y.o. female who has a PMH as below and who resides at [facility]. She presented to [facility] ED 2/20 with rectal bleeding and abdominal pai.  She had abd CT that revealed stercoral colitis with evidence of bleeding and anterior rectal perforation with possible fistulous communication with he upper vagina along with bilateral hydroureteronephrosis.  She was evaluated by general surgery and was taken to the OR for emergent open left colectomy with low anterior resection and creation of distal transverse colostomy, bladder injury repair, rectovaginal fistula takedown with vaginal repair.  Post operatively, she returned to the ICU on the ventilator and PCCM asked to admit.  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  Significant Hospital Events: Including procedures, antibiotic start and stop dates in addition to other pertinent events   2/21 > admit, taken to OR for emergent surgical repair of rectal perforation and rectovaginal fistula.  Interim History / Subjective:  No overnight issues Remain afebrile  Objective:  Blood pressure (!) 145/66, pulse (!) 116, temperature 99.2 F (37.3 C), temperature source Oral, resp. rate 15, height '5\' 5"'$  (1.651 m), weight 60.1 kg, SpO2 99 %.        Intake/Output Summary (Last 24 hours) at 09/01/2022 0902 Last data filed at 09/01/2022 0830 Gross per 24 hour  Intake 1220.42  ml  Output 1335 ml  Net -114.58 ml   Filed Weights   08/29/22 2202 08/31/22 0300 09/01/22 0400  Weight: 54 kg 59.6 kg 60.1 kg    Examination: Physical exam: General: Chronically ill-appearing female, lying on the bed HEENT: [place]/AT, eyes anicteric.  moist mucus membranes Neuro: Alert, awake following commands Chest: Coarse breath sounds, no wheezes or rhonchi Heart: Regular rate and rhythm, no murmurs or gallops Abdomen: Soft, nontender, nondistended, bowel sounds present Skin: No rash   Assessment and plan: Stercoral perforation with rectovaginal fistula - s/p emergent surgical repair 2/21 (open left colectomy with low anterior resection and creation of distal transverse colostomy, bladder injury repair, rectovaginal fistula takedown with vaginal repair) General surgery is following Recommend continuing clear liquid diet until bowel function regains Switch antibiotics to Zosyn Cultures have been negative so far  Acute respiratory insufficiency postprocedure COPD, not in exacerbation Successfully extubated yesterday Continue Budesonide/Brovana in lieu of home Advair. Continue as needed albuterol  Hypokalemia/Hypocalcemia/hypomagnesemia Continue aggressive electrolyte supplement and monitor  Anemia of critical illness H&H remained stable, transfuse if less than 7  HTN, HLD. Hold home ASA, Atorvastatin, Furosemide, Telmisartan Blood pressure has been stable Resume Lopressor as patient is tachycardic and slightly hypertensive   Well-controlled diabetes type 2 Patient's hemoglobin A1c is 5.8 Continue sliding scale insulin with CBG goal 140-180  Best practice (evaluated daily):  Diet/type: Clear liquid diet DVT prophylaxis: prophylactic heparin  GI prophylaxis: PPI Lines: Arterial Line Foley:  Yes, and it is still needed Code Status:  full code Last date of multidisciplinary goals of care discussion: Patient daughter was updated at bedside, decision was to continue  full scope of care  Labs   CBC:  Recent Labs  Lab 08/29/22 2210 08/30/22 0039 08/30/22 0224 08/30/22 0322 08/30/22 0553 08/30/22 0635 08/30/22 0743 08/30/22 1729 08/31/22 0530  WBC 12.5*  --  15.4*  --   --   --   --  13.5* 13.5*  NEUTROABS 10.1*  --   --   --   --   --   --   --   --   HGB 10.0*   < > 8.6*   < > 8.2* 9.2* 9.9* 10.2* 9.3*  HCT 30.0*   < > 24.0*   < > 24.0* 27.0* 29.0* 29.6* 26.8*  MCV 90.1  --  92.3  --   --   --   --  87.8 89.6  PLT 434*  --  330  --   --   --   --  253 227   < > = values in this interval not displayed.    Basic Metabolic Panel: Recent Labs  Lab 08/29/22 2210 08/30/22 0039 08/30/22 0307 08/30/22 0322 08/30/22 0553 08/30/22 0635 08/30/22 0743 08/30/22 1126 08/30/22 2235 08/31/22 0530  NA 134* 130*  --  130*   < > 137 138 137 138 138  K 3.7 6.6*   < > 5.6*   < > 3.3* 3.4* 3.3* 3.0* 3.9  CL 93* 94*  --  97*  --   --   --  108 106 108  CO2 30  --   --   --   --   --   --  '22 24 24  '$ GLUCOSE 170* 159*  --  177*  --   --   --  233* 232* 168*  BUN 11 15  --  12  --   --   --  6* 5* <5*  CREATININE 0.92 0.90  --  0.70  --   --   --  0.72 0.65 0.65  CALCIUM 9.1  --   --   --   --   --   --  7.8* 8.3* 8.2*  MG  --   --   --   --   --   --   --   --   --  1.6*  PHOS  --   --   --   --   --   --   --   --   --  2.0*   < > = values in this interval not displayed.   GFR: Estimated Creatinine Clearance: 50.5 mL/min (by C-G formula based on SCr of 0.65 mg/dL). Recent Labs  Lab 08/29/22 2210 08/30/22 0029 08/30/22 0224 08/30/22 1729 08/31/22 0530  WBC 12.5*  --  15.4* 13.5* 13.5*  LATICACIDVEN 2.3* 2.4*  --   --  0.9    Liver Function Tests: Recent Labs  Lab 08/29/22 2210  AST 15  ALT 6  ALKPHOS 113  BILITOT 0.1*  PROT 7.0  ALBUMIN 2.7*   Recent Labs  Lab 08/29/22 2210  LIPASE 31   No results for input(s): "AMMONIA" in the last 168 hours.  ABG    Component Value Date/Time   PHART 7.348 (L) 08/30/2022 0743   PCO2ART  44.6 08/30/2022 0743   PO2ART 236 (H) 08/30/2022 0743   HCO3 24.5 08/30/2022 0743   TCO2 26 08/30/2022 0743   ACIDBASEDEF 1.0 08/30/2022 0743   O2SAT 100 08/30/2022 0743     Coagulation Profile: Recent Labs  Lab 08/29/22 2210  INR 1.1    Cardiac Enzymes: No results  for input(s): "CKTOTAL", "CKMB", "CKMBINDEX", "TROPONINI" in the last 168 hours.  HbA1C: Hgb A1c MFr Bld  Date/Time Value Ref Range Status  08/31/2022 01:01 PM 5.8 (H) 4.8 - 5.6 % Final    Comment:    (NOTE) Pre diabetes:          5.7%-6.4%  Diabetes:              >6.4%  Glycemic control for   <7.0% adults with diabetes     CBG: Recent Labs  Lab 08/31/22 1502 08/31/22 1947 08/31/22 2312 09/01/22 0320 09/01/22 0751  GLUCAP 178* 99 132* 99 106*     Jacky Kindle, MD [facility] See Amion for pager If no response to pager, please call [phone] until 7pm After 7pm, Please call E-link [phone]

## 2022-09-02 DIAGNOSIS — I10 Essential (primary) hypertension: Secondary | ICD-10-CM

## 2022-09-02 DIAGNOSIS — D649 Anemia, unspecified: Secondary | ICD-10-CM | POA: Diagnosis not present

## 2022-09-02 DIAGNOSIS — K631 Perforation of intestine (nontraumatic): Secondary | ICD-10-CM | POA: Diagnosis not present

## 2022-09-02 LAB — TYPE AND SCREEN
ABO/RH(D): O POS
Antibody Screen: NEGATIVE
Unit division: 0
Unit division: 0
Unit division: 0
Unit division: 0
Unit division: 0

## 2022-09-02 LAB — GLUCOSE, CAPILLARY
Glucose-Capillary: 104 mg/dL — ABNORMAL HIGH (ref 70–99)
Glucose-Capillary: 114 mg/dL — ABNORMAL HIGH (ref 70–99)
Glucose-Capillary: 119 mg/dL — ABNORMAL HIGH (ref 70–99)
Glucose-Capillary: 124 mg/dL — ABNORMAL HIGH (ref 70–99)
Glucose-Capillary: 125 mg/dL — ABNORMAL HIGH (ref 70–99)
Glucose-Capillary: 64 mg/dL — ABNORMAL LOW (ref 70–99)
Glucose-Capillary: 86 mg/dL (ref 70–99)

## 2022-09-02 LAB — BPAM RBC
Blood Product Expiration Date: 202403212359
Blood Product Expiration Date: 202403222359
Blood Product Expiration Date: 202403222359
Blood Product Expiration Date: 202403222359
Blood Product Expiration Date: 202403222359
ISSUE DATE / TIME: 202402210249
ISSUE DATE / TIME: 202402210400
ISSUE DATE / TIME: 202402210400
ISSUE DATE / TIME: 202402210400
ISSUE DATE / TIME: 202402210400
Unit Type and Rh: 5100
Unit Type and Rh: 5100
Unit Type and Rh: 5100
Unit Type and Rh: 5100
Unit Type and Rh: 5100

## 2022-09-02 LAB — SURGICAL PATHOLOGY

## 2022-09-02 MED ORDER — ENSURE ENLIVE PO LIQD
237.0000 mL | Freq: Three times a day (TID) | ORAL | Status: DC
  Administered 2022-09-02 – 2022-09-06 (×8): 237 mL via ORAL

## 2022-09-02 NOTE — TOC Progression Note (Signed)
Transition of Care [facility]) - Initial/Assessment Note    Patient Details  Name: Alyssa Boyd MRN: [id number] Date of Birth: [birth year]  Transition of Care [facility]) CM/SW Contact:    Milinda Antis, [place] Phone Number: 09/02/2022, 11:35 AM  Clinical Narrative:                 RE: Alyssa Boyd Date of Birth: 1940-12-06 Date: 09/02/2022  Please be advised that the above-named patient will require a short-term nursing home stay - anticipated 30 days or less for rehabilitation and strengthening.  The plan is for return home.  Expected Discharge Plan: Skilled Nursing Facility Barriers to Discharge: Continued Medical Work up   Patient Goals and CMS Choice            Expected Discharge Plan and Services In-house Referral: Clinical Social Work     Living arrangements for the past 2 months: [place]                                      Prior Living Arrangements/Services Living arrangements for the past 2 months: [place] Lives with:: Spouse Patient language and need for interpreter reviewed:: Yes Do you feel safe going back to the place where you live?: Yes      Need for Family Participation in Patient Care: Yes (Comment) Care giver support system in place?: Yes (comment)   Criminal Activity/Legal Involvement Pertinent to Current Situation/Hospitalization: No - Comment as needed  Activities of Daily Living      Permission Sought/Granted                  Emotional Assessment       Orientation: : Oriented to Self, Oriented to Place Alcohol / Substance Use: Not Applicable Psych Involvement: No (comment)  Admission diagnosis:  Lower abdominal pain [R10.30] Sepsis, due to unspecified organism, unspecified whether acute organ dysfunction present ([place]) [A41.9] Perforation bowel ([place]) [K63.1] Patient Active Problem List   Diagnosis Date Noted   Perforation bowel ([place]) 08/30/2022   Acute encephalopathy 09/21/2021   COPD  (chronic obstructive pulmonary disease) ([place])    Hyperlipidemia    AMS (altered mental status) 123456   Acute metabolic encephalopathy 123456   Parkinson disease 09/20/2021   HTN (hypertension) 09/20/2021   Type 2 diabetes mellitus ([place]) 09/20/2021   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

## 2022-09-02 NOTE — Progress Notes (Signed)
Progress Note  3 Days Post-Op  Subjective: Denies pain or nausea. Does not remember how much she drank yesterday. States at baseline she is wheelchair bound and spends most of the day in bed.    Objective: Vital signs in last 24 hours: Temp:  [97.9 F (36.6 C)-98.5 F (36.9 C)] 98.2 F (36.8 C) (02/24 0856) Pulse Rate:  [89-123] 90 (02/24 0856) Resp:  [14-17] 16 (02/24 0856) BP: (118-144)/(61-75) 130/75 (02/24 0856) SpO2:  [95 %-100 %] 98 % (02/24 0856) Last BM Date : 09/02/22  Intake/Output from previous day: 02/23 0701 - 02/24 0700 In: 375 [P.O.:375] Out: 1125 [Urine:1050; Drains:75] Intake/Output this shift: No intake/output data recorded.  PE: General: WD, female who is laying in bed in NAD Lungs: CTAB, respiratory effort non-labored Abd: soft, mild distention, appropriate TTP, midline with staples intact and small amount of SS drainage with penrose from distal wound. Ostomy with stoma pink and viable, small amt nonbloody stool in pouch, JP with SS fluid in bulb Skin: warm and dry GU: foley draining clear yellow urine   Lab Results:  Recent Labs    08/31/22 0530 09/01/22 1154  WBC 13.5* 12.0*  HGB 9.3* 9.3*  HCT 26.8* 25.9*  PLT 227 280   BMET Recent Labs    08/31/22 0530 09/01/22 1154  NA 138 136  K 3.9 3.7  CL 108 104  CO2 24 24  GLUCOSE 168* 132*  BUN <5* 5*  CREATININE 0.65 0.59  CALCIUM 8.2* 8.0*   PT/INR No results for input(s): "LABPROT", "INR" in the last 72 hours. CMP     Component Value Date/Time   NA 136 09/01/2022 1154   K 3.7 09/01/2022 1154   CL 104 09/01/2022 1154   CO2 24 09/01/2022 1154   GLUCOSE 132 (H) 09/01/2022 1154   BUN 5 (L) 09/01/2022 1154   CREATININE 0.59 09/01/2022 1154   CALCIUM 8.0 (L) 09/01/2022 1154   PROT 7.0 08/29/2022 2210   ALBUMIN 2.7 (L) 08/29/2022 2210   AST 15 08/29/2022 2210   ALT 6 08/29/2022 2210   ALKPHOS 113 08/29/2022 2210   BILITOT 0.1 (L) 08/29/2022 2210   GFRNONAA >60 09/01/2022 1154    Lipase     Component Value Date/Time   LIPASE 31 08/29/2022 2210       Studies/Results: No results found.  Anti-infectives: Anti-infectives (From admission, onward)    Start     Dose/Rate Route Frequency Ordered Stop   09/01/22 1200  piperacillin-tazobactam (ZOSYN) IVPB 3.375 g        3.375 g 12.5 mL/hr over 240 Minutes Intravenous Every 8 hours 09/01/22 1015     08/31/22 0030  vancomycin (VANCOREADY) IVPB 750 mg/150 mL  Status:  Discontinued        750 mg 150 mL/hr over 60 Minutes Intravenous Every 24 hours 08/30/22 1102 09/01/22 1015   08/30/22 1500  metroNIDAZOLE (FLAGYL) IVPB 500 mg  Status:  Discontinued        500 mg 100 mL/hr over 60 Minutes Intravenous Every 12 hours 08/30/22 1013 09/01/22 1015   08/30/22 1200  ceFEPIme (MAXIPIME) 2 g in sodium chloride 0.9 % 100 mL IVPB  Status:  Discontinued        2 g 200 mL/hr over 30 Minutes Intravenous Every 12 hours 08/30/22 1102 09/01/22 1015   08/29/22 2245  ceFEPIme (MAXIPIME) 2 g in sodium chloride 0.9 % 100 mL IVPB        2 g 200 mL/hr over 30 Minutes Intravenous  Once 08/29/22 2244 08/30/22 0017   08/29/22 2245  metroNIDAZOLE (FLAGYL) IVPB 500 mg        500 mg 100 mL/hr over 60 Minutes Intravenous  Once 08/29/22 2244 08/30/22 0407   08/29/22 2245  vancomycin (VANCOCIN) IVPB 1000 mg/200 mL premix        1,000 mg 200 mL/hr over 60 Minutes Intravenous  Once 08/29/22 2244 08/30/22 0231        Assessment/Plan Stercoral perforation of the abdomen POD3 s/p 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 Dr. Lanny Hurst 2/21 - operative findings of stercoral perforation of the rectum with fistula to the vagina - appreciate TRH care - advance to FLD, ensure; would not advance until more bowel function  - continue foley at least 14 days s/p bladder repair - JP SS, continue - bowel regimen BID colace and miralax to start today - PT/OT, mobilize -  NOTHING PER RECTUM   FEN: FLD, ensure TID, IVF per CCM ID: cefepime, flagyl, vanc VTE: lovenox  I reviewed last 24 h vitals and pain scores, last 48 h intake and output, last 24 h labs and trends, and CCM notes .    LOS: 3 days   [facility] 09/02/2022, 9:20 AM Please see Amion for pager number during day hours 7:00am-4:30pm

## 2022-09-02 NOTE — Progress Notes (Signed)
[facility] PROGRESS NOTE   Alyssa Boyd [id number] DOB: 03-29-1941 DOA: 08/29/2022  PCP: Joya Gaskins, FNP  Brief History/Interval Summary: 82 y.o. female with a past medical history of Parkinson's disease, essential hypertension, diabetes mellitus type 2, hyperlipidemia who resides at [facility]. She presented to [facility] ED 2/20 with rectal bleeding and abdominal pain.  She had a CT scan which revealed stercoral colitis with evidence of bleeding and anterior rectal perforation with possible fistulous communication with he upper vagina along with bilateral hydroureteronephrosis. She was evaluated by general surgery and was taken to the OR for emergent open left colectomy with low anterior resection and creation of distal transverse colostomy, bladder injury repair, rectovaginal fistula takedown with vaginal repair. Postoperatively she was intubated and initially admitted to the ICU.  She was stabilized and then transferred to the floor.  Consultants: General surgery  Procedures:  left colectomy with low anterior resection and creation of distal transverse colostomy, bladder injury repair, rectovaginal fistula takedown with vaginal repair    Subjective/Interval History: Patient denies any abdominal pain this morning.  She is trying to eat Jell-O.  Denies any nausea or vomiting.    Assessment/Plan:  Stercoral perforation with rectovaginal fistula She is status post emergent surgical repair on 2/21.  Underwent open left colectomy with low anterior resection and creation of distal transverse colostomy.  She also underwent bladder injury repair and rectovaginal fistula takedown with vaginal repair. General surgery is following and managing. Patient noted to be on Zosyn.  Acute respiratory failure postprocedure Was intubated in the ICU.  Successfully extubated.  Respiratory status is stable.  History of COPD Stable.  Continue inhalers.  Anemia of critical  illness Hemoglobin is stable.  No evidence of overt bleeding.  Hypophosphatemia Will recheck labs tomorrow.  Essential hypertension/hyperlipidemia Monitor blood pressures.  Holding aspirin statin furosemide telmisartan.  Patient was started back on metoprolol as she was noted to be tachycardic.  Diabetes mellitus type 2, controlled HbA1c 5.8.  SSI.  DVT Prophylaxis: Lovenox Code Status: Full code Family Communication: Discussed with patient Disposition Plan: SNF  Status is: Inpatient Remains inpatient appropriate because: Bowel perforation      Medications: Scheduled:  sodium chloride   Intravenous Once   acetaminophen  1,000 mg Oral Q8H   arformoterol  15 mcg Nebulization BID   budesonide (PULMICORT) nebulizer solution  0.5 mg Nebulization BID   busPIRone  5 mg Oral BID   carbidopa-levodopa  1 tablet Oral QID   Chlorhexidine Gluconate Cloth  6 each Topical Daily   docusate sodium  100 mg Oral BID   donepezil  5 mg Oral QHS   enoxaparin (LOVENOX) injection  40 mg Subcutaneous Q24H   feeding supplement  1 Container Oral TID BM   feeding supplement  237 mL Oral TID WC   insulin aspart  0-9 Units Subcutaneous Q4H   metoprolol tartrate  50 mg Oral BID   multivitamin with minerals  1 tablet Oral Daily   polyethylene glycol  17 g Oral BID   senna  1 tablet Oral Daily   Continuous:  methocarbamol (ROBAXIN) IV     piperacillin-tazobactam (ZOSYN)  IV 3.375 g (09/02/22 0442)   [id number], HYDROmorphone (DILAUDID) injection, labetalol, methocarbamol (ROBAXIN) IV, ondansetron (ZOFRAN) IV, mouth rinse, traMADol  Antibiotics: Anti-infectives (From admission, onward)    Start     Dose/Rate Route Frequency Ordered Stop   09/01/22 1200  piperacillin-tazobactam (ZOSYN) IVPB 3.375 g        3.375 g 12.5  mL/hr over 240 Minutes Intravenous Every 8 hours 09/01/22 1015     08/31/22 0030  vancomycin (VANCOREADY) IVPB 750 mg/150 mL  Status:  Discontinued        750 mg 150 mL/hr  over 60 Minutes Intravenous Every 24 hours 08/30/22 1102 09/01/22 1015   08/30/22 1500  metroNIDAZOLE (FLAGYL) IVPB 500 mg  Status:  Discontinued        500 mg 100 mL/hr over 60 Minutes Intravenous Every 12 hours 08/30/22 1013 09/01/22 1015   08/30/22 1200  ceFEPIme (MAXIPIME) 2 g in sodium chloride 0.9 % 100 mL IVPB  Status:  Discontinued        2 g 200 mL/hr over 30 Minutes Intravenous Every 12 hours 08/30/22 1102 09/01/22 1015   08/29/22 2245  ceFEPIme (MAXIPIME) 2 g in sodium chloride 0.9 % 100 mL IVPB        2 g 200 mL/hr over 30 Minutes Intravenous  Once 08/29/22 2244 08/30/22 0017   08/29/22 2245  metroNIDAZOLE (FLAGYL) IVPB 500 mg        500 mg 100 mL/hr over 60 Minutes Intravenous  Once 08/29/22 2244 08/30/22 0407   08/29/22 2245  vancomycin (VANCOCIN) IVPB 1000 mg/200 mL premix        1,000 mg 200 mL/hr over 60 Minutes Intravenous  Once 08/29/22 2244 08/30/22 0231       Objective:  Vital Signs  Vitals:   09/01/22 2045 09/02/22 0421 09/02/22 0749 09/02/22 0856  BP: 128/65 (!) 140/64  130/75  Pulse: 98 95  90  Resp: '17 17  16  '$ Temp: 97.9 F (36.6 C) 98 F (36.7 C)  98.2 F (36.8 C)  TempSrc:    Oral  SpO2: 99% 100% 98% 98%  Weight:      Height:        Intake/Output Summary (Last 24 hours) at 09/02/2022 1049 Last data filed at 09/02/2022 0500 Gross per 24 hour  Intake 315 ml  Output 1125 ml  Net -810 ml   Filed Weights   08/29/22 2202 08/31/22 0300 09/01/22 0400  Weight: 54 kg 59.6 kg 60.1 kg    General appearance: Awake alert.  In no distress Resp: Clear to auscultation bilaterally.  Normal effort Cardio: S1-S2 is normal regular.  No S3-S4.  No rubs murmurs or bruit GI: Abdomen is soft.  Ostomy noted. Extremities: No edema.  Full range of motion of lower extremities. Neurologic:  No focal neurological deficits.    Lab Results:  Data Reviewed: I have personally reviewed following labs and reports of the imaging studies  CBC: Recent Labs  Lab  08/29/22 2210 08/30/22 0039 08/30/22 0224 08/30/22 0322 08/30/22 0635 08/30/22 0743 08/30/22 1729 08/31/22 0530 09/01/22 1154  WBC 12.5*  --  15.4*  --   --   --  13.5* 13.5* 12.0*  NEUTROABS 10.1*  --   --   --   --   --   --   --   --   HGB 10.0*   < > 8.6*   < > 9.2* 9.9* 10.2* 9.3* 9.3*  HCT 30.0*   < > 24.0*   < > 27.0* 29.0* 29.6* 26.8* 25.9*  MCV 90.1  --  92.3  --   --   --  87.8 89.6 90.6  PLT 434*  --  330  --   --   --  253 227 280   < > = values in this interval not displayed.    Basic Metabolic  Panel: Recent Labs  Lab 08/29/22 2210 08/30/22 0039 08/30/22 0322 08/30/22 0553 08/30/22 0743 08/30/22 1126 08/30/22 2235 08/31/22 0530 09/01/22 1154  NA 134*   < > 130*   < > 138 137 138 138 136  K 3.7   < > 5.6*   < > 3.4* 3.3* 3.0* 3.9 3.7  CL 93*   < > 97*  --   --  108 106 108 104  CO2 30  --   --   --   --  '22 24 24 24  '$ GLUCOSE 170*   < > 177*  --   --  233* 232* 168* 132*  BUN 11   < > 12  --   --  6* 5* <5* 5*  CREATININE 0.92   < > 0.70  --   --  0.72 0.65 0.65 0.59  CALCIUM 9.1  --   --   --   --  7.8* 8.3* 8.2* 8.0*  MG  --   --   --   --   --   --   --  1.6* 2.1  PHOS  --   --   --   --   --   --   --  2.0* 2.2*   < > = values in this interval not displayed.    GFR: Estimated Creatinine Clearance: 50.5 mL/min (by C-G formula based on SCr of 0.59 mg/dL).  Liver Function Tests: Recent Labs  Lab 08/29/22 2210  AST 15  ALT 6  ALKPHOS 113  BILITOT 0.1*  PROT 7.0  ALBUMIN 2.7*    Recent Labs  Lab 08/29/22 2210  LIPASE 31   Coagulation Profile: Recent Labs  Lab 08/29/22 2210  INR 1.1     HbA1C: Recent Labs    08/31/22 1301  HGBA1C 5.8*    CBG: Recent Labs  Lab 09/01/22 2042 09/02/22 0014 09/02/22 0054 09/02/22 0419 09/02/22 0729  GLUCAP 124* 64* 86 119* 104*      Recent Results (from the past 240 hour(s))  Urine Culture (for pregnant, neutropenic or urologic patients or patients with an indwelling urinary catheter)      Status: None   Collection Time: 08/29/22 10:44 PM   Specimen: Urine, Clean Catch  Result Value Ref Range Status   Specimen Description URINE, CLEAN CATCH  Final   Special Requests NONE  Final   Culture   Final    NO GROWTH Performed at [facility], [place] [address], [place], [place] [address]    Report Status 08/31/2022 FINAL  Final  Resp panel by RT-PCR (RSV, Flu A&B, Covid) Anterior Nasal Swab     Status: None   Collection Time: 08/30/22 12:30 AM   Specimen: Anterior Nasal Swab  Result Value Ref Range Status   SARS Coronavirus 2 by RT PCR NEGATIVE NEGATIVE Final   Influenza A by PCR NEGATIVE NEGATIVE Final   Influenza B by PCR NEGATIVE NEGATIVE Final    Comment: (NOTE) The Xpert Xpress SARS-CoV-2/FLU/RSV plus assay is intended as an aid in the diagnosis of influenza from Nasopharyngeal swab specimens and should not be used as a sole basis for treatment. Nasal washings and aspirates are unacceptable for Xpert Xpress SARS-CoV-2/FLU/RSV testing.  Fact Sheet for Patients: EntrepreneurPulse.com.au  Fact Sheet for Healthcare Providers: IncredibleEmployment.be  This test is not yet approved or cleared by the [place] FDA and has been authorized for detection and/or diagnosis of SARS-CoV-2 by FDA under an Emergency Use Authorization (EUA).  This EUA will remain in effect (meaning this test can be used) for the duration of the COVID-19 declaration under Section 564(b)(1) of the Act, 21 U.S.C. section 360bbb-3(b)(1), unless the authorization is terminated or revoked.     Resp Syncytial Virus by PCR NEGATIVE NEGATIVE Final    Comment: (NOTE) Fact Sheet for Patients: EntrepreneurPulse.com.au  Fact Sheet for Healthcare Providers: IncredibleEmployment.be  This test is not yet approved or cleared by the [place] FDA and has been authorized for detection and/or diagnosis of SARS-CoV-2 by FDA  under an Emergency Use Authorization (EUA). This EUA will remain in effect (meaning this test can be used) for the duration of the COVID-19 declaration under Section 564(b)(1) of the Act, 21 U.S.C. section 360bbb-3(b)(1), unless the authorization is terminated or revoked.  Performed at [facility], [place] [address] [place], [place] [address]   MRSA Next Gen by PCR, Nasal     Status: None   Collection Time: 08/30/22 10:08 AM   Specimen: Nasal Mucosa; Nasal Swab  Result Value Ref Range Status   MRSA by PCR Next Gen NOT DETECTED NOT DETECTED Final    Comment: (NOTE) The GeneXpert MRSA Assay (FDA approved for NASAL specimens only), is one component of a comprehensive MRSA colonization surveillance program. It is not intended to diagnose MRSA infection nor to guide or monitor treatment for MRSA infections. Test performance is not FDA approved in patients less than 26 years old. Performed at [facility], [place] [address] [place], [place] [address]   Blood Culture (routine x 2)     Status: None (Preliminary result)   Collection Time: 08/30/22 12:11 PM   Specimen: BLOOD RIGHT HAND  Result Value Ref Range Status   Specimen Description BLOOD RIGHT HAND  Final   Special Requests   Final    BOTTLES DRAWN AEROBIC AND ANAEROBIC Blood Culture results may not be optimal due to an inadequate volume of blood received in culture bottles   Culture   Final    NO GROWTH 3 DAYS Performed at [facility], [place] [address], [place], [place] [address]    Report Status PENDING  Incomplete  Blood Culture (routine x 2)     Status: None (Preliminary result)   Collection Time: 08/30/22 12:19 PM   Specimen: BLOOD RIGHT HAND  Result Value Ref Range Status   Specimen Description BLOOD RIGHT HAND  Final   Special Requests   Final    BOTTLES DRAWN AEROBIC AND ANAEROBIC Blood Culture adequate volume   Culture   Final    NO GROWTH 3 DAYS Performed at [facility], [place] [address]  [place], [place] [address]    Report Status PENDING  Incomplete      Radiology Studies: No results found.     LOS: 3 days   Nattaly [employer] on [email or web address]  09/02/2022, 10:49 AM

## 2022-09-02 NOTE — NC FL2 (Signed)
[place] LEVEL OF CARE FORM     IDENTIFICATION  Patient Name: Alyssa Boyd Birthdate: 07/21/1940 Sex: female Admission Date (Current Location): 08/29/2022  [facility] and [place] Number:  Herbalist and Address:  [facility], [place] [address] [place], [place] [address]      Provider Number: [id number]  Attending Physician Name and Address:  Bonnielee Haff, MD  Relative Name and Phone Number:  PETROVITCH,JENNY (Daughter) [phone]    Current Level of Care: Hospital Recommended Level of Care: [place] Prior Approval Number:    Date Approved/Denied:   PASRR Number: pending  Discharge Plan: SNF    Current Diagnoses: Patient Active Problem List   Diagnosis Date Noted   Perforation bowel ([place]) 08/30/2022   Acute encephalopathy 09/21/2021   COPD (chronic obstructive pulmonary disease) ([place])    Hyperlipidemia    AMS (altered mental status) 123456   Acute metabolic encephalopathy 123456   Parkinson disease 09/20/2021   HTN (hypertension) 09/20/2021   Type 2 diabetes mellitus ([place]) 09/20/2021   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   Foot fracture, left, sequela 09/20/2021   Hypokalemia 09/20/2021   Hypomagnesemia 09/20/2021    Orientation RESPIRATION BLADDER Height & Weight     Self, Place  Normal Incontinent Weight: 125 lb 10.6 oz (57 kg) Height:  '5\' 5"'$  (165.1 cm)  BEHAVIORAL SYMPTOMS/MOOD NEUROLOGICAL BOWEL NUTRITION STATUS      Incontinent Diet (see d/c summary)  AMBULATORY STATUS COMMUNICATION OF NEEDS Skin   Total Care Verbally Surgical wounds (abdomen)                       Personal Care Assistance Level of Assistance  Bathing, Feeding, Dressing Bathing Assistance: Maximum assistance Feeding assistance: Limited assistance Dressing Assistance: Maximum assistance     Functional Limitations Info  Sight, Hearing, Speech Sight Info: Adequate Hearing Info: Adequate Speech  Info: Adequate    SPECIAL CARE FACTORS FREQUENCY  PT (By licensed PT), OT (By licensed OT)     PT Frequency: 5x/ week OT Frequency: 5x/ week            Contractures Contractures Info: Not present    Additional Factors Info  Code Status, Allergies, Insulin Sliding Scale Code Status Info: Full Allergies Info: Shellfish Allergy   Insulin Sliding Scale Info: see d/c med list       Current Medications (09/02/2022):  This is the current hospital active medication list Current Facility-Administered Medications  Medication Dose Route Frequency Provider Last Rate Last Admin   0.9 %  sodium chloride infusion (Manually program via Guardrails IV Fluids)   Intravenous Once Clovis Riley, MD       acetaminophen (TYLENOL) tablet 1,000 mg  1,000 mg Oral Q8H Barkley Boards R, PA-C   1,000 mg at 09/02/22 0501   arformoterol (BROVANA) nebulizer solution 15 mcg  15 mcg Nebulization BID Shearon Stalls, Rahul P, PA-C   15 mcg at 09/02/22 0749   budesonide (PULMICORT) nebulizer solution 0.5 mg  0.5 mg Nebulization BID Shearon Stalls, Rahul P, PA-C   0.5 mg at 09/02/22 0749   busPIRone (BUSPAR) tablet 5 mg  5 mg Oral BID Norm Parcel, PA-C   5 mg at 09/02/22 [id number]   carbidopa-levodopa (SINEMET IR) 25-100 MG per tablet immediate release 1 tablet  1 tablet Oral QID Levie [place], MD   1 tablet at 09/02/22 0824   Chlorhexidine Gluconate Cloth 2 % PADS 6 each  6 each  Topical Daily Hunsucker, Bonna Gains, MD   6 each at 09/02/22 0825   docusate sodium (COLACE) capsule 100 mg  100 mg Oral BID Norm Parcel, PA-C   100 mg at 09/02/22 [id number]   donepezil (ARICEPT) tablet 5 mg  5 mg Oral QHS Paliwal, Aditya, MD   5 mg at 09/01/22 2058   enoxaparin (LOVENOX) injection 40 mg  40 mg Subcutaneous Q24H Hunsucker, Bonna Gains, MD   40 mg at 09/01/22 2057   feeding supplement (BOOST / RESOURCE BREEZE) liquid 1 Container  1 Container Oral TID BM Jacky Kindle, MD   1 Container at 09/02/22 0825   feeding supplement (ENSURE ENLIVE /  ENSURE PLUS) liquid 237 mL  237 mL Oral TID WC Simaan, Elizabeth S, PA-C       hydrALAZINE (APRESOLINE) injection 5-10 mg  5-10 mg Intravenous Q4H PRN [place] Crane M, PA-C       HYDROmorphone (DILAUDID) injection 0.5 mg  0.5 mg Intravenous Q2H PRN Romana Juniper A, MD   0.5 mg at 09/01/22 1224   insulin aspart (novoLOG) injection 0-9 Units  0-9 Units Subcutaneous Q4H Hunsucker, Bonna Gains, MD   1 Units at 09/01/22 2059   labetalol (NORMODYNE) injection 10 mg  10 mg Intravenous Q10 min PRN Lestine Mount, PA-C   10 mg at 08/31/22 1256   methocarbamol (ROBAXIN) 500 mg in dextrose 5 % 50 mL IVPB  500 mg Intravenous Q6H PRN Romana Juniper A, MD       metoprolol tartrate (LOPRESSOR) tablet 50 mg  50 mg Oral BID Jacky Kindle, MD   50 mg at 09/02/22 [id number]   multivitamin with minerals tablet 1 tablet  1 tablet Oral Daily Jacky Kindle, MD   1 tablet at 09/02/22 0824   ondansetron (ZOFRAN) injection 4 mg  4 mg Intravenous Q6H PRN Clovis Riley, MD       Oral care mouth rinse  15 mL Mouth Rinse PRN Hunsucker, Bonna Gains, MD       piperacillin-tazobactam (ZOSYN) IVPB 3.375 g  3.375 g Intravenous Q8H Chand, Currie Paris, MD 12.5 mL/hr at 09/02/22 0442 3.375 g at 09/02/22 0442   polyethylene glycol (MIRALAX / GLYCOLAX) packet 17 g  17 g Oral BID Norm Parcel, PA-C   17 g at 09/02/22 [id number]   senna (SENOKOT) tablet 8.6 mg  1 tablet Oral Daily Norm Parcel, PA-C   8.6 mg at 09/02/22 [id number]   traMADol (ULTRAM) tablet 50 mg  50 mg Oral Q6H PRN Norm Parcel, PA-C   50 mg at 09/02/22 0501     Discharge Medications: Please see discharge summary for a list of discharge medications.  Relevant Imaging Results:  Relevant Lab Results:   Additional Information SSN: [id number]  Ibrahim Mcpheeters F Yani [employer], LCSWA

## 2022-09-02 NOTE — TOC Initial Note (Addendum)
Transition of Care [facility]) - Initial/Assessment Note    Patient Details  Name: Alyssa Boyd MRN: [id number] Date of Birth: 1940-08-30  Transition of Care [facility]) CM/SW Contact:    Milinda Antis, Start Phone Number: 09/02/2022, 10:27 AM  Clinical Narrative:                 CSW received consult for possible SNF placement at time of discharge. CSW spoke with patient's spouse.  The spouse expressed understanding of PT's recommendation and will discuss the recommendation with his daughter and inform LCSW of the family's decision.    1100-  LCSW received a returned call from the patient's spouse.  The family is in agreement with the patient going to a SNF when medically.  The spouse has a preference for a facility near [place].  CSW discussed insurance authorization process and will provide Medicare SNF ratings list. CSW will send out referrals for review and provide bed offers as available.  TOC will continue to follow.   Expected Discharge Plan: Skilled Nursing Facility Barriers to Discharge: Continued Medical Work up   Patient Goals and CMS Choice            Expected Discharge Plan and Services In-house Referral: Clinical Social Work     Living arrangements for the past 2 months: [place]                                      Prior Living Arrangements/Services Living arrangements for the past 2 months: [place] Lives with:: Spouse Patient language and need for interpreter reviewed:: Yes Do you feel safe going back to the place where you live?: Yes      Need for Family Participation in Patient Care: Yes (Comment) Care giver support system in place?: Yes (comment)   Criminal Activity/Legal Involvement Pertinent to Current Situation/Hospitalization: No - Comment as needed  Activities of Daily Living      Permission Sought/Granted                  Emotional Assessment       Orientation: : Oriented to Self, Oriented  to Place Alcohol / Substance Use: Not Applicable Psych Involvement: No (comment)  Admission diagnosis:  Lower abdominal pain [R10.30] Sepsis, due to unspecified organism, unspecified whether acute organ dysfunction present ([place]) [A41.9] Perforation bowel ([place]) [K63.1] Patient Active Problem List   Diagnosis Date Noted   Perforation bowel ([place]) 08/30/2022   Acute encephalopathy 09/21/2021   COPD (chronic obstructive pulmonary disease) ([place])    Hyperlipidemia    AMS (altered mental status) 123456   Acute metabolic encephalopathy 123456   Parkinson disease 09/20/2021   HTN (hypertension) 09/20/2021   Type 2 diabetes mellitus ([place]) 09/20/2021   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

## 2022-09-03 DIAGNOSIS — D649 Anemia, unspecified: Secondary | ICD-10-CM | POA: Diagnosis not present

## 2022-09-03 LAB — BASIC METABOLIC PANEL
Anion gap: 13 (ref 5–15)
BUN: <5 mg/dL — ABNORMAL LOW (ref 8–23)
CO2: 22 mmol/L (ref 22–32)
Calcium: 8.2 mg/dL — ABNORMAL LOW (ref 8.9–10.3)
Chloride: 105 mmol/L (ref 98–111)
Creatinine, Ser: 0.9 mg/dL (ref 0.44–1.00)
GFR, Estimated: >60 mL/min (ref >60)
Glucose, Bld: 113 mg/dL — ABNORMAL HIGH (ref 70–99)
Potassium: 3.7 mmol/L (ref 3.5–5.1)
Sodium: 140 mmol/L (ref 135–145)

## 2022-09-03 LAB — MAGNESIUM: Magnesium: 2 mg/dL (ref 1.7–2.4)

## 2022-09-03 LAB — CBC
HCT: 30.8 % — ABNORMAL LOW (ref 36.0–46.0)
Hemoglobin: 10.5 g/dL — ABNORMAL LOW (ref 12.0–15.0)
MCH: 31.4 pg (ref 26.0–34.0)
MCHC: 34.1 g/dL (ref 30.0–36.0)
MCV: 92.2 fL (ref 80.0–100.0)
Platelets: 367 10*3/uL (ref 150–400)
RBC: 3.34 MIL/uL — ABNORMAL LOW (ref 3.87–5.11)
RDW: 13.9 % (ref 11.5–15.5)
WBC: 11.3 10*3/uL — ABNORMAL HIGH (ref 4.0–10.5)
nRBC: 0 % (ref 0.0–0.2)

## 2022-09-03 LAB — GLUCOSE, CAPILLARY
Glucose-Capillary: 114 mg/dL — ABNORMAL HIGH (ref 70–99)
Glucose-Capillary: 140 mg/dL — ABNORMAL HIGH (ref 70–99)
Glucose-Capillary: 142 mg/dL — ABNORMAL HIGH (ref 70–99)
Glucose-Capillary: 180 mg/dL — ABNORMAL HIGH (ref 70–99)
Glucose-Capillary: 235 mg/dL — ABNORMAL HIGH (ref 70–99)

## 2022-09-03 LAB — PHOSPHORUS: Phosphorus: 3.2 mg/dL (ref 2.5–4.6)

## 2022-09-03 NOTE — Progress Notes (Signed)
Progress Note  4 Days Post-Op  Subjective: Tolerating PO. Pain controlled. Having ostomy output. No family at bedside.    Objective: Vital signs in last 24 hours: Temp:  [98.2 F (36.8 C)-98.8 F (37.1 C)] 98.3 F (36.8 C) (02/25 0735) Pulse Rate:  [99-109] 103 (02/25 0735) Resp:  [15-18] 18 (02/25 0735) BP: (111-141)/(51-70) 138/66 (02/25 0735) SpO2:  [98 %-100 %] 99 % (02/25 0859) Weight:  [57 kg] 57 kg (02/24 1004) Last BM Date : 09/03/22  Intake/Output from previous day: 02/24 0701 - 02/25 0700 In: 257 [IV Piggyback:257] Out: 1600 [Urine:1000; Drains:50; Stool:550] Intake/Output this shift: No intake/output data recorded.  PE: General: WD, female who is laying in bed in NAD Lungs: CTAB, respiratory effort non-labored Abd: soft, mild distention, appropriate TTP, midline with staples intact and small amount of SS drainage with penrose from distal wound - no cellulitis. Ostomy with stoma pink and viable - gas and non-bloody stool in pouch. JP with SS fluid in bulb Skin: warm and dry GU: foley draining clear yellow urine Neuro: oriented to person, [place] 2024, and reports she is in the hospital because of surgery  Lab Results:  Recent Labs    09/01/22 1154 09/03/22 0458  WBC 12.0* 11.3*  HGB 9.3* 10.5*  HCT 25.9* 30.8*  PLT 280 367   BMET Recent Labs    09/01/22 1154 09/03/22 0458  NA 136 140  K 3.7 3.7  CL 104 105  CO2 24 22  GLUCOSE 132* 113*  BUN 5* <5*  CREATININE 0.59 0.90  CALCIUM 8.0* 8.2*   PT/INR No results for input(s): "LABPROT", "INR" in the last 72 hours. CMP     Component Value Date/Time   NA 140 09/03/2022 0458   K 3.7 09/03/2022 0458   CL 105 09/03/2022 0458   CO2 22 09/03/2022 0458   GLUCOSE 113 (H) 09/03/2022 0458   BUN <5 (L) 09/03/2022 0458   CREATININE 0.90 09/03/2022 0458   CALCIUM 8.2 (L) 09/03/2022 0458   PROT 7.0 08/29/2022 2210   ALBUMIN 2.7 (L) 08/29/2022 2210   AST 15 08/29/2022 2210   ALT 6 08/29/2022 2210    ALKPHOS 113 08/29/2022 2210   BILITOT 0.1 (L) 08/29/2022 2210   GFRNONAA >60 09/03/2022 0458   Lipase     Component Value Date/Time   LIPASE 31 08/29/2022 2210       Studies/Results: No results found.  Anti-infectives: Anti-infectives (From admission, onward)    Start     Dose/Rate Route Frequency Ordered Stop   09/01/22 1200  piperacillin-tazobactam (ZOSYN) IVPB 3.375 g        3.375 g 12.5 mL/hr over 240 Minutes Intravenous Every 8 hours 09/01/22 1015     08/31/22 0030  vancomycin (VANCOREADY) IVPB 750 mg/150 mL  Status:  Discontinued        750 mg 150 mL/hr over 60 Minutes Intravenous Every 24 hours 08/30/22 1102 09/01/22 1015   08/30/22 1500  metroNIDAZOLE (FLAGYL) IVPB 500 mg  Status:  Discontinued        500 mg 100 mL/hr over 60 Minutes Intravenous Every 12 hours 08/30/22 1013 09/01/22 1015   08/30/22 1200  ceFEPIme (MAXIPIME) 2 g in sodium chloride 0.9 % 100 mL IVPB  Status:  Discontinued        2 g 200 mL/hr over 30 Minutes Intravenous Every 12 hours 08/30/22 1102 09/01/22 1015   08/29/22 2245  ceFEPIme (MAXIPIME) 2 g in sodium chloride 0.9 % 100 mL IVPB  2 g 200 mL/hr over 30 Minutes Intravenous  Once 08/29/22 2244 08/30/22 0017   08/29/22 2245  metroNIDAZOLE (FLAGYL) IVPB 500 mg        500 mg 100 mL/hr over 60 Minutes Intravenous  Once 08/29/22 2244 08/30/22 0407   08/29/22 2245  vancomycin (VANCOCIN) IVPB 1000 mg/200 mL premix        1,000 mg 200 mL/hr over 60 Minutes Intravenous  Once 08/29/22 2244 08/30/22 0231        Assessment/Plan Stercoral perforation of the abdomen POD4 s/p 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 Dr. 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 - continue foley at least 14 days s/p bladder repair - JP  SS, continue - will discuss timing of penrose removal with MD. - PT/OT, mobilize - NOTHING PER RECTUM   FEN: soft, ensure TID, IVF per CCM ID: cefepime, flagyl, vanc - tentative plan to stop abx tomorrow 2/26? VTE: lovenox  I reviewed last 24 h vitals and pain scores, last 48 h intake and output, last 24 h labs and trends, and CCM notes .    LOS: 4 days   [facility] 09/03/2022, 9:31 AM Please see Amion for pager number during day hours 7:00am-4:30pm

## 2022-09-03 NOTE — Progress Notes (Signed)
[facility] PROGRESS NOTE   Alyssa Boyd [id number] DOB: [birth year] DOA: 08/29/2022  PCP: Joya Gaskins, FNP  Brief History/Interval Summary: 82 y.o. female with a past medical history of Parkinson's disease, essential hypertension, diabetes mellitus type 2, hyperlipidemia who resides at [facility]. She presented to [facility] ED 2/20 with rectal bleeding and abdominal pain.  She had a CT scan which revealed stercoral colitis with evidence of bleeding and anterior rectal perforation with possible fistulous communication with he upper vagina along with bilateral hydroureteronephrosis. She was evaluated by general surgery and was taken to the OR for emergent open left colectomy with low anterior resection and creation of distal transverse colostomy, bladder injury repair, rectovaginal fistula takedown with vaginal repair. Postoperatively she was intubated and initially admitted to the ICU.  She was stabilized and then transferred to the floor.  Consultants: General surgery  Procedures:  left colectomy with low anterior resection and creation of distal transverse colostomy, bladder injury repair, rectovaginal fistula takedown with vaginal repair    Subjective/Interval History: Patient feels well.  No nausea or vomiting.  Abdominal pain is well-controlled.    Assessment/Plan:  Stercoral perforation with rectovaginal fistula She is status post emergent surgical repair on 2/21.  Underwent open left colectomy with low anterior resection and creation of distal transverse colostomy.  She also underwent bladder injury repair and rectovaginal fistula takedown with vaginal repair. General surgery is following and managing. Patient noted to be on Zosyn.  Will defer discontinuation of antibiotics to general surgery.  Acute respiratory failure postprocedure Was intubated in the ICU.  Successfully extubated.  Respiratory status is stable.  History of COPD Stable.  Continue  inhalers.  Anemia of critical illness Hemoglobin is stable.  No evidence for overt bleeding.  Hypophosphatemia Improved phosphorus levels noted this morning.  Magnesium is 2.0.  Essential hypertension/hyperlipidemia Monitor blood pressures.  Holding aspirin statin furosemide telmisartan.  Patient was started back on metoprolol as she was noted to be tachycardic.  Diabetes mellitus type 2, controlled HbA1c 5.8.  SSI.  DVT Prophylaxis: Lovenox Code Status: Full code Family Communication: Discussed with patient Disposition Plan: SNF when cleared by general surgery.  Status is: Inpatient Remains inpatient appropriate because: Bowel perforation      Medications: Scheduled:  sodium chloride   Intravenous Once   acetaminophen  1,000 mg Oral Q8H   arformoterol  15 mcg Nebulization BID   budesonide (PULMICORT) nebulizer solution  0.5 mg Nebulization BID   busPIRone  5 mg Oral BID   carbidopa-levodopa  1 tablet Oral QID   Chlorhexidine Gluconate Cloth  6 each Topical Daily   docusate sodium  100 mg Oral BID   donepezil  5 mg Oral QHS   enoxaparin (LOVENOX) injection  40 mg Subcutaneous Q24H   feeding supplement  1 Container Oral TID BM   feeding supplement  237 mL Oral TID WC   insulin aspart  0-9 Units Subcutaneous Q4H   metoprolol tartrate  50 mg Oral BID   multivitamin with minerals  1 tablet Oral Daily   polyethylene glycol  17 g Oral BID   senna  1 tablet Oral Daily   Continuous:  methocarbamol (ROBAXIN) IV     piperacillin-tazobactam (ZOSYN)  IV 12.5 mL/hr at 09/03/22 0542   [id number], HYDROmorphone (DILAUDID) injection, labetalol, methocarbamol (ROBAXIN) IV, ondansetron (ZOFRAN) IV, mouth rinse, traMADol  Antibiotics: Anti-infectives (From admission, onward)    Start     Dose/Rate Route Frequency Ordered Stop   09/01/22 1200  piperacillin-tazobactam (  ZOSYN) IVPB 3.375 g        3.375 g 12.5 mL/hr over 240 Minutes Intravenous Every 8 hours 09/01/22 1015      08/31/22 0030  vancomycin (VANCOREADY) IVPB 750 mg/150 mL  Status:  Discontinued        750 mg 150 mL/hr over 60 Minutes Intravenous Every 24 hours 08/30/22 1102 09/01/22 1015   08/30/22 1500  metroNIDAZOLE (FLAGYL) IVPB 500 mg  Status:  Discontinued        500 mg 100 mL/hr over 60 Minutes Intravenous Every 12 hours 08/30/22 1013 09/01/22 1015   08/30/22 1200  ceFEPIme (MAXIPIME) 2 g in sodium chloride 0.9 % 100 mL IVPB  Status:  Discontinued        2 g 200 mL/hr over 30 Minutes Intravenous Every 12 hours 08/30/22 1102 09/01/22 1015   08/29/22 2245  ceFEPIme (MAXIPIME) 2 g in sodium chloride 0.9 % 100 mL IVPB        2 g 200 mL/hr over 30 Minutes Intravenous  Once 08/29/22 2244 08/30/22 0017   08/29/22 2245  metroNIDAZOLE (FLAGYL) IVPB 500 mg        500 mg 100 mL/hr over 60 Minutes Intravenous  Once 08/29/22 2244 08/30/22 0407   08/29/22 2245  vancomycin (VANCOCIN) IVPB 1000 mg/200 mL premix        1,000 mg 200 mL/hr over 60 Minutes Intravenous  Once 08/29/22 2244 08/30/22 0231       Objective:  Vital Signs  Vitals:   09/02/22 2133 09/03/22 0527 09/03/22 0735 09/03/22 0859  BP:  130/60 138/66   Pulse: 99 (!) 101 (!) 103   Resp: '18 17 18   '$ Temp:  98.2 F (36.8 C) 98.3 F (36.8 C)   TempSrc:  Oral Oral   SpO2: 100% 100% 100% 99%  Weight:      Height:        Intake/Output Summary (Last 24 hours) at 09/03/2022 1003 Last data filed at 09/03/2022 0542 Gross per 24 hour  Intake 256.98 ml  Output 1600 ml  Net -1343.02 ml    Filed Weights   08/31/22 0300 09/01/22 0400 09/02/22 1004  Weight: 59.6 kg 60.1 kg 57 kg    General appearance: Awake alert.  In no distress Resp: Clear to auscultation bilaterally.  Normal effort Cardio: S1-S2 is normal regular.  No S3-S4.  No rubs murmurs or bruit GI: Abdomen is covered with dressing.  Ostomy is noted. Extremities: No edema.     Lab Results:  Data Reviewed: I have personally reviewed following labs and reports of the imaging  studies  CBC: Recent Labs  Lab 08/29/22 2210 08/30/22 0039 08/30/22 0224 08/30/22 0322 08/30/22 0743 08/30/22 1729 08/31/22 0530 09/01/22 1154 09/03/22 0458  WBC 12.5*  --  15.4*  --   --  13.5* 13.5* 12.0* 11.3*  NEUTROABS 10.1*  --   --   --   --   --   --   --   --   HGB 10.0*   < > 8.6*   < > 9.9* 10.2* 9.3* 9.3* 10.5*  HCT 30.0*   < > 24.0*   < > 29.0* 29.6* 26.8* 25.9* 30.8*  MCV 90.1  --  92.3  --   --  87.8 89.6 90.6 92.2  PLT 434*  --  330  --   --  253 227 280 367   < > = values in this interval not displayed.     Basic Metabolic Panel: Recent  Labs  Lab 08/30/22 1126 08/30/22 2235 08/31/22 0530 09/01/22 1154 09/03/22 0458  NA 137 138 138 136 140  K 3.3* 3.0* 3.9 3.7 3.7  CL 108 106 108 104 105  CO2 '22 24 24 24 22  '$ GLUCOSE 233* 232* 168* 132* 113*  BUN 6* 5* <5* 5* <5*  CREATININE 0.72 0.65 0.65 0.59 0.90  CALCIUM 7.8* 8.3* 8.2* 8.0* 8.2*  MG  --   --  1.6* 2.1 2.0  PHOS  --   --  2.0* 2.2* 3.2     GFR: Estimated Creatinine Clearance: 44.9 mL/min (by C-G formula based on SCr of 0.9 mg/dL).  Liver Function Tests: Recent Labs  Lab 08/29/22 2210  AST 15  ALT 6  ALKPHOS 113  BILITOT 0.1*  PROT 7.0  ALBUMIN 2.7*     Recent Labs  Lab 08/29/22 2210  LIPASE 31    Coagulation Profile: Recent Labs  Lab 08/29/22 2210  INR 1.1      HbA1C: Recent Labs    08/31/22 1301  HGBA1C 5.8*     CBG: Recent Labs  Lab 09/02/22 1222 09/02/22 1656 09/02/22 2119 09/03/22 0049 09/03/22 0744  GLUCAP 125* 114* 124* 142* 114*       Recent Results (from the past 240 hour(s))  Urine Culture (for pregnant, neutropenic or urologic patients or patients with an indwelling urinary catheter)     Status: None   Collection Time: 08/29/22 10:44 PM   Specimen: Urine, Clean Catch  Result Value Ref Range Status   Specimen Description URINE, CLEAN CATCH  Final   Special Requests NONE  Final   Culture   Final    NO GROWTH Performed at [facility], [place] [address] [place], [place] [address]    Report Status 08/31/2022 FINAL  Final  Resp panel by RT-PCR (RSV, Flu A&B, Covid) Anterior Nasal Swab     Status: None   Collection Time: 08/30/22 12:30 AM   Specimen: Anterior Nasal Swab  Result Value Ref Range Status   SARS Coronavirus 2 by RT PCR NEGATIVE NEGATIVE Final   Influenza A by PCR NEGATIVE NEGATIVE Final   Influenza B by PCR NEGATIVE NEGATIVE Final    Comment: (NOTE) The Xpert Xpress SARS-CoV-2/FLU/RSV plus assay is intended as an aid in the diagnosis of influenza from Nasopharyngeal swab specimens and should not be used as a sole basis for treatment. Nasal washings and aspirates are unacceptable for Xpert Xpress SARS-CoV-2/FLU/RSV testing.  Fact Sheet for Patients: EntrepreneurPulse.com.au  Fact Sheet for Healthcare Providers: IncredibleEmployment.be  This test is not yet approved or cleared by the [place] FDA and has been authorized for detection and/or diagnosis of SARS-CoV-2 by FDA under an Emergency Use Authorization (EUA). This EUA will remain in effect (meaning this test can be used) for the duration of the COVID-19 declaration under Section 564(b)(1) of the Act, 21 U.S.C. section 360bbb-3(b)(1), unless the authorization is terminated or revoked.     Resp Syncytial Virus by PCR NEGATIVE NEGATIVE Final    Comment: (NOTE) Fact Sheet for Patients: EntrepreneurPulse.com.au  Fact Sheet for Healthcare Providers: IncredibleEmployment.be  This test is not yet approved or cleared by the [place] FDA and has been authorized for detection and/or diagnosis of SARS-CoV-2 by FDA under an Emergency Use Authorization (EUA). This EUA will remain in effect (meaning this test can be used) for the duration of the COVID-19 declaration under Section 564(b)(1) of the Act, 21 U.S.C. section 360bbb-3(b)(1), unless the authorization is  terminated or revoked.  Performed at [facility], [place] [address], [place], [place] [address]   MRSA Next Gen by PCR, Nasal     Status: None   Collection Time: 08/30/22 10:08 AM   Specimen: Nasal Mucosa; Nasal Swab  Result Value Ref Range Status   MRSA by PCR Next Gen NOT DETECTED NOT DETECTED Final    Comment: (NOTE) The GeneXpert MRSA Assay (FDA approved for NASAL specimens only), is one component of a comprehensive MRSA colonization surveillance program. It is not intended to diagnose MRSA infection nor to guide or monitor treatment for MRSA infections. Test performance is not FDA approved in patients less than 32 years old. Performed at [facility], [place] [address], [place], [place] [address]   Blood Culture (routine x 2)     Status: None (Preliminary result)   Collection Time: 08/30/22 12:11 PM   Specimen: BLOOD RIGHT HAND  Result Value Ref Range Status   Specimen Description BLOOD RIGHT HAND  Final   Special Requests   Final    BOTTLES DRAWN AEROBIC AND ANAEROBIC Blood Culture results may not be optimal due to an inadequate volume of blood received in culture bottles   Culture   Final    NO GROWTH 4 DAYS Performed at [facility], [place] [address] [place], [place] [address]    Report Status PENDING  Incomplete  Blood Culture (routine x 2)     Status: None (Preliminary result)   Collection Time: 08/30/22 12:19 PM   Specimen: BLOOD RIGHT HAND  Result Value Ref Range Status   Specimen Description BLOOD RIGHT HAND  Final   Special Requests   Final    BOTTLES DRAWN AEROBIC AND ANAEROBIC Blood Culture adequate volume   Culture   Final    NO GROWTH 4 DAYS Performed at [facility], [place] [address] [place], [place] [address]    Report Status PENDING  Incomplete      Radiology Studies: No results found.     LOS: 4 days   Luiza Carranco [employer] on [email or web address]  09/03/2022, 10:03 AM

## 2022-09-03 NOTE — Progress Notes (Signed)
Incentive spirometry introduced with teach back.

## 2022-09-04 DIAGNOSIS — K631 Perforation of intestine (nontraumatic): Secondary | ICD-10-CM | POA: Diagnosis not present

## 2022-09-04 LAB — AMMONIA: Ammonia: 29 umol/L (ref 9–35)

## 2022-09-04 LAB — CULTURE, BLOOD (ROUTINE X 2)
Culture: NO GROWTH
Culture: NO GROWTH
Special Requests: ADEQUATE

## 2022-09-04 LAB — TSH: TSH: 3.755 u[IU]/mL (ref 0.350–4.500)

## 2022-09-04 LAB — GLUCOSE, CAPILLARY
Glucose-Capillary: 101 mg/dL — ABNORMAL HIGH (ref 70–99)
Glucose-Capillary: 117 mg/dL — ABNORMAL HIGH (ref 70–99)
Glucose-Capillary: 130 mg/dL — ABNORMAL HIGH (ref 70–99)
Glucose-Capillary: 141 mg/dL — ABNORMAL HIGH (ref 70–99)
Glucose-Capillary: 142 mg/dL — ABNORMAL HIGH (ref 70–99)
Glucose-Capillary: 194 mg/dL — ABNORMAL HIGH (ref 70–99)
Glucose-Capillary: 221 mg/dL — ABNORMAL HIGH (ref 70–99)
Glucose-Capillary: 231 mg/dL — ABNORMAL HIGH (ref 70–99)

## 2022-09-04 LAB — FOLATE: Folate: 19.1 ng/mL (ref >5.9)

## 2022-09-04 LAB — VITAMIN B12: Vitamin B-12: 1220 pg/mL — ABNORMAL HIGH (ref 180–914)

## 2022-09-04 MED ORDER — SENNOSIDES-DOCUSATE SODIUM 8.6-50 MG PO TABS: 1.0000 | ORAL_TABLET | Freq: Every evening | ORAL | Status: DC | PRN

## 2022-09-04 MED ORDER — HYDRALAZINE HCL 20 MG/ML IJ SOLN: 10.0000 mg | INTRAMUSCULAR | Status: DC | PRN

## 2022-09-04 MED ORDER — IPRATROPIUM-ALBUTEROL 0.5-2.5 (3) MG/3ML IN SOLN: 3.0000 mL | RESPIRATORY_TRACT | Status: DC | PRN

## 2022-09-04 MED ORDER — ATORVASTATIN CALCIUM 10 MG PO TABS
20.0000 mg | ORAL_TABLET | Freq: Every day | ORAL | Status: DC
  Administered 2022-09-04 – 2022-09-06 (×3): 20 mg via ORAL
  Filled 2022-09-04 (×3): qty 2

## 2022-09-04 MED ORDER — ROPINIROLE HCL ER 4 MG PO TB24
8.0000 mg | ORAL_TABLET | Freq: Every day | ORAL | Status: DC
  Administered 2022-09-04 – 2022-09-05 (×2): 8 mg via ORAL
  Filled 2022-09-04 (×3): qty 2

## 2022-09-04 MED ORDER — IRBESARTAN 75 MG PO TABS
75.0000 mg | ORAL_TABLET | Freq: Every day | ORAL | Status: DC
  Administered 2022-09-04 – 2022-09-06 (×3): 75 mg via ORAL
  Filled 2022-09-04 (×3): qty 1

## 2022-09-04 MED ORDER — METOPROLOL TARTRATE 5 MG/5ML IV SOLN: 5.0000 mg | INTRAVENOUS | Status: DC | PRN

## 2022-09-04 MED ORDER — GUAIFENESIN 100 MG/5ML PO LIQD: 5.0000 mL | ORAL | Status: DC | PRN

## 2022-09-04 NOTE — TOC Progression Note (Addendum)
Transition of Care [facility]) - Progression Note    Patient Details  Name: Denaya Hotchkiss MRN: [id number] Date of Birth: Feb 09, 1941  [facility] CM/SW Contact  Jinger Neighbors, [place] Phone Number: 09/04/2022, 11:02 AM  Clinical Narrative:     CSW uploaded docs to [place] for [place]. CSW reviewed bed offers, printed, and presented to pt. CSW also called and left a vm with pt's dtr Sonia Baller.  CSW received a returned call from [place]. CSW took a picture of bed offers and sent to [place].  Pt PASRR: [id number]  Expected Discharge Plan: [place] Barriers to Discharge: Continued Medical Work up  Expected Discharge Plan and Services In-house Referral: Clinical Social Work     Living arrangements for the past 2 months: [place]                                       Social Determinants of Health (SDOH) Interventions    Readmission Risk Interventions     No data to display

## 2022-09-04 NOTE — Consult Note (Signed)
[place] Nurse ostomy follow up Stoma type/location: LMQ transverse colostomy  IN room alone today Stomal assessment/size:  2" red edematous Peristomal assessment:  intact Treatment options for stomal/peristomal skin:  2 pc 2 3/4" pouch.   Output soft brown stool Ostomy pouching: 2pc.  Education provided:  Noted medical adhesive related skin injury to periphery of pouching area from 4-6 o'clock.  Trimmed adhesive barrier away from this area to promote healing.  Stoma powder and skin prep to irritation.  Enrolled patient in [place] Start Discharge program: Yes Will follow.  Estrellita Ludwig MSN, RN, FNP-BC CWON Wound, Ostomy, Continence Nurse [facility] [phone] Pager [phone]

## 2022-09-04 NOTE — Discharge Instructions (Signed)
[facility]  Please arrive at least 30 min before your appointment to complete your check in paperwork.  If you are unable to arrive 30 min prior to your appointment time we may have to cancel or reschedule you. LAPAROSCOPIC SURGERY: POST OP INSTRUCTIONS Always review your discharge instruction sheet given to you by the facility where your surgery was performed. IF YOU HAVE DISABILITY OR FAMILY LEAVE FORMS, YOU MUST BRING THEM TO THE OFFICE FOR PROCESSING.   DO NOT GIVE THEM TO YOUR DOCTOR.  PAIN CONTROL  First take acetaminophen (Tylenol) AND/or ibuprofen (Advil) to control your pain after surgery.  Follow directions on package.  Taking acetaminophen (Tylenol) and/or ibuprofen (Advil) regularly after surgery will help to control your pain and lower the amount of prescription pain medication you may need.  You should not take more than 4,000 mg (4 grams) of acetaminophen (Tylenol) in 24 hours.  You should not take ibuprofen (Advil), aleve, motrin, naprosyn or other NSAIDS if you have a history of stomach ulcers or chronic kidney disease.  A prescription for pain medication may be given to you upon discharge.  Take your pain medication as prescribed, if you still have uncontrolled pain after taking acetaminophen (Tylenol) or ibuprofen (Advil). Use ice packs to help control pain. If you need a refill on your pain medication, please contact your pharmacy.  They will contact our office to request authorization. Prescriptions will not be filled after 5pm or on week-ends.  HOME MEDICATIONS Take your usually prescribed medications unless otherwise directed.  DIET You should follow a light diet the first few days after arrival home.  Be sure to include lots of fluids daily. Avoid fatty, fried foods.   CONSTIPATION It is common to experience some constipation after surgery and if you are taking pain medication.  Increasing fluid intake and taking a stool softener (such as Colace)  will usually help or prevent this problem from occurring.  A mild laxative (Milk of Magnesia or Miralax) should be taken according to package instructions if there are no bowel movements after 48 hours.  WOUND/INCISION CARE Most patients will experience some swelling and bruising in the area of the incisions.  Ice packs will help.  Swelling and bruising can take several days to resolve.  Unless discharge instructions indicate otherwise, follow guidelines below  STERI-STRIPS - you may remove your outer bandages 48 hours after surgery, and you may shower at that time.  You have steri-strips (small skin tapes) in place directly over the incision.  These strips should be left on the skin for 7-10 days.   DERMABOND/SKIN GLUE - you may shower in 24 hours.  The glue will flake off over the next 2-3 weeks. Any sutures or staples will be removed at the office during your follow-up visit.  ACTIVITIES You may resume regular (light) daily activities beginning the next day--such as daily self-care, walking, climbing stairs--gradually increasing activities as tolerated.  You may have sexual intercourse when it is comfortable.  Refrain from any heavy lifting or straining until approved by your doctor. You may drive when you are no longer taking prescription pain medication, you can comfortably wear a seatbelt, and you can safely maneuver your car and apply brakes.  FOLLOW-UP You should see your doctor in the office for a follow-up appointment approximately 2-3 weeks after your surgery.  You should have been given your post-op/follow-up appointment when your surgery was scheduled.  If you did not receive a post-op/follow-up appointment, make sure   that you call for this appointment within a day or two after you arrive home to insure a convenient appointment time.   WHEN TO CALL YOUR DOCTOR: Fever over 101.0 Inability to urinate Continued bleeding from incision. Increased pain, redness, or drainage from the  incision. Increasing abdominal pain  The clinic staff is available to answer your questions during regular business hours.  Please don't hesitate to call and ask to speak to one of the nurses for clinical concerns.  If you have a medical emergency, go to the nearest emergency room or call 911.  A surgeon from [facility] is always on call at the hospital. [address], [address], [place], [place]  [address] ? [address] [address], , [place]   [address] [phone] ? [phone] ? FAX [phone]  

## 2022-09-04 NOTE — Progress Notes (Addendum)
PROGRESS NOTE    Alyssa Boyd  [id number] DOB: 1940/11/13 DOA: 08/29/2022 PCP: Joya Gaskins, FNP   Brief Narrative:   82 y.o. female with a past medical history of Parkinson's disease, essential hypertension, diabetes mellitus type 2, hyperlipidemia who resides at [facility]. She presented to [facility] ED 2/20 with rectal bleeding and abdominal pain.  She had a CT scan which revealed stercoral colitis with evidence of bleeding and anterior rectal perforation with possible fistulous communication with he upper vagina along with bilateral hydroureteronephrosis. She was evaluated by general surgery and was taken to the OR for emergent open left colectomy with low anterior resection and creation of distal transverse colostomy, bladder injury repair, rectovaginal fistula takedown with vaginal repair. Postoperatively she was intubated and initially admitted to the ICU.  She was stabilized and then transferred to the floor.  Assessment & Plan:  Principal Problem:   Perforation bowel ([place])      Stercoral perforation with rectovaginal fistula She is status post emergent surgical repair on 2/21.  Underwent open left colectomy with low anterior resection and creation of distal transverse colostomy.  She also underwent bladder injury repair and rectovaginal fistula takedown with vaginal repair. General surgery is following and managing.  Initially on Vanco, Flagyl and cefepime > Zosyn. tentative plans to discontinue antibiotics today if okay with general surgery.  Maintain Foley catheter for at least 14 days postoperatively.  Encephalopathy Wonder if this hospital related delirium. Will check TSH, B12, folate, Ammonia. No focal deficits but if AMS persist, may need CT Head to rulle out any acute findings.    Acute respiratory failure postprocedure Intubated in ICU, successfully extubated.  Now stable   History of COPD Stable, as needed bronchodilators   Anemia of critical  illness Hemoglobin is stable.  No evidence for overt bleeding.   Hypophosphatemia Stable   Essential hypertension/hyperlipidemia Metoprolol twice daily. Resume ARB. Aspirin, statin, Lasix on hold.  IV as needed   Diabetes mellitus type 2, controlled HbA1c 5.8.  SSI.   DVT Prophylaxis: Lovenox Code Status: Full code Family Communication: Discussed with patient Disposition Plan: SNF when cleared by general surgery.   Status is: Inpatient Remains inpatient appropriate because: SNF once cleared by general surgery  Signs/Symptoms: other (comment) (CL diet)  Interventions: Boost Breeze  Body mass index is 21.17 kg/m.         Subjective: No complains, doing ok.    Examination:  General exam: Appears calm and comfortable  Respiratory system: Clear to auscultation. Respiratory effort normal. Cardiovascular system: S1 & S2 heard, RRR. No JVD, murmurs, rubs, gallops or clicks. No pedal edema. Gastrointestinal system: Abdomen is nondistended, soft and nontender. No organomegaly or masses felt. Normal bowel sounds heard. Central nervous system: Alert and oriented. No focal neurological deficits. Extremities: Symmetric 5 x 5 power. Skin: abd dressing in place.  Psychiatry: Judgement and insight appear normal. Mood & affect appropriate.     Objective: Vitals:   09/03/22 2011 09/03/22 2013 09/03/22 2038 09/04/22 0510  BP:   (!) 126/50 (!) 135/55  Pulse:   (!) 107 89  Resp:   17 16  Temp:   98.8 F (37.1 C) 98.7 F (37.1 C)  TempSrc:   Oral Oral  SpO2: 99% 99% 97% 99%  Weight:      Height:        Intake/Output Summary (Last 24 hours) at 09/04/2022 0726 Last data filed at 09/04/2022 [id number] Gross per 24 hour  Intake 50 ml  Output 550 ml  Net -500 ml   Filed Weights   09/01/22 0400 09/02/22 1004 09/03/22 1700  Weight: 60.1 kg 57 kg 57.7 kg     Data Reviewed:   CBC: Recent Labs  Lab 08/29/22 2210 08/30/22 0039 08/30/22 0224 08/30/22 0322 08/30/22 0743  08/30/22 1729 08/31/22 0530 09/01/22 1154 09/03/22 0458  WBC 12.5*  --  15.4*  --   --  13.5* 13.5* 12.0* 11.3*  NEUTROABS 10.1*  --   --   --   --   --   --   --   --   HGB 10.0*   < > 8.6*   < > 9.9* 10.2* 9.3* 9.3* 10.5*  HCT 30.0*   < > 24.0*   < > 29.0* 29.6* 26.8* 25.9* 30.8*  MCV 90.1  --  92.3  --   --  87.8 89.6 90.6 92.2  PLT 434*  --  330  --   --  253 227 280 367   < > = values in this interval not displayed.   Basic Metabolic Panel: Recent Labs  Lab 08/30/22 1126 08/30/22 2235 08/31/22 0530 09/01/22 1154 09/03/22 0458  NA 137 138 138 136 140  K 3.3* 3.0* 3.9 3.7 3.7  CL 108 106 108 104 105  CO2 '22 24 24 24 22  '$ GLUCOSE 233* 232* 168* 132* 113*  BUN 6* 5* <5* 5* <5*  CREATININE 0.72 0.65 0.65 0.59 0.90  CALCIUM 7.8* 8.3* 8.2* 8.0* 8.2*  MG  --   --  1.6* 2.1 2.0  PHOS  --   --  2.0* 2.2* 3.2   GFR: Estimated Creatinine Clearance: 44.9 mL/min (by C-G formula based on SCr of 0.9 mg/dL). Liver Function Tests: Recent Labs  Lab 08/29/22 2210  AST 15  ALT 6  ALKPHOS 113  BILITOT 0.1*  PROT 7.0  ALBUMIN 2.7*   Recent Labs  Lab 08/29/22 2210  LIPASE 31   No results for input(s): "AMMONIA" in the last 168 hours. Coagulation Profile: Recent Labs  Lab 08/29/22 2210  INR 1.1   Cardiac Enzymes: No results for input(s): "CKTOTAL", "CKMB", "CKMBINDEX", "TROPONINI" in the last 168 hours. BNP (last 3 results) No results for input(s): "PROBNP" in the last 8760 hours. HbA1C: No results for input(s): "HGBA1C" in the last 72 hours. CBG: Recent Labs  Lab 09/03/22 1644 09/03/22 2128 09/04/22 0056 09/04/22 0457 09/04/22 0508  GLUCAP 180* 140* 101* 130* 117*   Lipid Profile: No results for input(s): "CHOL", "HDL", "LDLCALC", "TRIG", "CHOLHDL", "LDLDIRECT" in the last 72 hours. Thyroid Function Tests: No results for input(s): "TSH", "T4TOTAL", "FREET4", "T3FREE", "THYROIDAB" in the last 72 hours. Anemia Panel: No results for input(s): "VITAMINB12",  "FOLATE", "FERRITIN", "TIBC", "IRON", "RETICCTPCT" in the last 72 hours. Sepsis Labs: Recent Labs  Lab 08/29/22 2210 08/30/22 0029 08/31/22 0530  LATICACIDVEN 2.3* 2.4* 0.9    Recent Results (from the past 240 hour(s))  Urine Culture (for pregnant, neutropenic or urologic patients or patients with an indwelling urinary catheter)     Status: None   Collection Time: 08/29/22 10:44 PM   Specimen: Urine, Clean Catch  Result Value Ref Range Status   Specimen Description URINE, CLEAN CATCH  Final   Special Requests NONE  Final   Culture   Final    NO GROWTH Performed at [facility], [place] [address] [place], [place] [address]    Report Status 08/31/2022 FINAL  Final  Resp panel by RT-PCR (RSV, Flu A&B, Covid) Anterior Nasal Swab  Status: None   Collection Time: 08/30/22 12:30 AM   Specimen: Anterior Nasal Swab  Result Value Ref Range Status   SARS Coronavirus 2 by RT PCR NEGATIVE NEGATIVE Final   Influenza A by PCR NEGATIVE NEGATIVE Final   Influenza B by PCR NEGATIVE NEGATIVE Final    Comment: (NOTE) The Xpert Xpress SARS-CoV-2/FLU/RSV plus assay is intended as an aid in the diagnosis of influenza from Nasopharyngeal swab specimens and should not be used as a sole basis for treatment. Nasal washings and aspirates are unacceptable for Xpert Xpress SARS-CoV-2/FLU/RSV testing.  Fact Sheet for Patients: EntrepreneurPulse.com.au  Fact Sheet for Healthcare Providers: IncredibleEmployment.be  This test is not yet approved or cleared by the [place] FDA and has been authorized for detection and/or diagnosis of SARS-CoV-2 by FDA under an Emergency Use Authorization (EUA). This EUA will remain in effect (meaning this test can be used) for the duration of the COVID-19 declaration under Section 564(b)(1) of the Act, 21 U.S.C. section 360bbb-3(b)(1), unless the authorization is terminated or revoked.     Resp Syncytial Virus by PCR  NEGATIVE NEGATIVE Final    Comment: (NOTE) Fact Sheet for Patients: EntrepreneurPulse.com.au  Fact Sheet for Healthcare Providers: IncredibleEmployment.be  This test is not yet approved or cleared by the [place] FDA and has been authorized for detection and/or diagnosis of SARS-CoV-2 by FDA under an Emergency Use Authorization (EUA). This EUA will remain in effect (meaning this test can be used) for the duration of the COVID-19 declaration under Section 564(b)(1) of the Act, 21 U.S.C. section 360bbb-3(b)(1), unless the authorization is terminated or revoked.  Performed at [facility], [place] [address], [place], [place] [address]   MRSA Next Gen by PCR, Nasal     Status: None   Collection Time: 08/30/22 10:08 AM   Specimen: Nasal Mucosa; Nasal Swab  Result Value Ref Range Status   MRSA by PCR Next Gen NOT DETECTED NOT DETECTED Final    Comment: (NOTE) The GeneXpert MRSA Assay (FDA approved for NASAL specimens only), is one component of a comprehensive MRSA colonization surveillance program. It is not intended to diagnose MRSA infection nor to guide or monitor treatment for MRSA infections. Test performance is not FDA approved in patients less than 69 years old. Performed at [facility], [place] [address] [place], [place] [address]   Blood Culture (routine x 2)     Status: None (Preliminary result)   Collection Time: 08/30/22 12:11 PM   Specimen: BLOOD RIGHT HAND  Result Value Ref Range Status   Specimen Description BLOOD RIGHT HAND  Final   Special Requests   Final    BOTTLES DRAWN AEROBIC AND ANAEROBIC Blood Culture results may not be optimal due to an inadequate volume of blood received in culture bottles   Culture   Final    NO GROWTH 4 DAYS Performed at [facility], [place] [address], [place], [place] [address]    Report Status PENDING  Incomplete  Blood Culture (routine x 2)     Status: None (Preliminary result)    Collection Time: 08/30/22 12:19 PM   Specimen: BLOOD RIGHT HAND  Result Value Ref Range Status   Specimen Description BLOOD RIGHT HAND  Final   Special Requests   Final    BOTTLES DRAWN AEROBIC AND ANAEROBIC Blood Culture adequate volume   Culture   Final    NO GROWTH 4 DAYS Performed at [facility], [place],  [place] [address]    Report Status PENDING  Incomplete         Radiology Studies: No results found.      Scheduled Meds:  sodium chloride   Intravenous Once   acetaminophen  1,000 mg Oral Q8H   arformoterol  15 mcg Nebulization BID   budesonide (PULMICORT) nebulizer solution  0.5 mg Nebulization BID   busPIRone  5 mg Oral BID   carbidopa-levodopa  1 tablet Oral QID   Chlorhexidine Gluconate Cloth  6 each Topical Daily   docusate sodium  100 mg Oral BID   donepezil  5 mg Oral QHS   enoxaparin (LOVENOX) injection  40 mg Subcutaneous Q24H   feeding supplement  1 Container Oral TID BM   feeding supplement  237 mL Oral TID WC   insulin aspart  0-9 Units Subcutaneous Q4H   metoprolol tartrate  50 mg Oral BID   multivitamin with minerals  1 tablet Oral Daily   polyethylene glycol  17 g Oral BID   senna  1 tablet Oral Daily   Continuous Infusions:  methocarbamol (ROBAXIN) IV     piperacillin-tazobactam (ZOSYN)  IV 3.375 g (09/04/22 0459)     LOS: 5 days   Time spent= 35 mins    Cleora Karnik Arsenio Loader, MD [facility]  If 7PM-7AM, please contact night-coverage  09/04/2022, 7:26 AM

## 2022-09-04 NOTE — Progress Notes (Signed)
Progress Note  5 Days Post-Op  Subjective: Tolerating PO. Pain controlled. Having ostomy output. Feeling well. Daughter at bedside.    Objective: Vital signs in last 24 hours: Temp:  [98.7 F (37.1 C)-98.8 F (37.1 C)] 98.8 F (37.1 C) (02/26 0754) Pulse Rate:  [89-111] 111 (02/26 0836) Resp:  [16-18] 18 (02/26 0836) BP: (117-156)/(50-71) 156/71 (02/26 0754) SpO2:  [96 %-99 %] 96 % (02/26 0836) Weight:  [57.7 kg] 57.7 kg (02/25 1700) Last BM Date : 09/03/22  Intake/Output from previous day: 02/25 0701 - 02/26 0700 In: 50  Out: 550 [Urine:150; Drains:50; Stool:350] Intake/Output this shift: No intake/output data recorded.  PE: General: No acute distress, comfortable Lungs: normal work of breathing Abd: soft, nontender, nondistended; midline with staples intact and small amount of SS drainage with penrose from distal wound - no cellulitis. Ostomy with stoma pink and viable - gas and brown stool in pouch. JP with SS fluid in bulb Skin: warm and dry GU: foley draining clear yellow urine   Lab Results:  Recent Labs    09/01/22 1154 09/03/22 0458  WBC 12.0* 11.3*  HGB 9.3* 10.5*  HCT 25.9* 30.8*  PLT 280 367   BMET Recent Labs    09/01/22 1154 09/03/22 0458  NA 136 140  K 3.7 3.7  CL 104 105  CO2 24 22  GLUCOSE 132* 113*  BUN 5* <5*  CREATININE 0.59 0.90  CALCIUM 8.0* 8.2*   PT/INR No results for input(s): "LABPROT", "INR" in the last 72 hours. CMP     Component Value Date/Time   NA 140 09/03/2022 0458   K 3.7 09/03/2022 0458   CL 105 09/03/2022 0458   CO2 22 09/03/2022 0458   GLUCOSE 113 (H) 09/03/2022 0458   BUN <5 (L) 09/03/2022 0458   CREATININE 0.90 09/03/2022 0458   CALCIUM 8.2 (L) 09/03/2022 0458   PROT 7.0 08/29/2022 2210   ALBUMIN 2.7 (L) 08/29/2022 2210   AST 15 08/29/2022 2210   ALT 6 08/29/2022 2210   ALKPHOS 113 08/29/2022 2210   BILITOT 0.1 (L) 08/29/2022 2210   GFRNONAA >60 09/03/2022 0458   Lipase     Component Value  Date/Time   LIPASE 31 08/29/2022 2210       Studies/Results: No results found.  Anti-infectives: Anti-infectives (From admission, onward)    Start     Dose/Rate Route Frequency Ordered Stop   09/01/22 1200  piperacillin-tazobactam (ZOSYN) IVPB 3.375 g        3.375 g 12.5 mL/hr over 240 Minutes Intravenous Every 8 hours 09/01/22 1015     08/31/22 0030  vancomycin (VANCOREADY) IVPB 750 mg/150 mL  Status:  Discontinued        750 mg 150 mL/hr over 60 Minutes Intravenous Every 24 hours 08/30/22 1102 09/01/22 1015   08/30/22 1500  metroNIDAZOLE (FLAGYL) IVPB 500 mg  Status:  Discontinued        500 mg 100 mL/hr over 60 Minutes Intravenous Every 12 hours 08/30/22 1013 09/01/22 1015   08/30/22 1200  ceFEPIme (MAXIPIME) 2 g in sodium chloride 0.9 % 100 mL IVPB  Status:  Discontinued        2 g 200 mL/hr over 30 Minutes Intravenous Every 12 hours 08/30/22 1102 09/01/22 1015   08/29/22 2245  ceFEPIme (MAXIPIME) 2 g in sodium chloride 0.9 % 100 mL IVPB        2 g 200 mL/hr over 30 Minutes Intravenous  Once 08/29/22 2244 08/30/22 0017   08/29/22  2245  metroNIDAZOLE (FLAGYL) IVPB 500 mg        500 mg 100 mL/hr over 60 Minutes Intravenous  Once 08/29/22 2244 08/30/22 0407   08/29/22 2245  vancomycin (VANCOCIN) IVPB 1000 mg/200 mL premix        1,000 mg 200 mL/hr over 60 Minutes Intravenous  Once 08/29/22 2244 08/30/22 0231        Assessment/Plan Stercoral perforation of the abdomen POD5 s/p 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 Dr. Lanny Hurst 2/21  - appreciate TRH care - advance to soft diet, ensure; having gas/stool via colostomy. - continue foley at least 14 days s/p bladder repair- plan for CT cystogram (as outpatient if home) prior to catheter D/C - JP SS, continue - Penrose to come out prior to D/C home - PT/OT, mobilize - NOTHING PER RECTUM   FEN: soft, ensure TID, IVF per CCM ID: cefepime,  flagyl, vanc - tentative plan to stop abx today VTE: lovenox  I reviewed last 24 h vitals and pain scores, last 48 h intake and output, last 24 h labs and trends, and TRH notes .    LOS: 5 days   Check [email or web address] for General Surgery coverage night/weekend/holidays  Page if acute issues. No secure chat available for me given surgeries/clinic/off post call which would lead to a delay in care.  Nadeen Landau, MD [facility]

## 2022-09-04 NOTE — Care Management Important Message (Signed)
Important Message  Patient Details  Name: Alyssa Boyd MRN: [id number] Date of Birth: 08/19/1940   Medicare Important Message Given:  Yes     Orbie Pyo 09/04/2022, 3:26 PM

## 2022-09-05 DIAGNOSIS — K631 Perforation of intestine (nontraumatic): Secondary | ICD-10-CM | POA: Diagnosis not present

## 2022-09-05 LAB — CBC
HCT: 24.3 % — ABNORMAL LOW (ref 36.0–46.0)
Hemoglobin: 8.1 g/dL — ABNORMAL LOW (ref 12.0–15.0)
MCH: 30.8 pg (ref 26.0–34.0)
MCHC: 33.3 g/dL (ref 30.0–36.0)
MCV: 92.4 fL (ref 80.0–100.0)
Platelets: 327 10*3/uL (ref 150–400)
RBC: 2.63 MIL/uL — ABNORMAL LOW (ref 3.87–5.11)
RDW: 14.1 % (ref 11.5–15.5)
WBC: 9.1 10*3/uL (ref 4.0–10.5)
nRBC: 0 % (ref 0.0–0.2)

## 2022-09-05 LAB — BASIC METABOLIC PANEL
Anion gap: 7 (ref 5–15)
BUN: 5 mg/dL — ABNORMAL LOW (ref 8–23)
CO2: 29 mmol/L (ref 22–32)
Calcium: 8.2 mg/dL — ABNORMAL LOW (ref 8.9–10.3)
Chloride: 102 mmol/L (ref 98–111)
Creatinine, Ser: 0.55 mg/dL (ref 0.44–1.00)
GFR, Estimated: >60 mL/min (ref >60)
Glucose, Bld: 169 mg/dL — ABNORMAL HIGH (ref 70–99)
Potassium: 3 mmol/L — ABNORMAL LOW (ref 3.5–5.1)
Sodium: 138 mmol/L (ref 135–145)

## 2022-09-05 LAB — MAGNESIUM: Magnesium: 1.9 mg/dL (ref 1.7–2.4)

## 2022-09-05 LAB — GLUCOSE, CAPILLARY
Glucose-Capillary: 111 mg/dL — ABNORMAL HIGH (ref 70–99)
Glucose-Capillary: 129 mg/dL — ABNORMAL HIGH (ref 70–99)
Glucose-Capillary: 131 mg/dL — ABNORMAL HIGH (ref 70–99)
Glucose-Capillary: 151 mg/dL — ABNORMAL HIGH (ref 70–99)
Glucose-Capillary: 156 mg/dL — ABNORMAL HIGH (ref 70–99)
Glucose-Capillary: 183 mg/dL — ABNORMAL HIGH (ref 70–99)
Glucose-Capillary: 189 mg/dL — ABNORMAL HIGH (ref 70–99)

## 2022-09-05 MED ORDER — POTASSIUM CHLORIDE CRYS ER 20 MEQ PO TBCR
30.0000 meq | EXTENDED_RELEASE_TABLET | ORAL | Status: AC
  Administered 2022-09-05 (×3): 30 meq via ORAL
  Filled 2022-09-05 (×3): qty 1

## 2022-09-05 MED ORDER — MELATONIN 3 MG PO TABS
3.0000 mg | ORAL_TABLET | Freq: Every day | ORAL | Status: DC
  Administered 2022-09-05: 3 mg via ORAL
  Filled 2022-09-05: qty 1

## 2022-09-05 NOTE — Progress Notes (Addendum)
PROGRESS NOTE    Alyssa Boyd  [id number] DOB: 03/04/1941 DOA: 08/29/2022 PCP: Joya Gaskins, FNP    Brief Narrative:   Alyssa Boyd is a 82 y.o. female with past medical history significant for Parkinson disease, essential hypertension, type 2 diabetes mellitus, hyperlipidemia who presented to [facility] ED on 2/20 from [place] with complaint of abdominal pain and rectal bleeding.  Workup in the ED notable for stercoral colitis with evidence of bleeding and anterior rectal perforation with possible fistulous communication within her upper vagina with bilateral hydroureteronephrosis on CT.  She was evaluated by general surgery and taken to the OR for emergent open left colectomy with low anterior resection and creation of a distal transverse colostomy, bladder injury repair, rectovaginal fistula takedown with vaginal repair by Dr. Thermon Leyland on 08/30/2022.  Postoperatively patient remained intubated and she was initially admitted to the intensive care unit.  She was stabilized and transferred to the floor under the hospitalist service on 09/02/2022.  Patient remains with Foley catheter in place with recommendations of at least 14 days postoperative for bladder repair with plan CT cystogram outpatient before consideration of discontinuing catheter.  Patient completed 6-day course of antibiotics per general surgery.  Currently awaiting SNF placement.  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  CT abdomen/pelvis notable for stercoral colitis with evidence of bleeding and anterior rectal perforation with possible fistulous communication within her upper vagina with bilateral hydroureteronephrosis on CT.  She was evaluated by general surgery and taken to the OR for  emergent open left colectomy with low anterior resection and creation of a distal transverse colostomy, bladder injury repair, rectovaginal fistula takedown with vaginal repair by Dr. Thermon Leyland on 08/30/2022.  Patient completed 6-day course of vancomycin, cefepime, Flagyl, Zosyn per general surgery.  Diet advanced to soft diet.  Remains with Foley catheter in place with recommendations to maintain for at least 14 days following bladder repair with plan for CT cystogram outpatient. --General surgery now signed off, okay for discharge from their standpoint -- Soft diet -- Continue to monitor drain output -- Continue to encourage mobilization -- Abdominal dressing changed twice daily -- Pending SNF placement  Acute metabolic encephalopathy likely secondary to hospital-acquired delirium Vitamin B12 1220, ammonia level 29, TSH 3.755, within normal limits.  Suspect sundowning due to hospital-acquired delirium. --Delirium precautions --Get up during the day --Encourage a familiar face to remain present throughout the day --Keep blinds open and lights on during daylight hours --Minimize the use of opioids/benzodiazepines --Melatonin 3 mg p.o. nightly  Hypokalemia Potassium 3.0, magnesium 1.9.  Will replete potassium today. --BMP in a.m.  Hx COPD Home regimen includes Advair, Singulair, Spiriva. -- Brovana neb twice daily -- Pulmicort neb twice daily -- DuoNeb every 4 hours as needed wheezing/shortness of breath  Anemia of critical illness Hemoglobin stable, 8.1.  No evidence for overt bleeding.  Hypophosphatemia Stable  Essential hypertension --Metoprolol tartrate 50 mg p.o. twice daily -- Irbesartan 75 mg p.o. daily  Hyperlipidemia -- Atorvastatin 20 mg p.o. daily  Type 2 diabetes mellitus Hemoglobin A1c 5.8.  Diet controlled at baseline.   DVT prophylaxis: enoxaparin (LOVENOX) injection 40 mg Start: 08/31/22 2200 SCDs Start: 08/30/22 0448    Code Status: Full Code Family  Communication: Updated daughter present at bedside this morning  Disposition Plan:  Level of  care: Med-Surg Status is: Inpatient Remains inpatient appropriate because: Medically stable for discharge to SNF once bed available    Consultants:  General surgery  Procedures:  Open left colectomy with low anterior resection and creation of end distal transverse colostomy, bladder injury repair, rectovaginal fistula takedown with vaginal repair, Dr. Thermon Leyland; 08/30/2022  Antimicrobials:  Vancomycin 2/20 - 2/22 Cefepime 2/20 - 2/22 Metronidazole 2/20 - 2/22 Zosyn 2/23 - 2/25   Subjective: Patient seen examined bedside, resting currently.  Lying in bed.  Daughter present.  Reports abdominal pain much improved today.  No specific complaints.  Daughter concerned about hallucinations at nighttime.  With her that this is likely secondary to her prolonged hospitalization and events during hospitalization likely related to hospital-acquired delirium.  Seen by general surgery this morning, okay for discharge from their standpoint.  Pending SNF placement.  Medically stable for discharge once SNF bed available.  Objective: Vitals:   09/04/22 2000 09/05/22 0500 09/05/22 0610 09/05/22 0746  BP: (!) 124/47  (!) 118/49 (!) 129/52  Pulse: (!) 102  85 92  Resp: '18  17 18  '$ Temp: 98.6 F (37 C)  99.7 F (37.6 C) 99.6 F (37.6 C)  TempSrc: Oral  Oral Oral  SpO2: 98%  94% 97%  Weight:  55.8 kg    Height:        Intake/Output Summary (Last 24 hours) at 09/05/2022 1004 Last data filed at 09/05/2022 0500 Gross per 24 hour  Intake 150 ml  Output 2005 ml  Net -1855 ml   Filed Weights   09/03/22 1700 09/04/22 1100 09/05/22 0500  Weight: 57.7 kg 56.8 kg 55.8 kg    Examination:  Physical Exam: GEN: NAD, alert HEENT: NCAT, PERRL, EOMI, sclera clear, MMM PULM: CTAB w/o wheezes/crackles, normal respiratory effort, on room air CV: RRR w/o M/G/R GI: abd soft, NTND, colostomy noted with brown stool  and air noted in collection bag, JP drain noted, midline abdominal surgical wound noted with dressing in place, clean/dry/intact, NABS MSK: no peripheral edema, moves all extremities independently NEURO: No focal deficits PSYCH: normal mood/affect Integumentary: dry/intact, no rashes or wounds    Data Reviewed: I have personally reviewed following labs and imaging studies  CBC: Recent Labs  Lab 08/29/22 2210 08/30/22 0039 08/30/22 1729 08/31/22 0530 09/01/22 1154 09/03/22 0458 09/05/22 0250  WBC 12.5*   < > 13.5* 13.5* 12.0* 11.3* 9.1  NEUTROABS 10.1*  --   --   --   --   --   --   HGB 10.0*   < > 10.2* 9.3* 9.3* 10.5* 8.1*  HCT 30.0*   < > 29.6* 26.8* 25.9* 30.8* 24.3*  MCV 90.1   < > 87.8 89.6 90.6 92.2 92.4  PLT 434*   < > 253 227 280 367 327   < > = values in this interval not displayed.   Basic Metabolic Panel: Recent Labs  Lab 08/30/22 2235 08/31/22 0530 09/01/22 1154 09/03/22 0458 09/05/22 0250  NA 138 138 136 140 138  K 3.0* 3.9 3.7 3.7 3.0*  CL 106 108 104 105 102  CO2 '24 24 24 22 29  '$ GLUCOSE 232* 168* 132* 113* 169*  BUN 5* <5* 5* <5* 5*  CREATININE 0.65 0.65 0.59 0.90 0.55  CALCIUM 8.3* 8.2* 8.0* 8.2* 8.2*  MG  --  1.6* 2.1 2.0 1.9  PHOS  --  2.0* 2.2* 3.2  --    GFR: Estimated Creatinine Clearance: 49.4 mL/min (by C-G formula based on SCr of  0.55 mg/dL). Liver Function Tests: Recent Labs  Lab 08/29/22 2210  AST 15  ALT 6  ALKPHOS 113  BILITOT 0.1*  PROT 7.0  ALBUMIN 2.7*   Recent Labs  Lab 08/29/22 2210  LIPASE 31   Recent Labs  Lab 09/04/22 1137  AMMONIA 29   Coagulation Profile: Recent Labs  Lab 08/29/22 2210  INR 1.1   Cardiac Enzymes: No results for input(s): "CKTOTAL", "CKMB", "CKMBINDEX", "TROPONINI" in the last 168 hours. BNP (last 3 results) No results for input(s): "PROBNP" in the last 8760 hours. HbA1C: No results for input(s): "HGBA1C" in the last 72 hours. CBG: Recent Labs  Lab 09/04/22 1708 09/04/22 2102  09/05/22 0026 09/05/22 0438 09/05/22 0747  GLUCAP 231* 221* 183* 131* 151*   Lipid Profile: No results for input(s): "CHOL", "HDL", "LDLCALC", "TRIG", "CHOLHDL", "LDLDIRECT" in the last 72 hours. Thyroid Function Tests: Recent Labs    09/04/22 1137  TSH 3.755   Anemia Panel: Recent Labs    09/04/22 1137  VITAMINB12 1,220*  FOLATE 19.1   Sepsis Labs: Recent Labs  Lab 08/29/22 2210 08/30/22 0029 08/31/22 0530  LATICACIDVEN 2.3* 2.4* 0.9    Recent Results (from the past 240 hour(s))  Urine Culture (for pregnant, neutropenic or urologic patients or patients with an indwelling urinary catheter)     Status: None   Collection Time: 08/29/22 10:44 PM   Specimen: Urine, Clean Catch  Result Value Ref Range Status   Specimen Description URINE, CLEAN CATCH  Final   Special Requests NONE  Final   Culture   Final    NO GROWTH Performed at [facility], [place] [address], [place], [place] [address]    Report Status 08/31/2022 FINAL  Final  Resp panel by RT-PCR (RSV, Flu A&B, Covid) Anterior Nasal Swab     Status: None   Collection Time: 08/30/22 12:30 AM   Specimen: Anterior Nasal Swab  Result Value Ref Range Status   SARS Coronavirus 2 by RT PCR NEGATIVE NEGATIVE Final   Influenza A by PCR NEGATIVE NEGATIVE Final   Influenza B by PCR NEGATIVE NEGATIVE Final    Comment: (NOTE) The Xpert Xpress SARS-CoV-2/FLU/RSV plus assay is intended as an aid in the diagnosis of influenza from Nasopharyngeal swab specimens and should not be used as a sole basis for treatment. Nasal washings and aspirates are unacceptable for Xpert Xpress SARS-CoV-2/FLU/RSV testing.  Fact Sheet for Patients: EntrepreneurPulse.com.au  Fact Sheet for Healthcare Providers: IncredibleEmployment.be  This test is not yet approved or cleared by the [place] FDA and has been authorized for detection and/or diagnosis of SARS-CoV-2 by FDA under an Emergency Use  Authorization (EUA). This EUA will remain in effect (meaning this test can be used) for the duration of the COVID-19 declaration under Section 564(b)(1) of the Act, 21 U.S.C. section 360bbb-3(b)(1), unless the authorization is terminated or revoked.     Resp Syncytial Virus by PCR NEGATIVE NEGATIVE Final    Comment: (NOTE) Fact Sheet for Patients: EntrepreneurPulse.com.au  Fact Sheet for Healthcare Providers: IncredibleEmployment.be  This test is not yet approved or cleared by the [place] FDA and has been authorized for detection and/or diagnosis of SARS-CoV-2 by FDA under an Emergency Use Authorization (EUA). This EUA will remain in effect (meaning this test can be used) for the duration of the COVID-19 declaration under Section 564(b)(1) of the Act, 21 U.S.C. section 360bbb-3(b)(1), unless the authorization is terminated or revoked.  Performed at [facility], [place] [address],  [place], [place] [address]   MRSA Next Gen by PCR, Nasal     Status: None   Collection Time: 08/30/22 10:08 AM   Specimen: Nasal Mucosa; Nasal Swab  Result Value Ref Range Status   MRSA by PCR Next Gen NOT DETECTED NOT DETECTED Final    Comment: (NOTE) The GeneXpert MRSA Assay (FDA approved for NASAL specimens only), is one component of a comprehensive MRSA colonization surveillance program. It is not intended to diagnose MRSA infection nor to guide or monitor treatment for MRSA infections. Test performance is not FDA approved in patients less than 62 years old. Performed at [facility], [place] [address], [place], [place] [address]   Blood Culture (routine x 2)     Status: None   Collection Time: 08/30/22 12:11 PM   Specimen: BLOOD RIGHT HAND  Result Value Ref Range Status   Specimen Description BLOOD RIGHT HAND  Final   Special Requests   Final    BOTTLES DRAWN AEROBIC AND ANAEROBIC Blood Culture results may not be optimal due to an inadequate  volume of blood received in culture bottles   Culture   Final    NO GROWTH 5 DAYS Performed at [facility], [place] [address] [place], [place] [address]    Report Status 09/04/2022 FINAL  Final  Blood Culture (routine x 2)     Status: None   Collection Time: 08/30/22 12:19 PM   Specimen: BLOOD RIGHT HAND  Result Value Ref Range Status   Specimen Description BLOOD RIGHT HAND  Final   Special Requests   Final    BOTTLES DRAWN AEROBIC AND ANAEROBIC Blood Culture adequate volume   Culture   Final    NO GROWTH 5 DAYS Performed at [facility], [place] [address] [place], [place] [address]    Report Status 09/04/2022 FINAL  Final         Radiology Studies: No results found.      Scheduled Meds:  sodium chloride   Intravenous Once   acetaminophen  1,000 mg Oral Q8H   arformoterol  15 mcg Nebulization BID   atorvastatin  20 mg Oral Daily   budesonide (PULMICORT) nebulizer solution  0.5 mg Nebulization BID   busPIRone  5 mg Oral BID   carbidopa-levodopa  1 tablet Oral QID   Chlorhexidine Gluconate Cloth  6 each Topical Daily   docusate sodium  100 mg Oral BID   donepezil  5 mg Oral QHS   enoxaparin (LOVENOX) injection  40 mg Subcutaneous Q24H   feeding supplement  1 Container Oral TID BM   feeding supplement  237 mL Oral TID WC   insulin aspart  0-9 Units Subcutaneous Q4H   irbesartan  75 mg Oral Daily   metoprolol tartrate  50 mg Oral BID   multivitamin with minerals  1 tablet Oral Daily   polyethylene glycol  17 g Oral BID   potassium chloride  30 mEq Oral Q3H   rOPINIRole  8 mg Oral QHS   senna  1 tablet Oral Daily   Continuous Infusions:  methocarbamol (ROBAXIN) IV       LOS: 6 days    Time spent: 48 minutes spent on chart review, discussion with nursing staff, consultants, updating family and interview/physical exam; more than 50% of that time was spent in counseling and/or coordination of care.    Meshia Rau J [place] DO [facility] Available  via Epic secure chat 7am-7pm After these hours, please refer to  coverage provider listed on [email or web address] 09/05/2022, 10:04 AM

## 2022-09-05 NOTE — TOC Progression Note (Signed)
Transition of Care [facility]) - Progression Note    Patient Details  Name: Avina Coldren MRN: [id number] Date of Birth: July 16, 1940  Transition of Care [facility] CM/SW [place], [place] Phone Number: 09/05/2022, 1:18 PM  Clinical Narrative:     CSW called pt's daughter Sonia Baller for SNF choice. Daughter states she has planned a tour with [employer] for tomorrow morning. She stated she spoke with attended and was under the impression pt wouldn't be discharged for a few more days.   CSW discussed with attending and informed that pt likely stable for DC as early as tomorrow. CSW spoke with daughter again over the phone and informed her of this. Reviewed SNF offers and medicare star ratings with her verbally. She plans to reach out to a few more facilities on the list to ask questions to before making decision on SNF.  TOC will continue to follow.   Expected Discharge Plan: [place] Barriers to Discharge: Continued Medical Work up  Expected Discharge Plan and Services In-house Referral: Clinical Social Work     Living arrangements for the past 2 months: [place]                                       Social Determinants of Health (SDOH) Interventions    Readmission Risk Interventions     No data to display

## 2022-09-05 NOTE — Progress Notes (Signed)
Progress Note  6 Days Post-Op  Subjective: No new complaints. Pain well controlled. Tolerating diet - does not like the taste of ensure but drinking boost  Daughter at bedside.    Objective: Vital signs in last 24 hours: Temp:  [98.6 F (37 C)-99.7 F (37.6 C)] 99.6 F (37.6 C) (02/27 0746) Pulse Rate:  [85-111] 92 (02/27 0746) Resp:  [17-18] 18 (02/27 0746) BP: (118-139)/(47-56) 129/52 (02/27 0746) SpO2:  [94 %-99 %] 97 % (02/27 0746) Weight:  [55.8 kg-56.8 kg] 55.8 kg (02/27 0500) Last BM Date : 09/04/22 (colostomy)  Intake/Output from previous day: 02/26 0701 - 02/27 0700 In: 150 [P.O.:120] Out: 2005 [Urine:900; Drains:5; Stool:1100] Intake/Output this shift: No intake/output data recorded.  PE: General: No acute distress, comfortable Lungs: normal work of breathing Abd: soft, appropriately TTP, nondistended; midline with staples intact and small amount of SS drainage with penrose from distal wound - no cellulitis. Penrose removed. Ostomy with stoma pink and viable - gas and brown stool in pouch. JP with SS fluid in bulb Skin: warm and dry GU: foley draining clear yellow urine   Lab Results:  Recent Labs    09/03/22 0458 09/05/22 0250  WBC 11.3* 9.1  HGB 10.5* 8.1*  HCT 30.8* 24.3*  PLT 367 327    BMET Recent Labs    09/03/22 0458 09/05/22 0250  NA 140 138  K 3.7 3.0*  CL 105 102  CO2 22 29  GLUCOSE 113* 169*  BUN <5* 5*  CREATININE 0.90 0.55  CALCIUM 8.2* 8.2*    PT/INR No results for input(s): "LABPROT", "INR" in the last 72 hours. CMP     Component Value Date/Time   NA 138 09/05/2022 0250   K 3.0 (L) 09/05/2022 0250   CL 102 09/05/2022 0250   CO2 29 09/05/2022 0250   GLUCOSE 169 (H) 09/05/2022 0250   BUN 5 (L) 09/05/2022 0250   CREATININE 0.55 09/05/2022 0250   CALCIUM 8.2 (L) 09/05/2022 0250   PROT 7.0 08/29/2022 2210   ALBUMIN 2.7 (L) 08/29/2022 2210   AST 15 08/29/2022 2210   ALT 6 08/29/2022 2210   ALKPHOS 113 08/29/2022  2210   BILITOT 0.1 (L) 08/29/2022 2210   GFRNONAA >60 09/05/2022 0250   Lipase     Component Value Date/Time   LIPASE 31 08/29/2022 2210       Studies/Results: No results found.  Anti-infectives: Anti-infectives (From admission, onward)    Start     Dose/Rate Route Frequency Ordered Stop   09/01/22 1200  piperacillin-tazobactam (ZOSYN) IVPB 3.375 g  Status:  Discontinued        3.375 g 12.5 mL/hr over 240 Minutes Intravenous Every 8 hours 09/01/22 1015 09/04/22 1111   08/31/22 0030  vancomycin (VANCOREADY) IVPB 750 mg/150 mL  Status:  Discontinued        750 mg 150 mL/hr over 60 Minutes Intravenous Every 24 hours 08/30/22 1102 09/01/22 1015   08/30/22 1500  metroNIDAZOLE (FLAGYL) IVPB 500 mg  Status:  Discontinued        500 mg 100 mL/hr over 60 Minutes Intravenous Every 12 hours 08/30/22 1013 09/01/22 1015   08/30/22 1200  ceFEPIme (MAXIPIME) 2 g in sodium chloride 0.9 % 100 mL IVPB  Status:  Discontinued        2 g 200 mL/hr over 30 Minutes Intravenous Every 12 hours 08/30/22 1102 09/01/22 1015   08/29/22 2245  ceFEPIme (MAXIPIME) 2 g in sodium chloride 0.9 % 100 mL IVPB  2 g 200 mL/hr over 30 Minutes Intravenous  Once 08/29/22 2244 08/30/22 0017   08/29/22 2245  metroNIDAZOLE (FLAGYL) IVPB 500 mg        500 mg 100 mL/hr over 60 Minutes Intravenous  Once 08/29/22 2244 08/30/22 0407   08/29/22 2245  vancomycin (VANCOCIN) IVPB 1000 mg/200 mL premix        1,000 mg 200 mL/hr over 60 Minutes Intravenous  Once 08/29/22 2244 08/30/22 0231        Assessment/Plan Stercoral perforation of the abdomen POD6 s/p 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 Dr. Lanny Hurst 2/21  - appreciate TRH care - advance to soft diet, ensure; having gas/stool via colostomy. - continue foley at least 14 days s/p bladder repair- plan for CT cystogram (as outpatient if home) prior to catheter D/C. - JP SS,  continue - Penrose out - PT/OT, mobilize - NOTHING PER RECTUM   Medically stable for dc from surgical perspective  FEN: soft, ensure/breeze TID, IVF per CCM ID: cefepime, flagyl, vanc, zosyn. Abx completed. WBC normal VTE: lovenox  I reviewed last 24 h vitals and pain scores, last 48 h intake and output, last 24 h labs and trends, and TRH notes .    LOS: 6 days   [place] [facility] 09/05/2022, 8:10 AM Please see Amion for pager number during day hours 7:00am-4:30pm

## 2022-09-05 NOTE — Care Plan (Signed)
Patient AxOx4, forgetful but easily reoriented. Daughter visited in am and husband came in evening and was updated. Patient was helped to get back in bed with Filiberto Pinks after PT placed her in chair. Patient complained by mild back pain and was successfully treated with tylenol. She was helped to eat lunch (50%) and she ate diner (70%) without help after tray was placed in front of her. Dressing on abdomen and IV site were changed. Patient was comfortable during the day in most part, except mild back pain and abdominal tenderness. VS WNL

## 2022-09-05 NOTE — Progress Notes (Signed)
Nutrition Follow-up  DOCUMENTATION CODES:   Not applicable  INTERVENTION:  - Continue current diet order.   - Continue Ensure Enlive po TID, each supplement provides 350 kcal and 20 grams of protein.   - Discontinue Boost Breeze po TID, each supplement provides 250 kcal and 9 grams of protein  NUTRITION DIAGNOSIS:   Inadequate oral intake related to altered GI function as evidenced by other (comment) (CL diet). - Improving, diet advanced.   GOAL:   Patient will meet greater than or equal to 90% of their needs - Not met, continue as goal.   MONITOR:   PO intake, Diet advancement  REASON FOR ASSESSMENT:   Other (Comment) (RD screen)    ASSESSMENT:   82 y.o. female admits related to rectal bleeding and abdominal pain. PMH includes: AMS, acute metabolic encephalopathy, Parkinson's Disease, HTN, T2DM, aneurysm of vertebral artery, foot fracture, hypokalemia, hypomagnesemia, COPD, HLD. Pt is currently receiving medical management  related to stercoral perforation with rectovaginal fistula s/p  emergement surgical repair on 2/21 (open left colectomy with low anterior resection and creation of distal transverse colostomy, bladder injury repair, rectovaginal fistula takedown with vaginal repair).  Meds reviewed: lipitor, colace, sliding scale insulin, miralax, senokot. Labs reviewed: K low. FS Glucose 131-221 mg/dL.   The pt reports that she has been eating fair. No intakes documented over the past 4 days. Pt reports that she has been drinking Ensure shakes. Pt is planning to discharge to SNF. RD will discontinue Boost Breeze now that pt has been advanced to a soft diet. RD will continue to monitor PO intakes.   Diet Order:   Diet Order             DIET SOFT Room service appropriate? Yes; Fluid consistency: Thin  Diet effective now                   EDUCATION NEEDS:   Not appropriate for education at this time  Skin:  Skin Assessment: Skin Integrity Issues: Skin  Integrity Issues:: Incisions Incisions: abdomen  Last BM:  2/26 - colostomy  Height:   Ht Readings from Last 1 Encounters:  08/29/22 '5\' 5"'$  (1.651 m)    Weight:   Wt Readings from Last 1 Encounters:  09/05/22 55.8 kg    Ideal Body Weight:     BMI:  Body mass index is 20.47 kg/m.  Estimated Nutritional Needs:   Kcal:  1800-2100 kcals  Protein:  90-105 gm  Fluid:  >/= 1.8 L  Thalia Bloodgood, RD, LDN, CNSC.

## 2022-09-05 NOTE — Progress Notes (Signed)
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 PMH includes PD, HTN, asthma, DM, dementia, OSA, RLS.    PT Comments    Progressing towards acute PT goals. Tolerated bed mobility training (mod assist), Sit<>stand (mod assist), and stand pivot transfer to recliner (max assist). 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 Demonstrates significant posterior lean, suspect due to prolonged period in bed. Eager to work with PT and sit in [place] today. Patient will continue to benefit from skilled physical therapy services to further improve independence with functional mobility.    Recommendations for follow up therapy are one component of a multi-disciplinary discharge planning process, led by the attending physician.  Recommendations may be updated based on patient status, additional functional criteria and insurance authorization.  Follow Up Recommendations  Skilled nursing-short term rehab (<3 hours/day) Can patient physically be transported by private vehicle: No   Assistance Recommended at Discharge Frequent or constant Supervision/Assistance  Patient can return home with the following Two people to help with walking and/or transfers;Two people to help with bathing/dressing/bathroom;Direct supervision/assist for medications management;Direct supervision/assist for financial management;Assistance with cooking/housework;Assist for transportation   Equipment Recommendations  None  recommended by PT    Recommendations for Other Services       Precautions / Restrictions Precautions Precautions: Fall;Other (comment) Precaution Comments: watch HR, JP drain, ostomy Restrictions Weight Bearing Restrictions: No Other Position/Activity Restrictions: Prior injury/contracture of Lt ankle     Mobility  Bed Mobility Overal bed mobility: Needs Assistance Bed Mobility: Supine to Sit     Supine to sit: Mod assist, HOB elevated     General bed mobility comments: Mod assist for trunk and LE support to facilitate out of bed. Demonstrates significant posterior lean.    Transfers Overall transfer level: Needs assistance Equipment used: Rolling walker (2 wheels), None Transfers: Bed to chair/wheelchair/BSC, Sit to/from Stand Sit to Stand: Mod assist Stand pivot transfers: Max assist         General transfer comment: Mod assist for boost to stand from bed, needs heavy assist to lean forward into RW, cues for hand placement, Lt foot blocked to prevent from inverting excessively due to apparent contracture. Max assist for pivot on Rt foot to recliner. Cues for weight shift to unload LLE, and block from inverting Lt ankle.    Ambulation/Gait                   Stairs             Wheelchair Mobility    Modified Rankin (Stroke Patients Only)       Balance Overall balance assessment: Needs assistance Sitting-balance support: Feet supported, Single extremity supported Sitting balance-Leahy Scale: Zero Sitting balance - Comments: Sat EOB, needed frequent assist due to posterior instability. Progressed to short periods <30 seconds unsupported by therapist, but needed UE support. Postural control: Posterior lean Standing balance support: Bilateral upper extremity supported Standing balance-Leahy Scale: Poor Standing balance comment: Leans posteriorly. Requires UE support.  Cognition Arousal/Alertness:  Awake/alert Behavior During Therapy: WFL for tasks assessed/performed Overall Cognitive Status:  (NT)                                 General Comments: Able to recall prior mobility difficulties, reports lt ankle injury preventing from ambulating but has desire to learn to walk again. States she was at w/c level prior and needed assist to transfer. Aware she is going to rehab.        Exercises      General Comments        Pertinent Vitals/Pain Pain Assessment Pain Assessment: Faces Faces Pain Scale: Hurts a little bit Pain Location: Feet Pain Descriptors / Indicators: Sore Pain Intervention(s): Monitored during session, Repositioned    Home Living                          Prior Function            PT Goals (current goals can now be found in the care plan section) Acute Rehab PT Goals Patient Stated Goal: to be able to get myself to the [facility] PT Goal Formulation: With patient/family Time For Goal Achievement: 09/15/22 Potential to Achieve Goals: Fair Progress towards PT goals: Progressing toward goals    Frequency    Min 2X/week      PT Plan Current plan remains appropriate    Co-evaluation              AM-PAC PT "6 Clicks" Mobility   Outcome Measure  Help needed turning from your back to your side while in a flat bed without using bedrails?: A Lot Help needed moving from lying on your back to sitting on the side of a flat bed without using bedrails?: A Lot Help needed moving to and from a bed to a chair (including a wheelchair)?: Total Help needed standing up from a chair using your arms (e.g., wheelchair or bedside chair)?: A Lot Help needed to walk in hospital room?: Total Help needed climbing 3-5 steps with a railing? : Total 6 Click Score: 9    End of Session Equipment Utilized During Treatment: Gait belt Activity Tolerance: Patient tolerated treatment well Patient left: with call bell/phone within reach;in chair;with  chair alarm set Nurse Communication: Mobility status;Need for lift equipment (Secure chat to use [place], manage Lt ankle inversion) PT Visit Diagnosis: Pain;Muscle weakness (generalized) (M62.81);Unsteadiness on feet (R26.81);Difficulty in walking, not elsewhere classified (R26.2) Pain - Right/Left:  (BIL) Pain - part of body:  (Feet)     Time: [id number] PT Time Calculation (min) (ACUTE ONLY): 18 min  Charges:  $Therapeutic Activity: 8-22 mins                     Candie Mile, PT, DPT Physical Therapist [facility] [place]    Ellouise Newer 09/05/2022, 2:49 PM

## 2022-09-06 DIAGNOSIS — K631 Perforation of intestine (nontraumatic): Secondary | ICD-10-CM | POA: Diagnosis not present

## 2022-09-06 LAB — BASIC METABOLIC PANEL
Anion gap: 7 (ref 5–15)
BUN: <5 mg/dL — ABNORMAL LOW (ref 8–23)
CO2: 25 mmol/L (ref 22–32)
Calcium: 8.5 mg/dL — ABNORMAL LOW (ref 8.9–10.3)
Chloride: 104 mmol/L (ref 98–111)
Creatinine, Ser: 0.54 mg/dL (ref 0.44–1.00)
GFR, Estimated: >60 mL/min (ref >60)
Glucose, Bld: 127 mg/dL — ABNORMAL HIGH (ref 70–99)
Potassium: 4.2 mmol/L (ref 3.5–5.1)
Sodium: 136 mmol/L (ref 135–145)

## 2022-09-06 LAB — MAGNESIUM: Magnesium: 1.8 mg/dL (ref 1.7–2.4)

## 2022-09-06 LAB — GLUCOSE, CAPILLARY
Glucose-Capillary: 108 mg/dL — ABNORMAL HIGH (ref 70–99)
Glucose-Capillary: 112 mg/dL — ABNORMAL HIGH (ref 70–99)
Glucose-Capillary: 154 mg/dL — ABNORMAL HIGH (ref 70–99)

## 2022-09-06 MED ORDER — SENNA 8.6 MG PO TABS: 1.0000 | ORAL_TABLET | Freq: Every day | ORAL | 0 refills | Status: DC

## 2022-09-06 MED ORDER — DOCUSATE SODIUM 100 MG PO CAPS: 100.0000 mg | ORAL_CAPSULE | Freq: Two times a day (BID) | ORAL | 0 refills | Status: DC

## 2022-09-06 MED ORDER — TRAMADOL HCL 50 MG PO TABS: 50.0000 mg | ORAL_TABLET | Freq: Four times a day (QID) | ORAL | 0 refills | Status: DC | PRN

## 2022-09-06 MED ORDER — POLYETHYLENE GLYCOL 3350 17 G PO PACK: 17.0000 g | PACK | Freq: Two times a day (BID) | ORAL | 0 refills | Status: DC

## 2022-09-06 NOTE — Consult Note (Signed)
[place] Nurse ostomy follow up Stoma type/location:  LMQ transverse colostomy Stomal assessment/size: 2" red edematous  Peristomal assessment: not assessed   No family in room at this time for education.  Ostomy pouch burped but is intact at this time.    Patient also has no supplies in room.  Spoke with bedside nurse and asked secretary to order supplies for patient as she is likely being discharged to SNF today.  Treatment options for stomal/peristomal skin: n/a Output moderate amount of soft brown stool  Ostomy pouching: 2pc. 2 3/4"  Education provided: no family in room, patient confused.  Enrolled patient in [facility] program: Yes, sent to daughters address   WOC will continue to follow patient for further ostomy and support if remains inpatient.  Supplies ordered again at this visit.    Thank you,    Margeart Allender MSN, RN-BC, [employer]

## 2022-09-06 NOTE — Progress Notes (Signed)
Order to discontinue JP/Blake drain. Drain removed without difficulty, pt tolerated well. Covered drain site with folded 4x4s and secured with hypafix tape.  Pt and daughter (at bedside) educated and explained procedure to them. Site without any drainage at this time.

## 2022-09-06 NOTE — Discharge Summary (Signed)
Physician Discharge Summary  Alyssa Boyd [id number] DOB: 10-30-1940 DOA: 08/29/2022  PCP: Joya Gaskins, FNP  Admit date: 08/29/2022 Discharge date: 09/06/2022  Admitted From: [facility] Disposition: [facility]  Recommendations for Outpatient Follow-up:  Follow up with PCP in 1-2 weeks Follow-up with general surgery, Dr. Thermon Leyland on 09/15/2022 Patient has CT cystogram scheduled on 09/13/2022 at [facility]  Discharge Condition: Stable CODE STATUS: Full code Diet recommendation: Soft diet  History of present illness:  Alyssa Boyd is a 82 y.o. female with past medical history significant for Parkinson disease, essential hypertension, type 2 diabetes mellitus, hyperlipidemia who presented to [facility] ED on 2/20 from [facility] facility with complaint of abdominal pain and rectal bleeding.  Workup in the ED notable for stercoral colitis with evidence of bleeding and anterior rectal perforation with possible fistulous communication within her upper vagina with bilateral hydroureteronephrosis on CT.  She was evaluated by general surgery and taken to the OR for emergent open left colectomy with low anterior resection and creation of a distal transverse colostomy, bladder injury repair, rectovaginal fistula takedown with vaginal repair by Dr. Thermon Leyland on 08/30/2022.  Postoperatively patient remained intubated and she was initially admitted to the intensive care unit.  She was stabilized and transferred to the floor under the hospitalist service on 09/02/2022.  Patient remains with Foley catheter in place with recommendations of at least 14 days postoperative for bladder repair with plan CT cystogram outpatient before consideration of discontinuing catheter.  Patient completed 6-day course of antibiotics per general surgery.   Discharging to SNF for further rehabilitation.  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  CT abdomen/pelvis notable for stercoral colitis with evidence of bleeding and anterior rectal perforation with possible fistulous communication within her upper vagina with bilateral hydroureteronephrosis on CT.  She was evaluated by general surgery and taken to the OR for emergent open left colectomy with low anterior resection and creation of a distal transverse colostomy, bladder injury repair, rectovaginal fistula takedown with vaginal repair by Dr. Thermon Leyland on 08/30/2022.  Patient completed 6-day course of vancomycin, cefepime, Flagyl, Zosyn per general surgery.  Diet advanced to soft diet.  Remains with Foley catheter in place with recommendations to maintain for at least 14 days following bladder repair with plan for CT cystogram outpatient; general surgery states scheduled on 09/13/2022.  Continue soft diet.  Outpatient follow-up with general surgery on 09/15/2022.   Acute metabolic encephalopathy likely secondary to hospital-acquired delirium Vitamin B12 1220, ammonia level 29, TSH 3.755, within normal limits.  Suspect sundowning due to hospital-acquired delirium.   Hypokalemia Repleted.  Potassium 4.2 at time of discharge.   Hx COPD Home regimen includes Advair, Singulair, Spiriva.   Anemia of critical illness Hemoglobin stable, 8.1.  No evidence for overt bleeding.   Hypophosphatemia Stable   Essential hypertension Continue metoprolol tartrate 50 mg p.o. twice daily, telmisartan 40 mg p.o. daily   Hyperlipidemia Atorvastatin 20 mg p.o. daily   Type 2 diabetes mellitus Hemoglobin A1c 5.8.  Diet controlled at baseline.  Discharge Diagnoses:  Principal Problem:   Perforation bowel [facility])    Discharge Instructions  Discharge Instructions     Call MD for:  difficulty breathing, headache or visual  disturbances   Complete by: As directed    Call MD for:  extreme fatigue  Complete by: As directed    Call MD for:  persistant dizziness or light-headedness   Complete by: As directed    Call MD for:  persistant nausea and vomiting   Complete by: As directed    Call MD for:  severe uncontrolled pain   Complete by: As directed    Call MD for:  temperature >100.4   Complete by: As directed    Continue foley catheter   Complete by: As directed    Diet - low sodium heart healthy   Complete by: As directed    Increase activity slowly   Complete by: As directed    No wound care   Complete by: As directed       Allergies as of 09/06/2022       Reactions   Shellfish Allergy Hives, Itching, Nausea Only        Medication List     STOP taking these medications    lactulose 20 g packet Commonly known as: CEPHULAC   venlafaxine 75 MG tablet Commonly known as: EFFEXOR       TAKE these medications    albuterol 108 (90 Base) MCG/ACT inhaler Commonly known as: VENTOLIN HFA Inhale 2 puffs into the lungs every 6 (six) hours as needed for wheezing or shortness of breath.   aspirin EC 81 MG tablet Take 81 mg by mouth daily. Swallow whole.   atorvastatin 20 MG tablet Commonly known as: LIPITOR Take 20 mg by mouth daily.   busPIRone 5 MG tablet Commonly known as: BUSPAR Take 5 mg by mouth 2 (two) times daily.   CALCIUM-VITAMIN D3 PO Take 5 mg by mouth daily.   carbidopa-levodopa 25-100 MG tablet Commonly known as: SINEMET IR Take 1 tablet by mouth 4 (four) times daily.   docusate sodium 100 MG capsule Commonly known as: COLACE Take 1 capsule (100 mg total) by mouth 2 (two) times daily.   donepezil 5 MG tablet Commonly known as: ARICEPT Take 5 mg by mouth at bedtime.   fluticasone 50 MCG/ACT nasal spray Commonly known as: FLONASE Place 2 sprays into both nostrils at bedtime.   fluticasone-salmeterol 250-50 MCG/ACT Aepb Commonly known as: ADVAIR Inhale 1 puff  into the lungs in the morning and at bedtime.   furosemide 20 MG tablet Commonly known as: LASIX Take 20 mg by mouth 2 (two) times daily.   glycerin adult 2 g suppository Place 1 suppository rectally as needed for constipation.   loratadine 10 MG tablet Commonly known as: CLARITIN Take 10 mg by mouth daily.   metoprolol tartrate 50 MG tablet Commonly known as: LOPRESSOR Take 50 mg by mouth 2 (two) times daily. No mg listed on mar   montelukast 10 MG tablet Commonly known as: SINGULAIR Take 10 mg by mouth at bedtime.   MULTI COMPLETE PO Take 1 tablet by mouth daily.   polyethylene glycol 17 g packet Commonly known as: MIRALAX / GLYCOLAX Take 17 g by mouth 2 (two) times daily.   psyllium 0.52 g capsule Commonly known as: REGULOID Take 0.52 g by mouth at bedtime.   rOPINIRole 8 MG 24 hr tablet Commonly known as: REQUIP XL Take 8 mg by mouth at bedtime.   senna 8.6 MG Tabs tablet Commonly known as: SENOKOT Take 1 tablet (8.6 mg total) by mouth daily. Start taking on: September 07, 2022 What changed: when to take this   Spiriva Respimat 2.5 MCG/ACT Aers Generic drug: Tiotropium Bromide Monohydrate Inhale 2 puffs into the lungs daily.  telmisartan 40 MG tablet Commonly known as: MICARDIS Take 40 mg by mouth daily.   traMADol 50 MG tablet Commonly known as: ULTRAM Take 1 tablet (50 mg total) by mouth every 6 (six) hours as needed for moderate pain.   Vitamin D3 50 MCG (2000 UT) Chew Generic drug: Cholecalciferol Chew 50 mcg by mouth daily.        Follow-up Information     Stechschulte, Nickola Major, MD Follow up on 09/15/2022.   Specialty: Surgery Why: 10 am, Arrive 30 minutes prior to your appointment time, Please bring your insurance card and photo ID Contact information: [id number] [place] [address] [phone]         [facility] Follow up on 09/13/2022.   Why: Please go for CT cystogram on this date.   Order has been sent.  Radiology should call you to schedule this.  If not please call to confirm. Contact information: [place] [address] [id number]         Joya Gaskins, FNP. Schedule an appointment as soon as possible for a visit in 1 week(s).   Specialty: Family Medicine Contact information: [address] [place] [address] [phone]                Allergies  Allergen Reactions   Shellfish Allergy Hives, Itching and Nausea Only    Consultations: General surgery   Procedures/Studies: CT ABDOMEN PELVIS W CONTRAST  Result Date: 08/30/2022 CLINICAL DATA:  Acute abdominal pain EXAM: CT ABDOMEN AND PELVIS WITH CONTRAST TECHNIQUE: Multidetector CT imaging of the abdomen and pelvis was performed using the standard protocol following bolus administration of intravenous contrast. RADIATION DOSE REDUCTION: This exam was performed according to the departmental dose-optimization program which includes automated exposure control, adjustment of the mA and/or kV according to patient size and/or use of iterative reconstruction technique. CONTRAST:  20m OMNIPAQUE IOHEXOL 350 MG/ML SOLN COMPARISON:  06/22/2022 FINDINGS: Lower chest: No acute abnormality. Hepatobiliary: No focal liver abnormality is seen. Status post cholecystectomy. No biliary dilatation. Pancreas: Unremarkable. No pancreatic ductal dilatation or surrounding inflammatory changes. Spleen: Normal in size without focal abnormality. Adrenals/Urinary Tract: The adrenal glands are normal. There is bilateral hydroureteronephrosis. Nonobstructing 6 mm stone in the lower right renal pelvis. Stomach/Bowel: There is an enormous amount of stool within the rectum. There is diffuse rectal wall edema with multiple areas of hyperdensity within the rectal lumen, concerning for hemorrhage. Additionally, there is an area of discontinuity along the anterior rectal wall (series 7, image 73). This is likely  perforation with possible fistulous communication with the upper vaginal vault. There is possible pneumatosis coli of the anterior rectal wall (series 3, image 42). Vascular/Lymphatic: Aortic atherosclerosis. No enlarged abdominal or pelvic lymph nodes. Reproductive: Possible fistulous communication between the vagina and rectum. Other: None Musculoskeletal: No acute or significant osseous findings. IMPRESSION: 1. Stool distended distal colon with diffuse rectal wall edema, consistent with stercoral colitis. 2. Multiple areas of hyperdensity within the rectal lumen, concerning for hemorrhage. 3. Anterior rectal perforation with potential fistulous communication with the upper vagina. 4. Bilateral hydroureteronephrosis, likely due to mass effect on the distal ureters by the large rectal stool ball. Aortic Atherosclerosis (ICD10-I70.0). These results were called by telephone at the time of interpretation on 08/30/2022 at 2:02 am [facility] who verbally acknowledged these results. Electronically Signed   By: KUlyses JarredM.D.   On: 08/30/2022 02:03  DG Chest Port 1 View  Result Date: 08/29/2022 CLINICAL DATA:  Sepsis, rectal bleeding EXAM: PORTABLE CHEST 1 VIEW COMPARISON:  09/20/2021 FINDINGS: Single frontal view of the chest demonstrates an unremarkable cardiac silhouette. Ectasia of the thoracic aorta. No acute airspace disease, effusion, or pneumothorax. No acute bony abnormalities. IMPRESSION: 1. No acute intrathoracic process. Electronically Signed   By: Randa Ngo M.D.   On: 08/29/2022 23:00     Subjective: Patient seen examined bedside, resting comfortably.  Lying in bed.  Discharging to SNF today.  No specific complaints this morning.  General surgery to remove JP drain before discharge.  Denies headache, no chest pain, no shortness of breath, no abdominal pain.  No acute events overnight per nursing staff.  Discharge Exam: Vitals:   09/06/22 0817 09/06/22 0831  BP:  125/61   Pulse: 95 (!) 103  Resp: 18 19  Temp:  97.7 F (36.5 C)  SpO2: 99% 100%   Vitals:   09/06/22 0410 09/06/22 0600 09/06/22 0817 09/06/22 0831  BP: (!) 125/58   125/61  Pulse: 91  95 (!) 103  Resp: '17  18 19  '$ Temp: (!) 97.4 F (36.3 C)   97.7 F (36.5 C)  TempSrc: Oral   Oral  SpO2: 99%  99% 100%  Weight:  56.6 kg    Height:        Physical Exam: GEN: NAD, alert  HEENT: NCAT, PERRL, EOMI, sclera clear, MMM PULM: CTAB w/o wheezes/crackles, normal respiratory effort, on room air CV: RRR w/o M/G/R GI: abd soft, nondistended, nontender to palpation, colostomy noted with brown stool and air noted in collection bag. Abdominal surgical wound noted with dressing in place, clean/dry/intact, + BS MSK: no peripheral edema, moves all EXTR independently NEURO: No focal deficits. PSYCH: normal mood/affect Integumentary: Abdominal surgical wound as above, otherwise no other concerning rashes/lesions/wounds noted on exposed skin surfaces     The results of significant diagnostics from this hospitalization (including imaging, microbiology, ancillary and laboratory) are listed below for reference.     Microbiology: Recent Results (from the past 240 hour(s))  Urine Culture (for pregnant, neutropenic or urologic patients or patients with an indwelling urinary catheter)     Status: None   Collection Time: 08/29/22 10:44 PM   Specimen: Urine, Clean Catch  Result Value Ref Range Status   Specimen Description URINE, CLEAN CATCH  Final   Special Requests NONE  Final   Culture   Final    NO GROWTH Performed at [facility], [place] [address] [place], [place] [address]    Report Status 08/31/2022 FINAL  Final  Resp panel by RT-PCR (RSV, Flu A&B, Covid) Anterior Nasal Swab     Status: None   Collection Time: 08/30/22 12:30 AM   Specimen: Anterior Nasal Swab  Result Value Ref Range Status   SARS Coronavirus 2 by RT PCR NEGATIVE NEGATIVE Final   Influenza A by PCR NEGATIVE NEGATIVE Final    Influenza B by PCR NEGATIVE NEGATIVE Final    Comment: (NOTE) The Xpert Xpress SARS-CoV-2/FLU/RSV plus assay is intended as an aid in the diagnosis of influenza from Nasopharyngeal swab specimens and should not be used as a sole basis for treatment. Nasal washings and aspirates are unacceptable for Xpert Xpress SARS-CoV-2/FLU/RSV testing.  Fact Sheet for Patients: EntrepreneurPulse.com.au  Fact Sheet for Healthcare Providers: IncredibleEmployment.be  This test is not yet approved or cleared by the [place] FDA and has been authorized for detection and/or diagnosis of SARS-CoV-2 by  FDA under an Emergency Use Authorization (EUA). This EUA will remain in effect (meaning this test can be used) for the duration of the COVID-19 declaration under Section 564(b)(1) of the Act, 21 U.S.C. section 360bbb-3(b)(1), unless the authorization is terminated or revoked.     Resp Syncytial Virus by PCR NEGATIVE NEGATIVE Final    Comment: (NOTE) Fact Sheet for Patients: EntrepreneurPulse.com.au  Fact Sheet for Healthcare Providers: IncredibleEmployment.be  This test is not yet approved or cleared by the [place] FDA and has been authorized for detection and/or diagnosis of SARS-CoV-2 by FDA under an Emergency Use Authorization (EUA). This EUA will remain in effect (meaning this test can be used) for the duration of the COVID-19 declaration under Section 564(b)(1) of the Act, 21 U.S.C. section 360bbb-3(b)(1), unless the authorization is terminated or revoked.  Performed at [facility], [place] [address], [place], [place] [address]   MRSA Next Gen by PCR, Nasal     Status: None   Collection Time: 08/30/22 10:08 AM   Specimen: Nasal Mucosa; Nasal Swab  Result Value Ref Range Status   MRSA by PCR Next Gen NOT DETECTED NOT DETECTED Final    Comment: (NOTE) The GeneXpert MRSA Assay (FDA approved for NASAL  specimens only), is one component of a comprehensive MRSA colonization surveillance program. It is not intended to diagnose MRSA infection nor to guide or monitor treatment for MRSA infections. Test performance is not FDA approved in patients less than 37 years old. Performed at [facility], [place] [address] [place], [place] [address]   Blood Culture (routine x 2)     Status: None   Collection Time: 08/30/22 12:11 PM   Specimen: BLOOD RIGHT HAND  Result Value Ref Range Status   Specimen Description BLOOD RIGHT HAND  Final   Special Requests   Final    BOTTLES DRAWN AEROBIC AND ANAEROBIC Blood Culture results may not be optimal due to an inadequate volume of blood received in culture bottles   Culture   Final    NO GROWTH 5 DAYS Performed at [facility], [place] [address] [place], [place] [address]    Report Status 09/04/2022 FINAL  Final  Blood Culture (routine x 2)     Status: None   Collection Time: 08/30/22 12:19 PM   Specimen: BLOOD RIGHT HAND  Result Value Ref Range Status   Specimen Description BLOOD RIGHT HAND  Final   Special Requests   Final    BOTTLES DRAWN AEROBIC AND ANAEROBIC Blood Culture adequate volume   Culture   Final    NO GROWTH 5 DAYS Performed at [facility], [place] [address], [place],  [address]    Report Status 09/04/2022 FINAL  Final     Labs: BNP (last 3 results) No results for input(s): "BNP" in the last 8760 hours. Basic Metabolic Panel: Recent Labs  Lab 08/31/22 0530 09/01/22 1154 09/03/22 0458 09/05/22 0250 09/06/22 0454  NA 138 136 140 138 136  K 3.9 3.7 3.7 3.0* 4.2  CL 108 104 105 102 104  CO2 '24 24 22 29 25  '$ GLUCOSE 168* 132* 113* 169* 127*  BUN <5* 5* <5* 5* <5*  CREATININE 0.65 0.59 0.90 0.55 0.54  CALCIUM 8.2* 8.0* 8.2* 8.2* 8.5*  MG 1.6* 2.1 2.0 1.9 1.8  PHOS 2.0* 2.2* 3.2  --   --    Liver Function Tests: No results for input(s): "AST", "ALT", "ALKPHOS", "BILITOT", "PROT", "ALBUMIN" in the last  168  hours. No results for input(s): "LIPASE", "AMYLASE" in the last 168 hours. Recent Labs  Lab 09/04/22 1137  AMMONIA 29   CBC: Recent Labs  Lab 08/30/22 1729 08/31/22 0530 09/01/22 1154 09/03/22 0458 09/05/22 0250  WBC 13.5* 13.5* 12.0* 11.3* 9.1  HGB 10.2* 9.3* 9.3* 10.5* 8.1*  HCT 29.6* 26.8* 25.9* 30.8* 24.3*  MCV 87.8 89.6 90.6 92.2 92.4  PLT 253 227 280 367 327   Cardiac Enzymes: No results for input(s): "CKTOTAL", "CKMB", "CKMBINDEX", "TROPONINI" in the last 168 hours. BNP: Invalid input(s): "POCBNP" CBG: Recent Labs  Lab 09/05/22 1658 09/05/22 2009 09/05/22 2352 09/06/22 0439 09/06/22 0827  GLUCAP 129* 156* 111* 108* 112*   D-Dimer No results for input(s): "DDIMER" in the last 72 hours. Hgb A1c No results for input(s): "HGBA1C" in the last 72 hours. Lipid Profile No results for input(s): "CHOL", "HDL", "LDLCALC", "TRIG", "CHOLHDL", "LDLDIRECT" in the last 72 hours. Thyroid function studies Recent Labs    09/04/22 1137  TSH 3.755   Anemia work up Recent Labs    09/04/22 1137  VITAMINB12 1,220*  FOLATE 19.1   Urinalysis    Component Value Date/Time   COLORURINE YELLOW 08/30/2022 1129   APPEARANCEUR CLEAR 08/30/2022 1129   LABSPEC 1.019 08/30/2022 1129   PHURINE 6.0 08/30/2022 1129   GLUCOSEU 150 (A) 08/30/2022 1129   HGBUR LARGE (A) 08/30/2022 1129   BILIRUBINUR NEGATIVE 08/30/2022 1129   KETONESUR NEGATIVE 08/30/2022 1129   PROTEINUR NEGATIVE 08/30/2022 1129   NITRITE NEGATIVE 08/30/2022 1129   LEUKOCYTESUR TRACE (A) 08/30/2022 1129   Sepsis Labs Recent Labs  Lab 08/31/22 0530 09/01/22 1154 09/03/22 0458 09/05/22 0250  WBC 13.5* 12.0* 11.3* 9.1   Microbiology Recent Results (from the past 240 hour(s))  Urine Culture (for pregnant, neutropenic or urologic patients or patients with an indwelling urinary catheter)     Status: None   Collection Time: 08/29/22 10:44 PM   Specimen: Urine, Clean Catch  Result Value Ref Range Status    Specimen Description URINE, CLEAN CATCH  Final   Special Requests NONE  Final   Culture   Final    NO GROWTH Performed at [facility], [place] [address], [place], [place] [address]    Report Status 08/31/2022 FINAL  Final  Resp panel by RT-PCR (RSV, Flu A&B, Covid) Anterior Nasal Swab     Status: None   Collection Time: 08/30/22 12:30 AM   Specimen: Anterior Nasal Swab  Result Value Ref Range Status   SARS Coronavirus 2 by RT PCR NEGATIVE NEGATIVE Final   Influenza A by PCR NEGATIVE NEGATIVE Final   Influenza B by PCR NEGATIVE NEGATIVE Final    Comment: (NOTE) The Xpert Xpress SARS-CoV-2/FLU/RSV plus assay is intended as an aid in the diagnosis of influenza from Nasopharyngeal swab specimens and should not be used as a sole basis for treatment. Nasal washings and aspirates are unacceptable for Xpert Xpress SARS-CoV-2/FLU/RSV testing.  Fact Sheet for Patients: EntrepreneurPulse.com.au  Fact Sheet for Healthcare Providers: IncredibleEmployment.be  This test is not yet approved or cleared by the [place] FDA and has been authorized for detection and/or diagnosis of SARS-CoV-2 by FDA under an Emergency Use Authorization (EUA). This EUA will remain in effect (meaning this test can be used) for the duration of the COVID-19 declaration under Section 564(b)(1) of the Act, 21 U.S.C. section 360bbb-3(b)(1), unless the authorization is terminated or revoked.     Resp Syncytial Virus by PCR NEGATIVE NEGATIVE Final  Comment: (NOTE) Fact Sheet for Patients: EntrepreneurPulse.com.au  Fact Sheet for Healthcare Providers: IncredibleEmployment.be  This test is not yet approved or cleared by the [place] FDA and has been authorized for detection and/or diagnosis of SARS-CoV-2 by FDA under an Emergency Use Authorization (EUA). This EUA will remain in effect (meaning this test can be used) for the duration  of the COVID-19 declaration under Section 564(b)(1) of the Act, 21 U.S.C. section 360bbb-3(b)(1), unless the authorization is terminated or revoked.  Performed at [facility], [place] [address], [place], [place] [address]   MRSA Next Gen by PCR, Nasal     Status: None   Collection Time: 08/30/22 10:08 AM   Specimen: Nasal Mucosa; Nasal Swab  Result Value Ref Range Status   MRSA by PCR Next Gen NOT DETECTED NOT DETECTED Final    Comment: (NOTE) The GeneXpert MRSA Assay (FDA approved for NASAL specimens only), is one component of a comprehensive MRSA colonization surveillance program. It is not intended to diagnose MRSA infection nor to guide or monitor treatment for MRSA infections. Test performance is not FDA approved in patients less than 10 years old. Performed at [facility], [place] [address] [place], [place] [address]   Blood Culture (routine x 2)     Status: None   Collection Time: 08/30/22 12:11 PM   Specimen: BLOOD RIGHT HAND  Result Value Ref Range Status   Specimen Description BLOOD RIGHT HAND  Final   Special Requests   Final    BOTTLES DRAWN AEROBIC AND ANAEROBIC Blood Culture results may not be optimal due to an inadequate volume of blood received in culture bottles   Culture   Final    NO GROWTH 5 DAYS Performed at [facility], [place] [address] [place], [place] [address]    Report Status 09/04/2022 FINAL  Final  Blood Culture (routine x 2)     Status: None   Collection Time: 08/30/22 12:19 PM   Specimen: BLOOD RIGHT HAND  Result Value Ref Range Status   Specimen Description BLOOD RIGHT HAND  Final   Special Requests   Final    BOTTLES DRAWN AEROBIC AND ANAEROBIC Blood Culture adequate volume   Culture   Final    NO GROWTH 5 DAYS Performed at [facility], [place] [address] [place], [place] [address]    Report Status 09/04/2022 FINAL  Final     Time coordinating discharge: Over 30 minutes  SIGNED:   Khali Albanese J [place] DO  [facility] 09/06/2022, 11:43 AM

## 2022-09-06 NOTE — Plan of Care (Signed)
RN called report to [facility], report given to nurse, RN made the nurse aware that foley to not be pulled  per surgery order, pt to follow-up with surgery outpatient about foley, Pt daughter at bedside and aware of discharge today pt scheduled for [facility] pick up at 2 pm, pt belongings packed. Per pt daughter she would take pt belongings, pt going with 1 bag with hospital (ostomy supplies)

## 2022-09-06 NOTE — TOC Transition Note (Signed)
Transition of Care [facility]) - CM/SW Discharge Note   Patient Details  Name: Sophiamarie Knaup MRN: [id number] Date of Birth: 10-16-1940  Transition of Care [facility]) CM/SW Contact:  Jinger Neighbors, LCSW Phone Number: 09/06/2022, 12:07 PM   Clinical Narrative:     Pt discharging to [facility] via [place].  Call to report number is [phone] room 106P   Final next level of care: Skilled Nursing Facility Barriers to Discharge: No Barriers Identified   Patient Goals and CMS Choice CMS Medicare.gov Compare Post Acute Care list provided to:: Patient Choice offered to / list presented to : Adult Children  Discharge Placement                Patient chooses bed at: [facility] Patient to be transferred to facility by: [place] Name of family member notified: Sonia Baller Patient and family notified of of transfer: 09/06/22  Discharge Plan and Services Additional resources added to the After Visit Summary for   In-house Referral: Clinical Social Work                                   Social Determinants of Health ([place]) Interventions     Readmission Risk Interventions     No data to display

## 2022-09-06 NOTE — Progress Notes (Signed)
PROGRESS NOTE    Alyssa Boyd  [id number] DOB: 1941/02/23 DOA: 08/29/2022 PCP: Joya Gaskins, FNP    Brief Narrative:   Alyssa Boyd is a 82 y.o. female with past medical history significant for Parkinson disease, essential hypertension, type 2 diabetes mellitus, hyperlipidemia who presented to [facility] ED on 2/20 from [place] with complaint of abdominal pain and rectal bleeding.  Workup in the ED notable for stercoral colitis with evidence of bleeding and anterior rectal perforation with possible fistulous communication within her upper vagina with bilateral hydroureteronephrosis on CT.  She was evaluated by general surgery and taken to the OR for emergent open left colectomy with low anterior resection and creation of a distal transverse colostomy, bladder injury repair, rectovaginal fistula takedown with vaginal repair by Dr. Thermon Leyland on 08/30/2022.  Postoperatively patient remained intubated and she was initially admitted to the intensive care unit.  She was stabilized and transferred to the floor under the hospitalist service on 09/02/2022.  Patient remains with Foley catheter in place with recommendations of at least 14 days postoperative for bladder repair with plan CT cystogram outpatient before consideration of discontinuing catheter.  Patient completed 6-day course of antibiotics per general surgery.    Currently awaiting SNF placement, medically stable for discharge once bed available.  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  CT abdomen/pelvis notable for stercoral colitis with evidence of bleeding and anterior rectal perforation with possible fistulous communication within her upper vagina with bilateral hydroureteronephrosis on CT.  She was  evaluated by general surgery and taken to the OR for emergent open left colectomy with low anterior resection and creation of a distal transverse colostomy, bladder injury repair, rectovaginal fistula takedown with vaginal repair by Dr. Thermon Leyland on 08/30/2022.  Patient completed 6-day course of vancomycin, cefepime, Flagyl, Zosyn per general surgery.  Diet advanced to soft diet.  Remains with Foley catheter in place with recommendations to maintain for at least 14 days following bladder repair with plan for CT cystogram outpatient. --General surgery now signed off, okay for discharge from their standpoint -- Soft diet -- Continue to monitor drain output -- Continue to encourage mobilization -- Abdominal dressing changed twice daily -- Pending SNF placement, medically stable for discharge once bed available  Acute metabolic encephalopathy likely secondary to hospital-acquired delirium Vitamin B12 1220, ammonia level 29, TSH 3.755, within normal limits.  Suspect sundowning due to hospital-acquired delirium. --Delirium precautions --Get up during the day --Encourage a familiar face to remain present throughout the day --Keep blinds open and lights on during daylight hours --Minimize the use of opioids/benzodiazepines --Melatonin 3 mg p.o. nightly  Hypokalemia Repleted.  Potassium 4.2 this morning.  Hx COPD Home regimen includes Advair, Singulair, Spiriva. -- Brovana neb twice daily -- Pulmicort neb twice daily -- DuoNeb every 4 hours as needed wheezing/shortness of breath  Anemia of critical illness Hemoglobin stable, 8.1.  No evidence for overt bleeding.  Hypophosphatemia Stable  Essential hypertension -- Metoprolol tartrate 50 mg p.o. twice daily -- Irbesartan 75 mg p.o. daily  Hyperlipidemia -- Atorvastatin 20 mg p.o. daily  Type 2 diabetes mellitus Hemoglobin A1c 5.8.  Diet controlled at baseline.   DVT prophylaxis: enoxaparin (LOVENOX) injection 40 mg Start: 08/31/22  2200 SCDs Start: 08/30/22 0448    Code Status: Full Code Family Communication: No family  present at bedside this morning, updated daughter present at bedside yesterday and via telephone yesterday afternoon  Disposition Plan:  Level of care: Med-Surg Status is: Inpatient Remains inpatient appropriate because: Medically stable for discharge to SNF once bed available    Consultants:  General surgery  Procedures:  Open left colectomy with low anterior resection and creation of end distal transverse colostomy, bladder injury repair, rectovaginal fistula takedown with vaginal repair, Dr. Thermon Leyland; 08/30/2022  Antimicrobials:  Vancomycin 2/20 - 2/22 Cefepime 2/20 - 2/22 Metronidazole 2/20 - 2/22 Zosyn 2/23 - 2/25   Subjective: Patient seen examined bedside, resting currently.  Lying in bed.  Sleeping but arousable.  RT present at bedside and patient receiving neb treatment.  No family present at bedside this morning.  No specific complaints this morning.  Pending SNF placement.  Medically stable for discharge once SNF bed available.  Objective: Vitals:   09/06/22 0410 09/06/22 0600 09/06/22 0817 09/06/22 0831  BP: (!) 125/58   125/61  Pulse: 91  95 (!) 103  Resp: '17  18 19  '$ Temp: (!) 97.4 F (36.3 C)   97.7 F (36.5 C)  TempSrc: Oral   Oral  SpO2: 99%  99% 100%  Weight:  56.6 kg    Height:        Intake/Output Summary (Last 24 hours) at 09/06/2022 1056 Last data filed at 09/06/2022 0410 Gross per 24 hour  Intake 200 ml  Output 1365 ml  Net -1165 ml   Filed Weights   09/04/22 1100 09/05/22 0500 09/06/22 0600  Weight: 56.8 kg 55.8 kg 56.6 kg    Examination:  Physical Exam: GEN: NAD, alert HEENT: NCAT, PERRL, EOMI, sclera clear, MMM PULM: CTAB w/o wheezes/crackles, normal respiratory effort, on room air CV: RRR w/o M/G/R GI: abd soft, NTND, colostomy noted with brown stool and air noted in collection bag, JP drain noted, midline abdominal surgical wound noted with  dressing in place, clean/dry/intact, NABS MSK: no peripheral edema, moves all extremities independently NEURO: No focal deficits PSYCH: normal mood/affect Integumentary: dry/intact, no rashes or wounds    Data Reviewed: I have personally reviewed following labs and imaging studies  CBC: Recent Labs  Lab 08/30/22 1729 08/31/22 0530 09/01/22 1154 09/03/22 0458 09/05/22 0250  WBC 13.5* 13.5* 12.0* 11.3* 9.1  HGB 10.2* 9.3* 9.3* 10.5* 8.1*  HCT 29.6* 26.8* 25.9* 30.8* 24.3*  MCV 87.8 89.6 90.6 92.2 92.4  PLT 253 227 280 367 [id number]   Basic Metabolic Panel: Recent Labs  Lab 08/31/22 0530 09/01/22 1154 09/03/22 0458 09/05/22 0250 09/06/22 0454  NA 138 136 140 138 136  K 3.9 3.7 3.7 3.0* 4.2  CL 108 104 105 102 104  CO2 '24 24 22 29 25  '$ GLUCOSE 168* 132* 113* 169* 127*  BUN <5* 5* <5* 5* <5*  CREATININE 0.65 0.59 0.90 0.55 0.54  CALCIUM 8.2* 8.0* 8.2* 8.2* 8.5*  MG 1.6* 2.1 2.0 1.9 1.8  PHOS 2.0* 2.2* 3.2  --   --    GFR: Estimated Creatinine Clearance: 50.1 mL/min (by C-G formula based on SCr of 0.54 mg/dL). Liver Function Tests: No results for input(s): "AST", "ALT", "ALKPHOS", "BILITOT", "PROT", "ALBUMIN" in the last 168 hours.  No results for input(s): "LIPASE", "AMYLASE" in the last 168 hours.  Recent Labs  Lab 09/04/22 1137  AMMONIA 29   Coagulation Profile: No results for input(s): "INR", "PROTIME" in the last 168 hours.  Cardiac Enzymes: No results for input(s): "CKTOTAL", "CKMB", "CKMBINDEX", "TROPONINI" in the last 168  hours. BNP (last 3 results) No results for input(s): "PROBNP" in the last 8760 hours. HbA1C: No results for input(s): "HGBA1C" in the last 72 hours. CBG: Recent Labs  Lab 09/05/22 1658 09/05/22 2009 09/05/22 2352 09/06/22 0439 09/06/22 0827  GLUCAP 129* 156* 111* 108* 112*   Lipid Profile: No results for input(s): "CHOL", "HDL", "LDLCALC", "TRIG", "CHOLHDL", "LDLDIRECT" in the last 72 hours. Thyroid Function Tests: Recent Labs     09/04/22 1137  TSH 3.755   Anemia Panel: Recent Labs    09/04/22 1137  VITAMINB12 1,220*  FOLATE 19.1   Sepsis Labs: Recent Labs  Lab 08/31/22 0530  LATICACIDVEN 0.9    Recent Results (from the past 240 hour(s))  Urine Culture (for pregnant, neutropenic or urologic patients or patients with an indwelling urinary catheter)     Status: None   Collection Time: 08/29/22 10:44 PM   Specimen: Urine, Clean Catch  Result Value Ref Range Status   Specimen Description URINE, CLEAN CATCH  Final   Special Requests NONE  Final   Culture   Final    NO GROWTH Performed at [facility], [place] [address], [place], [place] [address]    Report Status 08/31/2022 FINAL  Final  Resp panel by RT-PCR (RSV, Flu A&B, Covid) Anterior Nasal Swab     Status: None   Collection Time: 08/30/22 12:30 AM   Specimen: Anterior Nasal Swab  Result Value Ref Range Status   SARS Coronavirus 2 by RT PCR NEGATIVE NEGATIVE Final   Influenza A by PCR NEGATIVE NEGATIVE Final   Influenza B by PCR NEGATIVE NEGATIVE Final    Comment: (NOTE) The Xpert Xpress SARS-CoV-2/FLU/RSV plus assay is intended as an aid in the diagnosis of influenza from Nasopharyngeal swab specimens and should not be used as a sole basis for treatment. Nasal washings and aspirates are unacceptable for Xpert Xpress SARS-CoV-2/FLU/RSV testing.  Fact Sheet for Patients: EntrepreneurPulse.com.au  Fact Sheet for Healthcare Providers: IncredibleEmployment.be  This test is not yet approved or cleared by the [place] FDA and has been authorized for detection and/or diagnosis of SARS-CoV-2 by FDA under an Emergency Use Authorization (EUA). This EUA will remain in effect (meaning this test can be used) for the duration of the COVID-19 declaration under Section 564(b)(1) of the Act, 21 U.S.C. section 360bbb-3(b)(1), unless the authorization is terminated or revoked.     Resp Syncytial Virus by  PCR NEGATIVE NEGATIVE Final    Comment: (NOTE) Fact Sheet for Patients: EntrepreneurPulse.com.au  Fact Sheet for Healthcare Providers: IncredibleEmployment.be  This test is not yet approved or cleared by the [place] FDA and has been authorized for detection and/or diagnosis of SARS-CoV-2 by FDA under an Emergency Use Authorization (EUA). This EUA will remain in effect (meaning this test can be used) for the duration of the COVID-19 declaration under Section 564(b)(1) of the Act, 21 U.S.C. section 360bbb-3(b)(1), unless the authorization is terminated or revoked.  Performed at [facility], [place] [address] [place], [place] [address]   MRSA Next Gen by PCR, Nasal     Status: None   Collection Time: 08/30/22 10:08 AM   Specimen: Nasal Mucosa; Nasal Swab  Result Value Ref Range Status   MRSA by PCR Next Gen NOT DETECTED NOT DETECTED Final    Comment: (NOTE) The GeneXpert MRSA Assay (FDA approved for NASAL specimens only), is one component of a comprehensive MRSA colonization surveillance program. It is not intended to diagnose MRSA infection nor to guide or  monitor treatment for MRSA infections. Test performance is not FDA approved in patients less than 18 years old. Performed at [facility], [place] [address] [place], [place] [address]   Blood Culture (routine x 2)     Status: None   Collection Time: 08/30/22 12:11 PM   Specimen: BLOOD RIGHT HAND  Result Value Ref Range Status   Specimen Description BLOOD RIGHT HAND  Final   Special Requests   Final    BOTTLES DRAWN AEROBIC AND ANAEROBIC Blood Culture results may not be optimal due to an inadequate volume of blood received in culture bottles   Culture   Final    NO GROWTH 5 DAYS Performed at [facility], [place] [address], [place], [place] [address]    Report Status 09/04/2022 FINAL  Final  Blood Culture (routine x 2)     Status: None   Collection Time: 08/30/22 12:19  PM   Specimen: BLOOD RIGHT HAND  Result Value Ref Range Status   Specimen Description BLOOD RIGHT HAND  Final   Special Requests   Final    BOTTLES DRAWN AEROBIC AND ANAEROBIC Blood Culture adequate volume   Culture   Final    NO GROWTH 5 DAYS Performed at [facility], [place] [address] [place], [place] [address]    Report Status 09/04/2022 FINAL  Final         Radiology Studies: No results found.      Scheduled Meds:  acetaminophen  1,000 mg Oral Q8H   arformoterol  15 mcg Nebulization BID   atorvastatin  20 mg Oral Daily   budesonide (PULMICORT) nebulizer solution  0.5 mg Nebulization BID   busPIRone  5 mg Oral BID   carbidopa-levodopa  1 tablet Oral QID   Chlorhexidine Gluconate Cloth  6 each Topical Daily   docusate sodium  100 mg Oral BID   donepezil  5 mg Oral QHS   enoxaparin (LOVENOX) injection  40 mg Subcutaneous Q24H   feeding supplement  237 mL Oral TID WC   insulin aspart  0-9 Units Subcutaneous Q4H   irbesartan  75 mg Oral Daily   melatonin  3 mg Oral QHS   metoprolol tartrate  50 mg Oral BID   multivitamin with minerals  1 tablet Oral Daily   polyethylene glycol  17 g Oral BID   rOPINIRole  8 mg Oral QHS   senna  1 tablet Oral Daily   Continuous Infusions:  methocarbamol (ROBAXIN) IV       LOS: 7 days    Time spent: 48 minutes spent on chart review, discussion with nursing staff, consultants, updating family and interview/physical exam; more than 50% of that time was spent in counseling and/or coordination of care.    Brandilyn Nanninga J [place] DO [facility] Available via Epic secure chat 7am-7pm After these hours, please refer to coverage provider listed on [email or web address] 09/06/2022, 10:56 AM

## 2022-09-06 NOTE — Progress Notes (Signed)
Occupational Therapy Treatment Patient Details Name: Alyssa Boyd MRN: [id number] DOB: 1941-03-01 Today's Date: 09/06/2022   History of present illness Patient is a 82 y/o female who presents on 2/21 with abdominal pain and rectal bleeding, found to have stercoral perforation now s/p Open L colectomy with LAR and transverse colostomy, bladder injury repair, rectovaginal takedown with vaginal repair on 2/21, extubated 2/22. PMH includes PD, HTN, asthma, DM, dementia, OSA, RLS.   OT comments  Patient received in supine and agreeable to OT session. Patient instructed on bed mobility and mod assist to get to EOB. Attempted to stand using Stedy with patient unable to stand and transferred to recliner with stand pivot transfer. Patient provided setup for self feeding with patient able to manage cup and limited attempts to feed self due to no appetite. Patient asked to return to bed due to pain and discomfort in recliner. Discharge recommendations for SNF continue to be appropriate for continued OT treatment to address self feeding and functional transfers. Acute OT to continue to follow.    Recommendations for follow up therapy are one component of a multi-disciplinary discharge planning process, led by the attending physician.  Recommendations may be updated based on patient status, additional functional criteria and insurance authorization.    Follow Up Recommendations  Skilled nursing-short term rehab (<3 hours/day)     Assistance Recommended at Discharge Frequent or constant Supervision/Assistance  Patient can return home with the following  Two people to help with walking and/or transfers;A lot of help with bathing/dressing/bathroom;Assistance with feeding;Assist for transportation;Help with stairs or ramp for entrance   Equipment Recommendations  Other (comment) (defer to next venue)    Recommendations for Other Services      Precautions / Restrictions Precautions Precautions: Fall;Other  (comment) Precaution Comments: watch HR, JP drain, ostomy Restrictions Weight Bearing Restrictions: No Other Position/Activity Restrictions: Prior injury/contracture of Lt ankle       Mobility Bed Mobility Overal bed mobility: Needs Assistance Bed Mobility: Supine to Sit, Sit to Supine     Supine to sit: Mod assist, HOB elevated Sit to supine: Max assist   General bed mobility comments: required assistance with trunk and BLEs with use of bed pads to scoot towards EOB    Transfers Overall transfer level: Needs assistance Equipment used: None Transfers: Sit to/from Stand, Bed to chair/wheelchair/BSC Sit to Stand: Mod assist Stand pivot transfers: Max assist         General transfer comment: attempted to stand with Stedy but patient unable to stand. face to face technique used to transfer to recliner and back to bed     Balance Overall balance assessment: Needs assistance Sitting-balance support: Feet supported, Single extremity supported Sitting balance-Leahy Scale: Poor Sitting balance - Comments: min guard to min assist for sitting balance on EOB Postural control: Posterior lean Standing balance support: Bilateral upper extremity supported Standing balance-Leahy Scale: Poor Standing balance comment: stood for transfer                           ADL either performed or assessed with clinical judgement   ADL Overall ADL's : Needs assistance/impaired Eating/Feeding: Minimal assistance;Sitting Eating/Feeding Details (indicate cue type and reason): limited participation with self feeding due to patient stating she did not want to eat Grooming: Wash/dry hands;Wash/dry face;Set up;Sitting Grooming Details (indicate cue type and reason): in recliner             Lower Body Dressing: Total assistance Lower Body  Dressing Details (indicate cue type and reason): t donn footwear               General ADL Comments: limited with self feeding due to no  appetite    Extremity/Trunk Assessment              Vision       Perception     Praxis      Cognition Arousal/Alertness: Awake/alert Behavior During Therapy: WFL for tasks assessed/performed Overall Cognitive Status: Impaired/Different from baseline                                 General Comments: appeared alert and oriented but believed there was a baby in bed with her        Exercises      Shoulder Instructions       General Comments      Pertinent Vitals/ Pain       Pain Assessment Pain Assessment: Faces Faces Pain Scale: Hurts little more Pain Location: back and feet Pain Descriptors / Indicators: Grimacing, Guarding, Sore Pain Intervention(s): Limited activity within patient's tolerance, Monitored during session, Repositioned  Home Living                                          Prior Functioning/Environment              Frequency  Min 2X/week        Progress Toward Goals  OT Goals(current goals can now be found in the care plan section)  Progress towards OT goals: Progressing toward goals  Acute Rehab OT Goals Patient Stated Goal: get better OT Goal Formulation: With patient Time For Goal Achievement: 09/15/22 Potential to Achieve Goals: Fair ADL Goals Pt Will Perform Eating: with set-up;with adaptive utensils;sitting Pt Will Transfer to Toilet: with mod assist;stand pivot transfer;grab bars;regular height toilet Additional ADL Goal #1: Patient will demonstrate improved postural control and sitting balance while seated at EOB with Mod A for balance.  Plan Discharge plan remains appropriate    Co-evaluation                 AM-PAC OT "6 Clicks" Daily Activity     Outcome Measure   Help from another person eating meals?: A Lot Help from another person taking care of personal grooming?: A Lot Help from another person toileting, which includes using toliet, bedpan, or urinal?: Total Help from  another person bathing (including washing, rinsing, drying)?: Total Help from another person to put on and taking off regular upper body clothing?: Total Help from another person to put on and taking off regular lower body clothing?: Total 6 Click Score: 8    End of Session Equipment Utilized During Treatment: Gait belt  OT Visit Diagnosis: Muscle weakness (generalized) (M62.81);Feeding difficulties (R63.3)   Activity Tolerance Patient limited by pain   Patient Left in bed;with call bell/phone within reach;with bed alarm set   Nurse Communication Mobility status        Time: [id number] OT Time Calculation (min): 27 min  Charges: OT General Charges $OT Visit: 1 Visit OT Treatments $Self Care/Home Management : 8-22 mins $Therapeutic Activity: 8-22 mins  Lodema Hong, OTA Acute Rehabilitation Services  Office [phone]   Trixie Dredge 09/06/2022, 1:14 PM

## 2022-09-06 NOTE — Progress Notes (Signed)
7 Days Post-Op  Subjective: CC: No complaints this am. No abdominal pain. Tolerating po. Per RN note, she ate 50% of her lunch and 70% of dinner. No vomiting. Having ostomy output. Foley in place.   Objective: Vital signs in last 24 hours: Temp:  [97.4 F (36.3 C)-98.8 F (37.1 C)] 97.7 F (36.5 C) (02/28 0831) Pulse Rate:  [91-103] 103 (02/28 0831) Resp:  [16-19] 19 (02/28 0831) BP: (115-133)/(53-61) 125/61 (02/28 0831) SpO2:  [98 %-100 %] 100 % (02/28 0831) Weight:  [56.6 kg] 56.6 kg (02/28 0600) Last BM Date : 09/05/22 (colostomy)  Intake/Output from previous day: 02/27 0701 - 02/28 0700 In: 200 [P.O.:200] Out: 1515 [Urine:850; Drains:15; Stool:650] Intake/Output this shift: No intake/output data recorded.  PE: General: No acute distress, comfortable Lungs: normal work of breathing Abd: soft, appropriately TTP, nondistended; midline wound with staples in place, cdi. Ostomy with stoma pink and viable - gas and brown stool in pouch. JP with SS fluid in bulb GU: foley draining clear yellow urine  Lab Results:  Recent Labs    09/05/22 0250  WBC 9.1  HGB 8.1*  HCT 24.3*  PLT 327   BMET Recent Labs    09/05/22 0250 09/06/22 0454  NA 138 136  K 3.0* 4.2  CL 102 104  CO2 29 25  GLUCOSE 169* 127*  BUN 5* <5*  CREATININE 0.55 0.54  CALCIUM 8.2* 8.5*   PT/INR No results for input(s): "LABPROT", "INR" in the last 72 hours. CMP     Component Value Date/Time   NA 136 09/06/2022 0454   K 4.2 09/06/2022 0454   CL 104 09/06/2022 0454   CO2 25 09/06/2022 0454   GLUCOSE 127 (H) 09/06/2022 0454   BUN <5 (L) 09/06/2022 0454   CREATININE 0.54 09/06/2022 0454   CALCIUM 8.5 (L) 09/06/2022 0454   PROT 7.0 08/29/2022 2210   ALBUMIN 2.7 (L) 08/29/2022 2210   AST 15 08/29/2022 2210   ALT 6 08/29/2022 2210   ALKPHOS 113 08/29/2022 2210   BILITOT 0.1 (L) 08/29/2022 2210   GFRNONAA >60 09/06/2022 0454   Lipase     Component Value Date/Time   LIPASE 31  08/29/2022 2210    Studies/Results: No results found.  Anti-infectives: Anti-infectives (From admission, onward)    Start     Dose/Rate Route Frequency Ordered Stop   09/01/22 1200  piperacillin-tazobactam (ZOSYN) IVPB 3.375 g  Status:  Discontinued        3.375 g 12.5 mL/hr over 240 Minutes Intravenous Every 8 hours 09/01/22 1015 09/04/22 1111   08/31/22 0030  vancomycin (VANCOREADY) IVPB 750 mg/150 mL  Status:  Discontinued        750 mg 150 mL/hr over 60 Minutes Intravenous Every 24 hours 08/30/22 1102 09/01/22 1015   08/30/22 1500  metroNIDAZOLE (FLAGYL) IVPB 500 mg  Status:  Discontinued        500 mg 100 mL/hr over 60 Minutes Intravenous Every 12 hours 08/30/22 1013 09/01/22 1015   08/30/22 1200  ceFEPIme (MAXIPIME) 2 g in sodium chloride 0.9 % 100 mL IVPB  Status:  Discontinued        2 g 200 mL/hr over 30 Minutes Intravenous Every 12 hours 08/30/22 1102 09/01/22 1015   08/29/22 2245  ceFEPIme (MAXIPIME) 2 g in sodium chloride 0.9 % 100 mL IVPB        2 g 200 mL/hr over 30 Minutes Intravenous  Once 08/29/22 2244 08/30/22 0017   08/29/22 2245  metroNIDAZOLE (  FLAGYL) IVPB 500 mg        500 mg 100 mL/hr over 60 Minutes Intravenous  Once 08/29/22 2244 08/30/22 0407   08/29/22 2245  vancomycin (VANCOCIN) IVPB 1000 mg/200 mL premix        1,000 mg 200 mL/hr over 60 Minutes Intravenous  Once 08/29/22 2244 08/30/22 0231        Assessment/Plan 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 Dr. Lanny Hurst 2/21 - Tolerating diet and having ostomy output - Completed abx. Afebrile. Last wbc wnl - Continue foley at least 14 days s/p bladder repair- plan for CT cystogram (as outpatient if home) prior to catheter D/C. - JP SS. Remove today - Penrose out - PT/OT, mobilize - NOTHING PER RECTUM    Medically stable for dc from surgical perspective. TRH reports she  has a bed at SNF today. Will ask RN to remove JP drain. F/u has been arranged.    FEN: soft, ensure/breeze TID, IVF per CCM ID: cefepime, flagyl, vanc, zosyn. Abx completed. WBC normal on last check VTE: lovenox   LOS: 7 days    Alyssa Boyd , [facility] 09/06/2022, 10:41 AM Please see Amion for pager number during day hours 7:00am-4:30pm

## 2022-09-08 ENCOUNTER — Other Ambulatory Visit (HOSPITAL_COMMUNITY): Payer: Self-pay | Admitting: Surgery

## 2022-09-08 DIAGNOSIS — Z9889 Other specified postprocedural states: Secondary | ICD-10-CM

## 2022-09-11 ENCOUNTER — Other Ambulatory Visit (HOSPITAL_COMMUNITY): Payer: Self-pay | Admitting: Surgery

## 2022-09-11 DIAGNOSIS — Z9889 Other specified postprocedural states: Secondary | ICD-10-CM

## 2022-09-11 DIAGNOSIS — N1330 Unspecified hydronephrosis: Secondary | ICD-10-CM

## 2022-09-11 DIAGNOSIS — S3729XD Other injury of bladder, subsequent encounter: Secondary | ICD-10-CM

## 2022-09-11 DIAGNOSIS — S3729XA Other injury of bladder, initial encounter: Secondary | ICD-10-CM

## 2022-09-13 ENCOUNTER — Ambulatory Visit (HOSPITAL_COMMUNITY)
Admission: RE | Admit: 2022-09-13 | Discharge: 2022-09-13 | Disposition: A | Discharge status: Home / Self Care | Payer: Medicare Other | Source: Ambulatory Visit | Attending: Surgery | Admitting: Surgery

## 2022-09-13 ENCOUNTER — Encounter (HOSPITAL_COMMUNITY): Payer: Self-pay

## 2022-09-13 ENCOUNTER — Ambulatory Visit (HOSPITAL_COMMUNITY): Payer: TRICARE For Life (TFL)

## 2022-09-13 DIAGNOSIS — Z9889 Other specified postprocedural states: Secondary | ICD-10-CM | POA: Insufficient documentation

## 2022-09-13 DIAGNOSIS — N1330 Unspecified hydronephrosis: Secondary | ICD-10-CM | POA: Insufficient documentation

## 2022-09-13 DIAGNOSIS — S3729XA Other injury of bladder, initial encounter: Secondary | ICD-10-CM

## 2022-09-13 DIAGNOSIS — I700 Atherosclerosis of aorta: Secondary | ICD-10-CM | POA: Diagnosis present

## 2022-09-13 DIAGNOSIS — X58XXXA Exposure to other specified factors, initial encounter: Secondary | ICD-10-CM | POA: Diagnosis not present

## 2022-09-13 DIAGNOSIS — N200 Calculus of kidney: Secondary | ICD-10-CM | POA: Insufficient documentation

## 2022-09-13 MED ORDER — IOHEXOL 300 MG/ML  SOLN
50.0000 mL | Freq: Once | INTRAMUSCULAR | Status: AC | PRN
  Administered 2022-09-13: 50 mL

## 2022-09-13 MED ORDER — SODIUM CHLORIDE 0.9 % IV SOLN
INTRAVENOUS | Status: AC
  Filled 2022-09-13: qty 500

## 2022-09-13 MED ORDER — IOHEXOL 300 MG/ML  SOLN: 50.0000 mL | Freq: Once | INTRAMUSCULAR | Status: DC | PRN

## 2022-09-13 MED ORDER — SODIUM CHLORIDE (PF) 0.9 % IJ SOLN
INTRAMUSCULAR | Status: AC
  Filled 2022-09-13: qty 50

## 2022-09-14 ENCOUNTER — Ambulatory Visit (HOSPITAL_COMMUNITY): Admission: RE | Admit: 2022-09-14 | Payer: TRICARE For Life (TFL) | Source: Ambulatory Visit

## 2023-07-18 ENCOUNTER — Other Ambulatory Visit: Payer: Self-pay

## 2023-07-18 ENCOUNTER — Emergency Department (HOSPITAL_COMMUNITY): Payer: Medicare Other

## 2023-07-18 ENCOUNTER — Emergency Department (HOSPITAL_COMMUNITY)
Admission: EM | Admit: 2023-07-18 | Discharge: 2023-07-19 | Disposition: A | Discharge status: Home / Self Care | Payer: Medicare Other | Attending: Emergency Medicine | Admitting: Emergency Medicine

## 2023-07-18 DIAGNOSIS — B974 Respiratory syncytial virus as the cause of diseases classified elsewhere: Secondary | ICD-10-CM | POA: Diagnosis not present

## 2023-07-18 DIAGNOSIS — G20C Parkinsonism, unspecified: Secondary | ICD-10-CM | POA: Insufficient documentation

## 2023-07-18 DIAGNOSIS — Z7982 Long term (current) use of aspirin: Secondary | ICD-10-CM | POA: Insufficient documentation

## 2023-07-18 DIAGNOSIS — Z7951 Long term (current) use of inhaled steroids: Secondary | ICD-10-CM | POA: Diagnosis not present

## 2023-07-18 DIAGNOSIS — N3000 Acute cystitis without hematuria: Secondary | ICD-10-CM

## 2023-07-18 DIAGNOSIS — R059 Cough, unspecified: Secondary | ICD-10-CM | POA: Insufficient documentation

## 2023-07-18 DIAGNOSIS — F0390 Unspecified dementia without behavioral disturbance: Secondary | ICD-10-CM | POA: Insufficient documentation

## 2023-07-18 DIAGNOSIS — I10 Essential (primary) hypertension: Secondary | ICD-10-CM | POA: Diagnosis not present

## 2023-07-18 DIAGNOSIS — R441 Visual hallucinations: Secondary | ICD-10-CM

## 2023-07-18 DIAGNOSIS — J210 Acute bronchiolitis due to respiratory syncytial virus: Secondary | ICD-10-CM

## 2023-07-18 DIAGNOSIS — R051 Acute cough: Secondary | ICD-10-CM

## 2023-07-18 DIAGNOSIS — J449 Chronic obstructive pulmonary disease, unspecified: Secondary | ICD-10-CM | POA: Diagnosis not present

## 2023-07-18 DIAGNOSIS — Z79899 Other long term (current) drug therapy: Secondary | ICD-10-CM | POA: Diagnosis not present

## 2023-07-18 DIAGNOSIS — R509 Fever, unspecified: Secondary | ICD-10-CM | POA: Diagnosis present

## 2023-07-18 DIAGNOSIS — Z20822 Contact with and (suspected) exposure to covid-19: Secondary | ICD-10-CM | POA: Insufficient documentation

## 2023-07-18 DIAGNOSIS — E119 Type 2 diabetes mellitus without complications: Secondary | ICD-10-CM | POA: Diagnosis not present

## 2023-07-18 LAB — URINALYSIS, W/ REFLEX TO CULTURE (INFECTION SUSPECTED)
Bilirubin Urine: NEGATIVE
Glucose, UA: NEGATIVE mg/dL
Ketones, ur: NEGATIVE mg/dL
Nitrite: POSITIVE — AB
Protein, ur: NEGATIVE mg/dL
Specific Gravity, Urine: 1.013 (ref 1.005–1.030)
pH: 6 (ref 5.0–8.0)

## 2023-07-18 LAB — CBC WITH DIFFERENTIAL/PLATELET
Abs Immature Granulocytes: 0.02 10*3/uL (ref 0.00–0.07)
Basophils Absolute: 0 10*3/uL (ref 0.0–0.1)
Basophils Relative: 1 %
Eosinophils Absolute: 0.1 10*3/uL (ref 0.0–0.5)
Eosinophils Relative: 1 %
HCT: 34.3 % — ABNORMAL LOW (ref 36.0–46.0)
Hemoglobin: 12 g/dL (ref 12.0–15.0)
Immature Granulocytes: 0 %
Lymphocytes Relative: 26 %
Lymphs Abs: 1.3 10*3/uL (ref 0.7–4.0)
MCH: 34.2 pg — ABNORMAL HIGH (ref 26.0–34.0)
MCHC: 35 g/dL (ref 30.0–36.0)
MCV: 97.7 fL (ref 80.0–100.0)
Monocytes Absolute: 0.8 10*3/uL (ref 0.1–1.0)
Monocytes Relative: 15 %
Neutro Abs: 2.8 10*3/uL (ref 1.7–7.7)
Neutrophils Relative %: 57 %
Platelets: 197 10*3/uL (ref 150–400)
RBC: 3.51 MIL/uL — ABNORMAL LOW (ref 3.87–5.11)
RDW: 12.9 % (ref 11.5–15.5)
WBC: 5 10*3/uL (ref 4.0–10.5)
nRBC: 0 % (ref 0.0–0.2)

## 2023-07-18 LAB — COMPREHENSIVE METABOLIC PANEL
ALT: <5 U/L (ref 0–44)
AST: 17 U/L (ref 15–41)
Albumin: 3.3 g/dL — ABNORMAL LOW (ref 3.5–5.0)
Alkaline Phosphatase: 67 U/L (ref 38–126)
Anion gap: 8 (ref 5–15)
BUN: 8 mg/dL (ref 8–23)
CO2: 26 mmol/L (ref 22–32)
Calcium: 8.9 mg/dL (ref 8.9–10.3)
Chloride: 106 mmol/L (ref 98–111)
Creatinine, Ser: 0.68 mg/dL (ref 0.44–1.00)
GFR, Estimated: >60 mL/min (ref >60)
Glucose, Bld: 142 mg/dL — ABNORMAL HIGH (ref 70–99)
Potassium: 3.8 mmol/L (ref 3.5–5.1)
Sodium: 140 mmol/L (ref 135–145)
Total Bilirubin: 0.6 mg/dL (ref 0.0–1.2)
Total Protein: 6.3 g/dL — ABNORMAL LOW (ref 6.5–8.1)

## 2023-07-18 LAB — CBG MONITORING, ED: Glucose-Capillary: 134 mg/dL — ABNORMAL HIGH (ref 70–99)

## 2023-07-18 LAB — RESP PANEL BY RT-PCR (RSV, FLU A&B, COVID)  RVPGX2
Influenza A by PCR: NEGATIVE
Influenza B by PCR: NEGATIVE
Resp Syncytial Virus by PCR: POSITIVE — AB
SARS Coronavirus 2 by RT PCR: NEGATIVE

## 2023-07-18 MED ORDER — NITROFURANTOIN MONOHYD MACRO 100 MG PO CAPS: 100.0000 mg | ORAL_CAPSULE | Freq: Two times a day (BID) | ORAL | 0 refills | Status: DC

## 2023-07-18 MED ORDER — NITROFURANTOIN MONOHYD MACRO 100 MG PO CAPS
100.0000 mg | ORAL_CAPSULE | Freq: Once | ORAL | Status: AC
  Administered 2023-07-18: 100 mg via ORAL
  Filled 2023-07-18: qty 1

## 2023-07-18 NOTE — ED Notes (Signed)
Daughter updated on the phone by this RN.

## 2023-07-18 NOTE — Discharge Instructions (Signed)
 You were seen for RSV and your urinary tract infection in the emergency department.   At home, please take the antibiotics that we have prescribed you for your urinary tract infection.  You will likely experience some confusion over the next few days until these infections resolved.  Check your MyChart online for the results of any tests that had not resulted by the time you left the emergency department.   Follow-up with your primary doctor in 2-3 days regarding your visit.    Return immediately to the emergency department if you experience any of the following: Difficulty breathing, or any other concerning symptoms.    Thank you for visiting our Emergency Department. It was a pleasure taking care of you today.

## 2023-07-18 NOTE — ED Triage Notes (Signed)
 Pt BIB [facility] from [facility] d/t 3 days of noting change in behaviors & mental status. Staff reports visual hallucinations, lethargy & a cough for a few days as well. Does have Hx of dementia, Parkinson's Disease & DM. A/Ox1 to 2 intermittently to self & location. Leans/contracted to the Rt at baseline, also sticks her tongue out that is a new change noted. 80/44 (manual) with 500cc fluid, then was 107/51, 80 bpm, 23 resp, 98.1, 98% RA, CBG 209, also has a colostomy bag & PIV 20g Rt AC.

## 2023-07-18 NOTE — ED Provider Notes (Signed)
  Physical Exam  BP (!) 144/61   Pulse 89   Temp 98.8 F (37.1 C) (Oral)   Resp (!) 21   SpO2 99%   Physical Exam  Procedures  Procedures  ED Course / MDM   Clinical Course as of 07/18/23 1841  Wed Jul 18, 2023  1522 Assumed care. 83 yo F with dementia (AAOX1), parkinson's with R sided contractures, COPD and DM who presented with AMS. Was hallucinating today. Has been coughing. Was hypotensive for EMS given 500 ml of fluids and now normotensive. Now AAOx2. Does have some URI symptoms. Still hallucinating what's that on the wall? Suspect UTI or covid. May be able to be dc'd.  [RP]  1624 Respiratory Syncytial Virus by PCR(!): POSITIVE [RP]  1757 Calling [facility].  No response.  Attempted to contact spouse without response as well. [RP]  1840 Called the patient's daughter to give her an update.  Appears the patient has RSV as well as a urinary tract infection.  She is alert and oriented x 2 for me.  Did not know the date.  This appears to be consistent with her baseline.  After attempting to call [facility] multiple times did discuss disposition with the patient's daughter.  It appears that they are able to care for her safely there and so we will discharge her back to her facility with this prescription for Macrobid . [RP]    Clinical Course User Index [RP] Yolande Lamar BROCKS, MD   Medical Decision Making Amount and/or Complexity of Data Reviewed Labs: ordered. Decision-making details documented in ED Course. Radiology: ordered.  Risk Prescription drug management.      Yolande Lamar BROCKS, MD 07/18/23 [phone]

## 2023-07-18 NOTE — ED Provider Notes (Signed)
 [place] EMERGENCY DEPARTMENT AT [facility] Provider Note   CSN: [id number] Arrival date & time: 07/18/23  1351     History  Chief Complaint  Patient presents with   Altered Mental Status    Alyssa Boyd is a 83 y.o. female.  Patient is an 83 year old female with a past medical history of COPD, dementia ANO x 1-2 at baseline, Parkinson's, hypertension, diabetes presenting to the emergency department with a cough and altered mental status.  Per EMS, the patient has been having visual hallucinations and cough for the last several days.  Patient does report that she has been coughing more frequently and has associated congestion.  She denies significant shortness of breath and denies any chest pain.  She states that she has felt feverish at home.  She also reports that she has had dysuria but denies any hematuria.  In the room, the patient did point to the wall and ask what is that on the wall when nothing was there.  The history is provided by the patient.  Altered Mental Status      Home Medications Prior to Admission medications   Medication Sig Start Date End Date Taking? Authorizing Provider  albuterol  (VENTOLIN  HFA) 108 (90 Base) MCG/ACT inhaler Inhale 2 puffs into the lungs every 6 (six) hours as needed for wheezing or shortness of breath.   Yes [provider]  aspirin  EC 81 MG tablet Take 81 mg by mouth daily. Swallow whole.   Yes [provider]  atorvastatin  (LIPITOR) 20 MG tablet Take 20 mg by mouth daily.   Yes [provider]  carbidopa -levodopa  (SINEMET  IR) 25-100 MG tablet Take 1 tablet by mouth 4 (four) times daily.   Yes [provider]  Dextromethorphan-Benzocaine (CEPACOL SORE THROAT & COUGH) 5-7.5 MG LOZG Use as directed 1 lozenge in the mouth or throat every 4 (four) hours as needed (sore throat, cough).   Yes [provider]  escitalopram (LEXAPRO) 10 MG tablet Take 10 mg by mouth daily.   Yes [provider]  famotidine (PEPCID) 20 MG tablet Take 20 mg by mouth 2 (two) times daily.   Yes [provider]  fluticasone  (FLONASE ) 50 MCG/ACT nasal spray Place 2 sprays into both nostrils at bedtime.   Yes [provider]  fluticasone -salmeterol (ADVAIR) 250-50 MCG/ACT AEPB Inhale 1 puff into the lungs in the morning and at bedtime.   Yes [provider]  furosemide  (LASIX ) 20 MG tablet Take 10 mg by mouth daily.   Yes [provider]  gabapentin (NEURONTIN) 100 MG capsule Take 200 mg by mouth in the morning and at bedtime.   Yes [provider]  lidocaine  4 % Place 2 patches onto the skin daily. For bilateral quadriceps   Yes [provider]  loratadine (CLARITIN) 10 MG tablet Take 10 mg by mouth daily.   Yes [provider]  LORazepam (ATIVAN) 0.5 MG tablet Take 0.5 mg by mouth 2 (two) times daily.   Yes [provider]  memantine (NAMENDA) 5 MG tablet Take 5 mg by mouth 2 (two) times daily. 07/09/23  Yes [provider]  Menthol, Topical Analgesic, (BIOFREEZE COOL THE PAIN) 4 % GEL Apply 1 Application topically in the morning, at noon, and at bedtime.   Yes [provider]  busPIRone  (BUSPAR ) 5 MG tablet Take 5 mg by mouth 2 (two) times daily.    [provider]  Calcium  Carbonate-Vitamin D (CALCIUM -VITAMIN D3 PO) Take 5 mg  by mouth daily.    [provider]  Cholecalciferol (VITAMIN D3) 50 MCG (2000 UT) CHEW Chew 50 mcg by mouth daily.    [provider]  docusate sodium  (COLACE) 100 MG capsule Take 1 capsule (100 mg total) by mouth 2 (two) times daily. 09/06/22   [place], Camellia PARAS, DO  glycerin  adult 2 g suppository Place 1 suppository rectally as needed for constipation. 06/22/22   Ladora Congress, PA  melatonin 3 MG TABS tablet Take 3 mg by mouth at bedtime.    [provider]  methocarbamol  (ROBAXIN ) 500 MG tablet Take 500 mg by mouth 2 (two) times daily.    [provider]  metoprolol  tartrate (LOPRESSOR ) 50 MG tablet Take 50 mg by mouth 2 (two) times daily. No mg listed on mar    [provider]  montelukast  (SINGULAIR ) 10 MG tablet Take 10 mg by mouth at bedtime.    [provider]  Multiple Vitamins-Minerals (MULTI COMPLETE PO) Take 1 tablet by mouth daily.    [provider]  polyethylene glycol (MIRALAX  / GLYCOLAX ) 17 g packet Take 17 g by mouth 2 (two) times daily. 09/06/22   [place], Eric J, DO  psyllium (REGULOID) 0.52 g capsule Take 0.52 g by mouth at bedtime.    [provider]  rOPINIRole  (REQUIP  XL) 8 MG 24 hr tablet Take 8 mg by mouth at bedtime.    [provider]  senna (SENOKOT) 8.6 MG TABS tablet Take 1 tablet (8.6 mg total) by mouth daily. 09/07/22   [place], Eric J, DO  telmisartan (MICARDIS) 40 MG tablet Take 40 mg by mouth daily.    [provider]  Tiotropium Bromide Monohydrate (SPIRIVA RESPIMAT) 2.5 MCG/ACT AERS Inhale 2 puffs into the lungs daily.    [provider]  traMADol  (ULTRAM ) 50 MG tablet Take 1 tablet (50 mg total) by mouth every 6 (six) hours as needed for moderate pain. 09/06/22   [place], Camellia PARAS, DO  Vitamin D, Ergocalciferol, (DRISDOL) 1.25 MG (50000 UNIT) CAPS capsule Take 50,000 Units by mouth once a week.    [provider]      Allergies    Shellfish allergy    Review of Systems   Review of Systems  Physical Exam Updated Vital Signs BP (!) 149/72   Pulse 93   Temp 98.1 F (36.7 C)   Resp 19   SpO2 100%  Physical Exam Vitals and nursing note reviewed.  Constitutional:      General: She is not in acute distress.    Appearance: Normal appearance.  HENT:     Head: Normocephalic and atraumatic.     Nose: Nose normal.     Mouth/Throat:     Mouth: Mucous membranes are moist.     Pharynx: Oropharynx is clear.  Eyes:     Extraocular Movements: Extraocular movements intact.     Conjunctiva/sclera: Conjunctivae normal.   Cardiovascular:     Rate and Rhythm: Normal rate and regular rhythm.     Heart sounds: Normal heart sounds.  Pulmonary:     Effort: Pulmonary effort is normal.     Breath sounds: Normal breath sounds.  Abdominal:     General: Abdomen is flat.     Palpations: Abdomen is soft.     Tenderness: There is no abdominal tenderness.     Comments: Colostomy bag in place, no stool in bag  Musculoskeletal:        General: Normal range of motion.  Cervical back: Normal range of motion.  Skin:    General: Skin is warm and dry.  Neurological:     General: No focal deficit present.     Mental Status: She is alert.     Comments: Oriented to person and place  Psychiatric:        Mood and Affect: Mood normal.        Behavior: Behavior normal.     ED Results / Procedures / Treatments   Labs (all labs ordered are listed, but only abnormal results are displayed) Labs Reviewed  COMPREHENSIVE METABOLIC PANEL - Abnormal; Notable for the following components:      Result Value   Glucose, Bld 142 (*)    Total Protein 6.3 (*)    Albumin  3.3 (*)    All other components within normal limits  CBC WITH DIFFERENTIAL/PLATELET - Abnormal; Notable for the following components:   RBC 3.51 (*)    HCT 34.3 (*)    MCH 34.2 (*)    All other components within normal limits  CBG MONITORING, ED - Abnormal; Notable for the following components:   Glucose-Capillary 134 (*)    All other components within normal limits  RESP PANEL BY RT-PCR (RSV, FLU A&B, COVID)  [id number]  URINALYSIS, W/ REFLEX TO CULTURE (INFECTION SUSPECTED)    EKG EKG Interpretation Date/Time:  Wednesday July 18 2023 14:12:38 EST Ventricular Rate:  85 PR Interval:  185 QRS Duration:  111 QT Interval:  354 QTC Calculation: 421 R Axis:   6  Text Interpretation: Normal sinus rhythm Low voltage, extremity leads Repolarization abnormality, prob rate related Artifact in lead(s) II aVR aVF V2 V3 V4 V5 V6  Confirmed by Ellouise Fine  ([id number]) on 07/18/2023 2:17:03 PM  Radiology DG Chest Port 1 View Result Date: 07/18/2023 CLINICAL DATA:  Cough. EXAM: PORTABLE CHEST 1 VIEW COMPARISON:  08/29/2022. FINDINGS: Examination of right lung apex is nondiagnostic due to overlapping mandible. Bilateral lung fields are grossly clear. Bilateral costophrenic angles are clear. Stable cardio-mediastinal silhouette. No acute osseous abnormalities. Probable subacute anterior left seventh rib fracture noted. The soft tissues are within normal limits. IMPRESSION: No active disease. Electronically Signed   By: Ree Molt M.D.   On: 07/18/2023 15:40   CT Head Wo Contrast Result Date: 07/18/2023 CLINICAL DATA:  Mental status change, unknown cause EXAM: CT HEAD WITHOUT CONTRAST TECHNIQUE: Contiguous axial images were obtained from the base of the skull through the vertex without intravenous contrast. RADIATION DOSE REDUCTION: This exam was performed according to the departmental dose-optimization program which includes automated exposure control, adjustment of the mA and/or kV according to patient size and/or use of iterative reconstruction technique. COMPARISON:  Brain MR 09/20/2021 FINDINGS: Brain: No hemorrhage. No hydrocephalus. No extra-axial fluid collection. No CT evidence of an acute cortical infarct. No mass effect. No mass lesion. Vascular: No hyperdense vessel or unexpected calcification. Skull: Normal. Negative for fracture or focal lesion. Sinuses/Orbits: No middle ear or mastoid effusion. Mucosal thickening bilateral maxillary sinuses. Right lens replacement. Orbits are otherwise unremarkable. Other: None. IMPRESSION: No acute intracranial abnormality. Electronically Signed   By: Lyndall Gore M.D.   On: 07/18/2023 15:30    Procedures Procedures    Medications Ordered in ED Medications - No data to display  ED Course/ Medical Decision Making/ A&P Clinical Course as of 07/18/23 1601  Wed Jul 18, 2023  1522 Assumed care. 83 yo F with  dementia (AAOX1), parkinson's with R sided contractures, COPD and DM who presented with  AMS. Was hallucinating today. Has been coughing. Was hypotensive for EMS given 500 ml of fluids and now normotensive. Now AAOx2. Does have some URI symptoms. Still hallucinating what's that on the wall? Suspect UTI or covid. May be able to be dc'd.  [RP]    Clinical Course User Index [RP] Yolande Lamar BROCKS, MD                                 Medical Decision Making This patient presents to the ED with chief complaint(s) of AMS, cough with pertinent past medical history of dementia, Parkinson's, COPD, HTN, DM which further complicates the presenting complaint. The complaint involves an extensive differential diagnosis and also carries with it a high risk of complications and morbidity.    The differential diagnosis includes pneumonia, pneumothorax, pulmonary edema, pleural effusion, UTI, viral syndrome, other infection, patient did report that she had a headache prior to the symptoms starting though currently is asymptomatic considering possible ICH or mass effect as a cause of her hallucinations  Additional history obtained: Additional history obtained from EMS  Records reviewed Nursing Home Documents  ED Course and Reassessment: On patient's arrival she is hemodynamically stable in no acute distress, does appear to be at her neurologic baseline though was having some visual hallucinations while in the room.  EKG on arrival showed no acute ischemic changes.  The patient will have labs including viral swab and urine as well as head CT and chest x-ray to further evaluate for cause of her cough and hallucinations and will be closely reassessed.  Independent labs interpretation:  The following labs were independently interpreted: within normal range, UA and viral swab pending  Independent visualization of imaging: - I independently visualized the following imaging with scope of interpretation limited to  determining acute life threatening conditions related to emergency care: CXR, CTH, which revealed no acute disease     Amount and/or Complexity of Data Reviewed Labs: ordered. Radiology: ordered.          Final Clinical Impression(s) / ED Diagnoses Final diagnoses:  None    Rx / DC Orders ED Discharge Orders     None         Kingsley, Carlos Heber K, DO 07/18/23 1601

## 2023-07-18 NOTE — ED Notes (Signed)
 Patient transported to CT

## 2023-07-19 MED ORDER — ACETAMINOPHEN 500 MG PO TABS
1000.0000 mg | ORAL_TABLET | ORAL | Status: AC
  Administered 2023-07-19: 1000 mg via ORAL
  Filled 2023-07-19: qty 2

## 2023-07-19 NOTE — ED Notes (Signed)
 [facility] called, pt not on transport list, transport scheduled at this time.

## 2023-07-19 NOTE — ED Provider Notes (Signed)
 Patient stable waiting for transfer to Rehab this morning.   Physical Exam  BP (!) 142/69 (BP Location: Right Arm)   Pulse 100   Temp 98.7 F (37.1 C) (Oral)   Resp 19   SpO2 97%   Physical Exam  Procedures  Procedures  ED Course / MDM   Clinical Course as of 07/19/23 0853  Wed Jul 18, 2023  1522 Assumed care. 83 yo F with dementia (AAOX1), parkinson's with R sided contractures, COPD and DM who presented with AMS. Was hallucinating today. Has been coughing. Was hypotensive for EMS given 500 ml of fluids and now normotensive. Now AAOx2. Does have some URI symptoms. Still hallucinating what's that on the wall? Suspect UTI or covid. May be able to be dc'd.  [RP]  1624 Respiratory Syncytial Virus by PCR(!): POSITIVE [RP]  1757 Calling [facility].  No response.  Attempted to contact spouse without response as well. [RP]  1840 Called the patient's daughter to give her an update.  Appears the patient has RSV as well as a urinary tract infection.  She is alert and oriented x 2 for me.  Did not know the date.  This appears to be consistent with her baseline.  After attempting to call [facility] multiple times did discuss disposition with the patient's daughter.  It appears that they are able to care for her safely there and so we will discharge her back to her facility with this prescription for Macrobid . [RP]    Clinical Course User Index [RP] Yolande Lamar BROCKS, MD   Medical Decision Making Amount and/or Complexity of Data Reviewed Labs: ordered. Decision-making details documented in ED Course. Radiology: ordered.  Risk OTC drugs. Prescription drug management.   Patient in no distress and overall condition improved here in the ED. Detailed discussions were had with the patient regarding current findings, and need for rehab. Calling for transfer this morning.       Elnor Hila P, DO 07/19/23 [phone]

## 2023-07-20 LAB — URINE CULTURE: Culture: >=100000 — AB

## 2023-07-21 ENCOUNTER — Telehealth (HOSPITAL_BASED_OUTPATIENT_CLINIC_OR_DEPARTMENT_OTHER): Payer: Self-pay | Admitting: *Deleted

## 2023-07-21 NOTE — Progress Notes (Signed)
 ED Antimicrobial Stewardship Positive Culture Follow Up   Alyssa Boyd is an 83 y.o. female who presented to [facility] on 07/18/2023 with a chief complaint of  Chief Complaint  Patient presents with   Altered Mental Status    Recent Results (from the past 720 hours)  Resp panel by RT-PCR (RSV, Flu A&B, Covid) Anterior Nasal Swab     Status: Abnormal   Collection Time: 07/18/23  2:22 PM   Specimen: Anterior Nasal Swab  Result Value Ref Range Status   SARS Coronavirus 2 by RT PCR NEGATIVE NEGATIVE Final   Influenza A by PCR NEGATIVE NEGATIVE Final   Influenza B by PCR NEGATIVE NEGATIVE Final    Comment: (NOTE) The Xpert Xpress SARS-CoV-2/FLU/RSV plus assay is intended as an aid in the diagnosis of influenza from Nasopharyngeal swab specimens and should not be used as a sole basis for treatment. Nasal washings and aspirates are unacceptable for Xpert Xpress SARS-CoV-2/FLU/RSV testing.  Fact Sheet for Patients: bloggercourse.com  Fact Sheet for Healthcare Providers: seriousbroker.it  This test is not yet approved or cleared by the [place]  FDA and has been authorized for detection and/or diagnosis of SARS-CoV-2 by FDA under an Emergency Use Authorization (EUA). This EUA will remain in effect (meaning this test can be used) for the duration of the COVID-19 declaration under Section 564(b)(1) of the Act, 21 U.S.C. section 360bbb-3(b)(1), unless the authorization is terminated or revoked.     Resp Syncytial Virus by PCR POSITIVE (A) NEGATIVE Final    Comment: (NOTE) Fact Sheet for Patients: bloggercourse.com  Fact Sheet for Healthcare Providers: seriousbroker.it  This test is not yet approved or cleared by the [place]  FDA and has been authorized for detection and/or diagnosis of SARS-CoV-2 by FDA under an Emergency Use Authorization (EUA). This EUA will remain in  effect (meaning this test can be used) for the duration of the COVID-19 declaration under Section 564(b)(1) of the Act, 21 U.S.C. section 360bbb-3(b)(1), unless the authorization is terminated or revoked.  Performed at [facility], [address], [place], [place] [address]   Urine Culture     Status: Abnormal   Collection Time: 07/18/23  5:43 PM   Specimen: Urine, Random  Result Value Ref Range Status   Specimen Description URINE, RANDOM  Final   Special Requests   Final    URINE, CLEAN CATCH Performed at [facility], [address], [place], [place] [address]    Culture >=100,000 COLONIES/mL KLEBSIELLA PNEUMONIAE (A)  Final   Report Status 07/20/2023 FINAL  Final   Organism ID, Bacteria KLEBSIELLA PNEUMONIAE (A)  Final      Susceptibility   Klebsiella pneumoniae - MIC*    AMPICILLIN RESISTANT Resistant     CEFAZOLIN <=4 SENSITIVE Sensitive     CEFEPIME  <=0.12 SENSITIVE Sensitive     CEFTRIAXONE <=0.25 SENSITIVE Sensitive     CIPROFLOXACIN <=0.25 SENSITIVE Sensitive     GENTAMICIN <=1 SENSITIVE Sensitive     IMIPENEM <=0.25 SENSITIVE Sensitive     NITROFURANTOIN  64 INTERMEDIATE Intermediate     TRIMETH/SULFA <=20 SENSITIVE Sensitive     AMPICILLIN/SULBACTAM <=2 SENSITIVE Sensitive     PIP/TAZO <=4 SENSITIVE Sensitive ug/mL    * >=100,000 COLONIES/mL KLEBSIELLA PNEUMONIAE    [x]  Treated with Macrobid , organism resistant to prescribed antimicrobial []  Patient discharged originally without antimicrobial agent and treatment is now indicated  New antibiotic prescription: Fosfomycin  ED Provider: Rolan Quale, DO   Dorn Poot 07/21/2023, 11:09 AM  Clinical Pharmacist Monday - Friday phone -  [phone] Saturday - Sunday phone - [phone]

## 2023-07-21 NOTE — Telephone Encounter (Signed)
 Post ED Visit - Positive Culture Follow-up: Successful Patient Follow-Up  Culture assessed and recommendations reviewed by:  []  Rankin Dee, Pharm.D. []  Venetia Gully, Pharm.D., BCPS AQ-ID []  Garrel Crews, Pharm.D., BCPS []  Almarie Lunger, [address].D., BCPS []  [place], [address].D., BCPS, AAHIVP []  Rosaline Bihari, Pharm.D., BCPS, AAHIVP []  Vernell Meier, PharmD, BCPS []  Latanya Hint, PharmD, BCPS []  Donald Medley, PharmD, BCPS [x] Dorn Poot, PharmD  Positive urine culture  []  Patient discharged without antimicrobial prescription and treatment is now indicated [x]  Organism is resistant to prescribed ED discharge antimicrobial []  Patient with positive blood cultures  Changes discussed with ED provider: Rolan Quale, DO  New antibiotic prescription Fosfomycin 3gm PO x 1  Faxed to [facility], [place]  Contacted facility, date 07/21/23, time 1130   Alyssa Boyd 07/21/2023, 11:31 AM

## 2024-07-30 ENCOUNTER — Emergency Department (HOSPITAL_COMMUNITY)

## 2024-07-30 ENCOUNTER — Inpatient Hospital Stay (HOSPITAL_COMMUNITY)
Admission: EM | Admit: 2024-07-30 | Discharge: 2024-08-02 | DRG: 871 | Disposition: A | Source: Skilled Nursing Facility | Attending: Internal Medicine | Admitting: Internal Medicine

## 2024-07-30 DIAGNOSIS — R627 Adult failure to thrive: Secondary | ICD-10-CM | POA: Diagnosis present

## 2024-07-30 DIAGNOSIS — Z7401 Bed confinement status: Secondary | ICD-10-CM

## 2024-07-30 DIAGNOSIS — J189 Pneumonia, unspecified organism: Secondary | ICD-10-CM

## 2024-07-30 DIAGNOSIS — A419 Sepsis, unspecified organism: Principal | ICD-10-CM | POA: Diagnosis present

## 2024-07-30 DIAGNOSIS — I10 Essential (primary) hypertension: Secondary | ICD-10-CM | POA: Diagnosis present

## 2024-07-30 DIAGNOSIS — E119 Type 2 diabetes mellitus without complications: Secondary | ICD-10-CM | POA: Diagnosis present

## 2024-07-30 DIAGNOSIS — R4182 Altered mental status, unspecified: Principal | ICD-10-CM

## 2024-07-30 DIAGNOSIS — E860 Dehydration: Secondary | ICD-10-CM | POA: Diagnosis present

## 2024-07-30 DIAGNOSIS — Z881 Allergy status to other antibiotic agents status: Secondary | ICD-10-CM

## 2024-07-30 DIAGNOSIS — Z7982 Long term (current) use of aspirin: Secondary | ICD-10-CM

## 2024-07-30 DIAGNOSIS — J440 Chronic obstructive pulmonary disease with acute lower respiratory infection: Secondary | ICD-10-CM | POA: Diagnosis present

## 2024-07-30 DIAGNOSIS — E872 Acidosis, unspecified: Secondary | ICD-10-CM | POA: Diagnosis present

## 2024-07-30 DIAGNOSIS — E870 Hyperosmolality and hypernatremia: Secondary | ICD-10-CM | POA: Diagnosis present

## 2024-07-30 DIAGNOSIS — R6521 Severe sepsis with septic shock: Secondary | ICD-10-CM | POA: Diagnosis present

## 2024-07-30 DIAGNOSIS — E8809 Other disorders of plasma-protein metabolism, not elsewhere classified: Secondary | ICD-10-CM | POA: Diagnosis present

## 2024-07-30 DIAGNOSIS — Z7951 Long term (current) use of inhaled steroids: Secondary | ICD-10-CM

## 2024-07-30 DIAGNOSIS — Z79899 Other long term (current) drug therapy: Secondary | ICD-10-CM

## 2024-07-30 DIAGNOSIS — Z66 Do not resuscitate: Secondary | ICD-10-CM | POA: Diagnosis not present

## 2024-07-30 DIAGNOSIS — G9341 Metabolic encephalopathy: Secondary | ICD-10-CM | POA: Diagnosis present

## 2024-07-30 DIAGNOSIS — F419 Anxiety disorder, unspecified: Secondary | ICD-10-CM | POA: Diagnosis present

## 2024-07-30 DIAGNOSIS — E785 Hyperlipidemia, unspecified: Secondary | ICD-10-CM | POA: Diagnosis present

## 2024-07-30 DIAGNOSIS — Z515 Encounter for palliative care: Secondary | ICD-10-CM

## 2024-07-30 DIAGNOSIS — R0902 Hypoxemia: Secondary | ICD-10-CM | POA: Diagnosis present

## 2024-07-30 DIAGNOSIS — F0280 Dementia in other diseases classified elsewhere without behavioral disturbance: Secondary | ICD-10-CM | POA: Diagnosis present

## 2024-07-30 DIAGNOSIS — Z1152 Encounter for screening for COVID-19: Secondary | ICD-10-CM

## 2024-07-30 DIAGNOSIS — Z933 Colostomy status: Secondary | ICD-10-CM

## 2024-07-30 DIAGNOSIS — Z91013 Allergy to seafood: Secondary | ICD-10-CM

## 2024-07-30 DIAGNOSIS — G20A1 Parkinson's disease without dyskinesia, without mention of fluctuations: Secondary | ICD-10-CM | POA: Diagnosis present

## 2024-07-30 DIAGNOSIS — K219 Gastro-esophageal reflux disease without esophagitis: Secondary | ICD-10-CM | POA: Diagnosis present

## 2024-07-30 DIAGNOSIS — D539 Nutritional anemia, unspecified: Secondary | ICD-10-CM | POA: Diagnosis present

## 2024-07-30 DIAGNOSIS — N179 Acute kidney failure, unspecified: Secondary | ICD-10-CM | POA: Diagnosis present

## 2024-07-30 DIAGNOSIS — Z9049 Acquired absence of other specified parts of digestive tract: Secondary | ICD-10-CM

## 2024-07-30 LAB — CBC WITH DIFFERENTIAL/PLATELET
Abs Immature Granulocytes: 0.13 K/uL — ABNORMAL HIGH (ref 0.00–0.07)
Basophils Absolute: 0 K/uL (ref 0.0–0.1)
Basophils Relative: 0 %
Eosinophils Absolute: 0 K/uL (ref 0.0–0.5)
Eosinophils Relative: 0 %
HCT: 42.7 % (ref 36.0–46.0)
Hemoglobin: 13 g/dL (ref 12.0–15.0)
Immature Granulocytes: 1 %
Lymphocytes Relative: 14 %
Lymphs Abs: 2 K/uL (ref 0.7–4.0)
MCH: 32.3 pg (ref 26.0–34.0)
MCHC: 30.4 g/dL (ref 30.0–36.0)
MCV: 106.2 fL — ABNORMAL HIGH (ref 80.0–100.0)
Monocytes Absolute: 1 K/uL (ref 0.1–1.0)
Monocytes Relative: 7 %
Neutro Abs: 10.5 K/uL — ABNORMAL HIGH (ref 1.7–7.7)
Neutrophils Relative %: 78 %
Platelets: 268 K/uL (ref 150–400)
RBC: 4.02 MIL/uL (ref 3.87–5.11)
RDW: 13.6 % (ref 11.5–15.5)
WBC: 13.6 K/uL — ABNORMAL HIGH (ref 4.0–10.5)
nRBC: 0 % (ref 0.0–0.2)

## 2024-07-30 LAB — PROTIME-INR
INR: 1.3 — ABNORMAL HIGH (ref 0.8–1.2)
Prothrombin Time: 16.5 s — ABNORMAL HIGH (ref 11.4–15.2)

## 2024-07-30 LAB — I-STAT CG4 LACTIC ACID, ED: Lactic Acid, Venous: 2.3 mmol/L (ref 0.5–1.9)

## 2024-07-30 MED ORDER — NOREPINEPHRINE 4 MG/250ML-% IV SOLN
0.0000 ug/min | INTRAVENOUS | Status: DC
  Administered 2024-07-30: 2 ug/min via INTRAVENOUS

## 2024-07-30 MED ORDER — METRONIDAZOLE 500 MG/100ML IV SOLN
500.0000 mg | Freq: Once | INTRAVENOUS | Status: AC
  Administered 2024-07-31: 500 mg via INTRAVENOUS
  Filled 2024-07-30: qty 100

## 2024-07-30 MED ORDER — LACTATED RINGERS IV SOLN: INTRAVENOUS | Status: DC

## 2024-07-30 MED ORDER — SODIUM CHLORIDE 0.9 % IV SOLN
2.0000 g | Freq: Once | INTRAVENOUS | Status: AC
  Administered 2024-07-31: 2 g via INTRAVENOUS
  Filled 2024-07-30: qty 10

## 2024-07-30 MED ORDER — LACTATED RINGERS IV BOLUS (SEPSIS)
1000.0000 mL | Freq: Once | INTRAVENOUS | Status: AC
  Administered 2024-07-31: 1000 mL via INTRAVENOUS

## 2024-07-30 MED ORDER — VANCOMYCIN HCL IN DEXTROSE 1-5 GM/200ML-% IV SOLN
1000.0000 mg | Freq: Once | INTRAVENOUS | Status: AC
  Administered 2024-07-31: 1000 mg via INTRAVENOUS
  Filled 2024-07-30: qty 200

## 2024-07-30 NOTE — ED Triage Notes (Signed)
 BIB [facility] from [facility]. Per facility pt had AMS today and decreased physical activity x2 weeks. Per EMS, on arrival initial BP was 48/20 and pt was unresponsive. EMS gave 20 mcg Epi en route.

## 2024-07-30 NOTE — ED Provider Notes (Incomplete)
 " [facility] Provider Note   CSN: [id number] Arrival date & time: 07/30/24  2313     Patient presents with: Altered Mental Status   Alyssa Boyd is a 84 y.o. female.  {Add pertinent medical, surgical, social history, OB history to YEP:67052} The history is provided by the patient and medical records.  Altered Mental Status  84 y.o. F with hx of Parkinson's disease, diabetes, COPD, hypertension, hyperlipidemia, presenting to the ED from nursing facility due to altered mental status.  Reportedly she has had decreased activity for the past 2 weeks.  Staff was unsure about oral intake, fever, or other changes.  Upon EMS arrival BP was 40s over 20s, she was given IV fluids and 20 mcg epi push.  Per facility staff, physicians have recommended hospice/comfort care but family was not ready to make that decision yet.  Prior to Admission medications  Medication Sig Start Date End Date Taking? Authorizing Provider  albuterol  (VENTOLIN  HFA) 108 (90 Base) MCG/ACT inhaler Inhale 2 puffs into the lungs every 6 (six) hours as needed for wheezing or shortness of breath.    [provider]  aspirin  EC 81 MG tablet Take 81 mg by mouth daily. Swallow whole.    [provider]  atorvastatin  (LIPITOR) 20 MG tablet Take 20 mg by mouth daily.    [provider]  busPIRone  (BUSPAR ) 5 MG tablet Take 5 mg by mouth 2 (two) times daily. Patient not taking: Reported on 07/18/2023    [provider]  Calcium  Carbonate-Vitamin D (CALCIUM -VITAMIN D3 PO) Take 5 mg by mouth daily.    [provider]  carbidopa -levodopa  (SINEMET  IR) 25-100 MG tablet Take 1 tablet by mouth 4 (four) times daily.    [provider]  Cholecalciferol (VITAMIN D3) 50 MCG (2000 UT) CHEW Chew 50 mcg by mouth daily. Patient not taking: Reported on 07/18/2023    [provider]  Dextromethorphan-Benzocaine (CEPACOL SORE THROAT & COUGH) 5-7.5 MG  LOZG Use as directed 1 lozenge in the mouth or throat every 4 (four) hours as needed (sore throat, cough).    [provider]  docusate sodium  (COLACE) 100 MG capsule Take 1 capsule (100 mg total) by mouth 2 (two) times daily. Patient not taking: Reported on 07/18/2023 09/06/22   [place], Camellia PARAS, DO  escitalopram (LEXAPRO) 10 MG tablet Take 10 mg by mouth daily.    [provider]  famotidine (PEPCID) 20 MG tablet Take 20 mg by mouth 2 (two) times daily.    [provider]  fluticasone  (FLONASE ) 50 MCG/ACT nasal spray Place 2 sprays into both nostrils at bedtime.    [provider]  fluticasone -salmeterol (ADVAIR) 250-50 MCG/ACT AEPB Inhale 1 puff into the lungs in the morning and at bedtime.    [provider]  furosemide  (LASIX ) 20 MG tablet Take 10 mg by mouth daily.    [provider]  gabapentin (NEURONTIN) 100 MG capsule Take 200 mg by mouth in the morning and at bedtime.    [provider]  glycerin  adult 2 g suppository Place 1 suppository rectally as needed for constipation. Patient not taking: Reported on 07/18/2023 06/22/22   Ladora Congress, PA  lidocaine  4 % Place 2 patches onto the skin daily. For bilateral quadriceps    [provider]  loratadine (CLARITIN) 10 MG tablet Take 10 mg by mouth daily.    [provider]  LORazepam  (ATIVAN ) 0.5 MG tablet Take 0.5 mg by  mouth 2 (two) times daily.    [provider]  LORazepam  (ATIVAN ) 0.5 MG tablet Take 0.25 mg by mouth at bedtime.    [provider]  melatonin 3 MG TABS tablet Take 3 mg by mouth at bedtime.    [provider]  memantine (NAMENDA) 5 MG tablet Take 5 mg by mouth 2 (two) times daily. 07/09/23   [provider]  Menthol, Topical Analgesic, (BIOFREEZE COOL THE PAIN) 4 % GEL Apply 1 Application topically in the morning, at noon, and at bedtime.    [provider]  methocarbamol  (ROBAXIN ) 500 MG tablet Take 500 mg by  mouth 2 (two) times daily.    [provider]  metoprolol  tartrate (LOPRESSOR ) 50 MG tablet Take 50 mg by mouth 2 (two) times daily. No mg listed on mar    [provider]  montelukast  (SINGULAIR ) 10 MG tablet Take 10 mg by mouth at bedtime.    [provider]  Multiple Vitamins-Minerals (MULTI COMPLETE PO) Take 1 tablet by mouth daily.    [provider]  nitrofurantoin , macrocrystal-monohydrate, (MACROBID ) 100 MG capsule Take 1 capsule (100 mg total) by mouth 2 (two) times daily. 07/18/23   Yolande Lamar BROCKS, MD  polyethylene glycol (MIRALAX  / GLYCOLAX ) 17 g packet Take 17 g by mouth 2 (two) times daily. Patient taking differently: Take 17 g by mouth daily. 09/06/22   [place], Eric J, DO  psyllium (REGULOID) 0.52 g capsule Take 0.52 g by mouth in the morning, at noon, and at bedtime.    [provider]  rOPINIRole  (REQUIP  XL) 2 MG 24 hr tablet Take 2 mg by mouth at bedtime.    [provider]  sennosides-docusate sodium  (SENOKOT-S) 8.6-50 MG tablet Take 2 tablets by mouth daily.    [provider]  simethicone (MYLICON) 80 MG chewable tablet Chew 80 mg by mouth 3 (three) times daily before meals.    [provider]  SPIRIVA HANDIHALER 18 MCG inhalation capsule Place 18 mcg into inhaler and inhale daily. 07/05/23   [provider]  telmisartan (MICARDIS) 40 MG tablet Take 40 mg by mouth daily.    [provider]  Tiotropium Bromide Monohydrate (SPIRIVA RESPIMAT) 2.5 MCG/ACT AERS Inhale 2 puffs into the lungs daily. Patient not taking: Reported on 07/18/2023    [provider]  traMADol  (ULTRAM ) 50 MG tablet Take 1 tablet (50 mg total) by mouth every 6 (six) hours as needed for moderate pain. Patient taking differently: Take 50 mg by mouth daily. 09/06/22   [place], Camellia PARAS, DO  traMADol  (ULTRAM ) 50 MG tablet Take 50 mg by mouth every 6 (six) hours as needed for moderate pain (pain score 4-6).    [provider]  Vitamin D, Ergocalciferol, (DRISDOL) 1.25 MG (50000 UNIT) CAPS capsule Take 50,000 Units by mouth once a week.    [provider]    Allergies: Baclofen, Cefaclor, and Shellfish allergy    Review of Systems  Unable to perform ROS: Mental status change    Updated Vital Signs BP (!) 54/38 (BP Location: Left Arm)   Pulse 81   Temp 98.8 F (37.1 C)   Resp 17   SpO2 97%   Physical Exam Vitals and nursing note reviewed.  Constitutional:      Appearance: She is well-developed.     Comments: Unresponsive, chronically ill appearing  HENT:     Head: Normocephalic and atraumatic.  Eyes:     Conjunctiva/sclera: Conjunctivae normal.  Pupils: Pupils are equal, round, and reactive to light.  Cardiovascular:     Rate and Rhythm: Normal rate and regular rhythm.     Heart sounds: Normal heart sounds.  Pulmonary:     Effort: Pulmonary effort is normal.     Breath sounds: Normal breath sounds.  Abdominal:     General: Bowel sounds are normal.     Palpations: Abdomen is soft.  Musculoskeletal:        General: Normal range of motion.     Cervical back: Normal range of motion.     Comments: Severely contractured lower extremities, boots in place to both feet  Skin:    General: Skin is warm and dry.  Neurological:     Mental Status: She is oriented to person, place, and time.     (all labs ordered are listed, but only abnormal results are displayed) Labs Reviewed  CBC WITH DIFFERENTIAL/PLATELET - Abnormal; Notable for the following components:      Result Value   WBC 13.6 (*)    MCV 106.2 (*)    Neutro Abs 10.5 (*)    Abs Immature Granulocytes 0.13 (*)    All other components within normal limits  PROTIME-INR - Abnormal; Notable for the following components:   Prothrombin Time 16.5 (*)    INR 1.3 (*)    All other components within normal limits  I-STAT CG4 LACTIC ACID, ED - Abnormal; Notable for the following components:   Lactic Acid, Venous 2.3 (*)     All other components within normal limits  RESP PANEL BY RT-PCR (RSV, FLU A&B, COVID)  [id number]  CULTURE, BLOOD (ROUTINE X 2)  CULTURE, BLOOD (ROUTINE X 2)  COMPREHENSIVE METABOLIC PANEL WITH GFR  URINALYSIS, W/ REFLEX TO CULTURE (INFECTION SUSPECTED)    EKG: None  Radiology: No results found.  {Document cardiac monitor, telemetry assessment procedure when appropriate:32947} Procedures   Medications Ordered in the ED  lactated ringers  infusion (has no administration in time range)  aztreonam  (AZACTAM ) 2 g in sodium chloride  0.9 % 100 mL IVPB (has no administration in time range)  metroNIDAZOLE  (FLAGYL ) IVPB 500 mg (has no administration in time range)  vancomycin  (VANCOCIN ) IVPB 1000 mg/200 mL premix (has no administration in time range)  lactated ringers  bolus 1,000 mL (has no administration in time range)  norepinephrine  (LEVOPHED ) 4mg  in (0.016 mg/mL) premix infusion (has no administration in time range)      {Click here for ABCD2, HEART and other calculators REFRESH Note before signing:1}                              Medical Decision Making Amount and/or Complexity of Data Reviewed Labs: ordered. Radiology: ordered.  Risk Prescription drug management.   ***  {Document critical care time when appropriate  Document review of labs and clinical decision tools ie CHADS2VASC2, etc  Document your independent review of radiology images and any outside records  Document your discussion with family members, caretakers and with consultants  Document social determinants of health affecting pt's care  Document your decision making why or why not admission, treatments were needed:32947:::1}   Final diagnoses:  None    ED Discharge Orders     None        "

## 2024-07-31 ENCOUNTER — Other Ambulatory Visit: Payer: Self-pay

## 2024-07-31 DIAGNOSIS — D539 Nutritional anemia, unspecified: Secondary | ICD-10-CM | POA: Diagnosis present

## 2024-07-31 DIAGNOSIS — R627 Adult failure to thrive: Secondary | ICD-10-CM | POA: Diagnosis present

## 2024-07-31 DIAGNOSIS — Z515 Encounter for palliative care: Secondary | ICD-10-CM | POA: Diagnosis not present

## 2024-07-31 DIAGNOSIS — G20A1 Parkinson's disease without dyskinesia, without mention of fluctuations: Secondary | ICD-10-CM

## 2024-07-31 DIAGNOSIS — G9341 Metabolic encephalopathy: Secondary | ICD-10-CM

## 2024-07-31 DIAGNOSIS — E785 Hyperlipidemia, unspecified: Secondary | ICD-10-CM | POA: Diagnosis present

## 2024-07-31 DIAGNOSIS — R6521 Severe sepsis with septic shock: Secondary | ICD-10-CM | POA: Diagnosis present

## 2024-07-31 DIAGNOSIS — I10 Essential (primary) hypertension: Secondary | ICD-10-CM | POA: Diagnosis present

## 2024-07-31 DIAGNOSIS — N179 Acute kidney failure, unspecified: Secondary | ICD-10-CM | POA: Diagnosis present

## 2024-07-31 DIAGNOSIS — E119 Type 2 diabetes mellitus without complications: Secondary | ICD-10-CM | POA: Diagnosis present

## 2024-07-31 DIAGNOSIS — Z1152 Encounter for screening for COVID-19: Secondary | ICD-10-CM | POA: Diagnosis not present

## 2024-07-31 DIAGNOSIS — F0280 Dementia in other diseases classified elsewhere without behavioral disturbance: Secondary | ICD-10-CM | POA: Diagnosis present

## 2024-07-31 DIAGNOSIS — R0902 Hypoxemia: Secondary | ICD-10-CM | POA: Diagnosis present

## 2024-07-31 DIAGNOSIS — A419 Sepsis, unspecified organism: Secondary | ICD-10-CM | POA: Diagnosis present

## 2024-07-31 DIAGNOSIS — Z66 Do not resuscitate: Secondary | ICD-10-CM | POA: Diagnosis not present

## 2024-07-31 DIAGNOSIS — Z933 Colostomy status: Secondary | ICD-10-CM | POA: Diagnosis not present

## 2024-07-31 DIAGNOSIS — F419 Anxiety disorder, unspecified: Secondary | ICD-10-CM | POA: Diagnosis present

## 2024-07-31 DIAGNOSIS — E860 Dehydration: Secondary | ICD-10-CM | POA: Diagnosis present

## 2024-07-31 DIAGNOSIS — E872 Acidosis, unspecified: Secondary | ICD-10-CM | POA: Diagnosis present

## 2024-07-31 DIAGNOSIS — J189 Pneumonia, unspecified organism: Secondary | ICD-10-CM | POA: Diagnosis not present

## 2024-07-31 DIAGNOSIS — E870 Hyperosmolality and hypernatremia: Secondary | ICD-10-CM | POA: Diagnosis present

## 2024-07-31 DIAGNOSIS — K219 Gastro-esophageal reflux disease without esophagitis: Secondary | ICD-10-CM | POA: Diagnosis present

## 2024-07-31 DIAGNOSIS — R4182 Altered mental status, unspecified: Secondary | ICD-10-CM | POA: Diagnosis present

## 2024-07-31 DIAGNOSIS — E8809 Other disorders of plasma-protein metabolism, not elsewhere classified: Secondary | ICD-10-CM | POA: Diagnosis present

## 2024-07-31 DIAGNOSIS — J440 Chronic obstructive pulmonary disease with acute lower respiratory infection: Secondary | ICD-10-CM | POA: Diagnosis present

## 2024-07-31 LAB — COMPREHENSIVE METABOLIC PANEL WITH GFR
ALT: 9 U/L (ref 0–44)
ALT: 6 U/L (ref 0–44)
AST: 37 U/L (ref 15–41)
AST: 30 U/L (ref 15–41)
Albumin: 3.3 g/dL — ABNORMAL LOW (ref 3.5–5.0)
Albumin: 2.7 g/dL — ABNORMAL LOW (ref 3.5–5.0)
Alkaline Phosphatase: 79 U/L (ref 38–126)
Alkaline Phosphatase: 63 U/L (ref 38–126)
Anion gap: 19 — ABNORMAL HIGH (ref 5–15)
Anion gap: 15 (ref 5–15)
BUN: 106 mg/dL — ABNORMAL HIGH (ref 8–23)
BUN: 98 mg/dL — ABNORMAL HIGH (ref 8–23)
CO2: 23 mmol/L (ref 22–32)
CO2: 25 mmol/L (ref 22–32)
Calcium: 9.7 mg/dL (ref 8.9–10.3)
Calcium: 8.6 mg/dL — ABNORMAL LOW (ref 8.9–10.3)
Chloride: 117 mmol/L — ABNORMAL HIGH (ref 98–111)
Chloride: 120 mmol/L — ABNORMAL HIGH (ref 98–111)
Creatinine, Ser: 3.81 mg/dL — ABNORMAL HIGH (ref 0.44–1.00)
Creatinine, Ser: 3.08 mg/dL — ABNORMAL HIGH (ref 0.44–1.00)
GFR, Estimated: 11 mL/min — ABNORMAL LOW
GFR, Estimated: 15 mL/min — ABNORMAL LOW
Glucose, Bld: 163 mg/dL — ABNORMAL HIGH (ref 70–99)
Glucose, Bld: 153 mg/dL — ABNORMAL HIGH (ref 70–99)
Potassium: 4.1 mmol/L (ref 3.5–5.1)
Potassium: 3.7 mmol/L (ref 3.5–5.1)
Sodium: 159 mmol/L — ABNORMAL HIGH (ref 135–145)
Sodium: 160 mmol/L — ABNORMAL HIGH (ref 135–145)
Total Bilirubin: 0.4 mg/dL (ref 0.0–1.2)
Total Bilirubin: 0.3 mg/dL (ref 0.0–1.2)
Total Protein: 6.9 g/dL (ref 6.5–8.1)
Total Protein: 5.4 g/dL — ABNORMAL LOW (ref 6.5–8.1)

## 2024-07-31 LAB — BLOOD CULTURE ID PANEL (REFLEXED) - BCID2

## 2024-07-31 LAB — CBC WITH DIFFERENTIAL/PLATELET
Abs Immature Granulocytes: 0.11 K/uL — ABNORMAL HIGH (ref 0.00–0.07)
Basophils Absolute: 0 K/uL (ref 0.0–0.1)
Basophils Relative: 0 %
Eosinophils Absolute: 0 K/uL (ref 0.0–0.5)
Eosinophils Relative: 0 %
HCT: 35 % — ABNORMAL LOW (ref 36.0–46.0)
Hemoglobin: 10.9 g/dL — ABNORMAL LOW (ref 12.0–15.0)
Immature Granulocytes: 1 %
Lymphocytes Relative: 13 %
Lymphs Abs: 1.7 K/uL (ref 0.7–4.0)
MCH: 32 pg (ref 26.0–34.0)
MCHC: 31.1 g/dL (ref 30.0–36.0)
MCV: 102.6 fL — ABNORMAL HIGH (ref 80.0–100.0)
Monocytes Absolute: 1.1 K/uL — ABNORMAL HIGH (ref 0.1–1.0)
Monocytes Relative: 8 %
Neutro Abs: 10.6 K/uL — ABNORMAL HIGH (ref 1.7–7.7)
Neutrophils Relative %: 78 %
Platelets: 197 K/uL (ref 150–400)
RBC: 3.41 MIL/uL — ABNORMAL LOW (ref 3.87–5.11)
RDW: 13.5 % (ref 11.5–15.5)
WBC: 13.5 K/uL — ABNORMAL HIGH (ref 4.0–10.5)
nRBC: 0 % (ref 0.0–0.2)

## 2024-07-31 LAB — PROCALCITONIN: Procalcitonin: 0.22 ng/mL

## 2024-07-31 LAB — RESP PANEL BY RT-PCR (RSV, FLU A&B, COVID)  RVPGX2
Influenza A by PCR: NEGATIVE
Influenza B by PCR: NEGATIVE
Resp Syncytial Virus by PCR: NEGATIVE
SARS Coronavirus 2 by RT PCR: NEGATIVE

## 2024-07-31 LAB — MAGNESIUM: Magnesium: 2.1 mg/dL (ref 1.7–2.4)

## 2024-07-31 LAB — I-STAT CG4 LACTIC ACID, ED: Lactic Acid, Venous: 1.3 mmol/L (ref 0.5–1.9)

## 2024-07-31 MED ORDER — GLYCOPYRROLATE 0.2 MG/ML IJ SOLN: 0.2000 mg | INTRAMUSCULAR | Status: DC | PRN

## 2024-07-31 MED ORDER — ONDANSETRON HCL 4 MG/2ML IJ SOLN: 4.0000 mg | Freq: Four times a day (QID) | INTRAMUSCULAR | Status: DC | PRN

## 2024-07-31 MED ORDER — SODIUM CHLORIDE 0.9 % IV SOLN: 2.0000 g | INTRAVENOUS | Status: DC

## 2024-07-31 MED ORDER — LORAZEPAM 2 MG/ML IJ SOLN: 0.5000 mg | Freq: Four times a day (QID) | INTRAMUSCULAR | Status: DC | PRN

## 2024-07-31 MED ORDER — ACETAMINOPHEN 325 MG PO TABS: 650.0000 mg | ORAL_TABLET | Freq: Four times a day (QID) | ORAL | Status: DC | PRN

## 2024-07-31 MED ORDER — POLYVINYL ALCOHOL 1.4 % OP SOLN: 1.0000 [drp] | Freq: Four times a day (QID) | OPHTHALMIC | Status: DC | PRN

## 2024-07-31 MED ORDER — ACETAMINOPHEN 650 MG RE SUPP: 650.0000 mg | Freq: Four times a day (QID) | RECTAL | Status: DC | PRN

## 2024-07-31 MED ORDER — GLYCOPYRROLATE 1 MG PO TABS: 1.0000 mg | ORAL_TABLET | ORAL | Status: DC | PRN

## 2024-07-31 MED ORDER — ALBUTEROL SULFATE (2.5 MG/3ML) 0.083% IN NEBU: 2.5000 mg | INHALATION_SOLUTION | RESPIRATORY_TRACT | Status: DC | PRN

## 2024-07-31 MED ORDER — DEXTROSE 5 % IV SOLN
500.0000 mg | Freq: Three times a day (TID) | INTRAVENOUS | Status: DC
  Administered 2024-07-31: 500 mg via INTRAVENOUS
  Filled 2024-07-31 (×2): qty 2.5

## 2024-07-31 MED ORDER — METRONIDAZOLE 500 MG/100ML IV SOLN: 500.0000 mg | Freq: Two times a day (BID) | INTRAVENOUS | Status: DC

## 2024-07-31 MED ORDER — VANCOMYCIN VARIABLE DOSE PER UNSTABLE RENAL FUNCTION (PHARMACIST DOSING): Status: DC

## 2024-07-31 MED ORDER — LACTATED RINGERS IV BOLUS (SEPSIS)
1000.0000 mL | Freq: Once | INTRAVENOUS | Status: AC
  Administered 2024-07-31: 1000 mL via INTRAVENOUS

## 2024-07-31 MED ORDER — MORPHINE SULFATE (PF) 2 MG/ML IV SOLN: 2.0000 mg | INTRAVENOUS | Status: DC | PRN

## 2024-07-31 MED ORDER — BIOTENE DRY MOUTH MT LIQD
15.0000 mL | Freq: Three times a day (TID) | OROMUCOSAL | Status: DC
  Administered 2024-08-01: 15 mL via TOPICAL

## 2024-07-31 NOTE — H&P (Addendum)
 " History and Physical    Patient: Alyssa Boyd FMW:[id number] DOB: [birth year] DOA: 07/30/2024 DOS: the patient was seen and examined on 07/31/2024 PCP: [facility] [place]  Patient coming from: [facility] EMS  Chief Complaint:  Chief Complaint  Patient presents with   Altered Mental Status   HPI: Alyssa Boyd is a 84 y.o. 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  Patient was unable to provide any history.    Nursing facility staff noted patient had declining cognition and physical activity over the last 2 weeks and yesterday evening staff had been unable to wake the patient patient is normally alert but confused due to the history of Parkinson's and route with EMS patient was noted to be unresponsive to stimuli but maintaining her airway.  Family did make note that after suffering the stercoral colitis with perforation wiring colectomy back in 2024 patient had been in a skilled nursing facility and had become bedbound.  Over the last couple weeks they noted that she was less interactive with poor p.o. intake, was not taking medications, and sleeping most of the time.    Upon EMS arrival blood pressure was noted to be as low as 48/25.  Patient was bolused 500 mL of IV fluids as well as given a 20 mcg push of epinephrine the patient was placed on 4 L nasal cannula oxygen.  In the emergency department patient was noted to be afebrile with respirations 13-50, initial blood pressure as low as 50/25 with improvement after IV fluid bolus and O2 saturations currently maintained on 5 L nasal cannula oxygen..  Labs from 1/21 significant for WBC 13.6, sodium 159, chloride 117, BUN 106, creatinine 3.81,  anion gap 19, lactic acid 2.3, albumin  3.3, and INR 1.3.  Influenza, COVID-19, and RSV screening were negative.  Blood cultures were obtained.  Chest x-ray noted no acute abnormality with mild 6 mm diam nodule in sleep.  Suspecting over the posterior fifth rib.  CT scan of the head did not note any acute abnormality.  Patient was bolused 1 L of lactated Ringer 's, vancomycin , metronidazole , aztreonam , and had temporarily been placed on Levophed .  Emergency room provider had long discussion with family which made note of more significant decline in the last 2 weeks.  Agreed the patient should have a DO NOT RESUSCITATE and DO NOT INTUBATE order in place.  At this time they wanted to continue with supportive care.  Pressors were able to be discontinued.  Palliative care has been consulted to help establish goals of care.    Review of Systems: unable to review all systems due to the inability of the patient to answer questions. Past Medical History:  Diagnosis Date   COPD (chronic obstructive pulmonary disease) (HCC)    Diabetes mellitus without complication (HCC)    Hyperlipidemia    Hypertension    Parkinson disease (HCC)    Past Surgical History:  Procedure Laterality Date   BLADDER REPAIR N/A 08/30/2022   Procedure: BLADDER REPAIR;  Surgeon: Lyndel Deward PARAS, MD;  Location: [facility] OR;  Service: General;  Laterality: N/A;   COLOSTOMY N/A 08/30/2022   Procedure: COLOSTOMY;  Surgeon: Lyndel Deward PARAS, MD;  Location: [facility] OR;  Service: General;  Laterality: N/A;   COLOSTOMY REVISION N/A 08/30/2022  Procedure: COLON RESECTION;  Surgeon: Lyndel Deward PARAS, MD;  Location: [facility] OR;  Service: General;  Laterality: N/A;   LAPAROTOMY N/A 08/30/2022   Procedure: EXPLORATORY LAPAROTOMY;  Surgeon: Lyndel Deward PARAS, MD;  Location: [facility] OR;  Service: General;  Laterality: N/A;   REPAIR VAGINAL CUFF  08/30/2022   Procedure: VAGINAL REPAIR;  Surgeon: Lyndel Deward PARAS, MD;  Location: [facility] OR;  Service:  General;;   Social History:  has no history on file for tobacco use, alcohol  use, and drug use.  Allergies[1]  No family history on file.  Prior to Admission medications  Medication Sig Start Date End Date Taking? Authorizing Provider  albuterol  (VENTOLIN  HFA) 108 (90 Base) MCG/ACT inhaler Inhale 2 puffs into the lungs in the morning and at bedtime.   Yes [provider]  aspirin  81 MG chewable tablet Chew 81 mg by mouth daily.   Yes [provider]  atorvastatin  (LIPITOR) 20 MG tablet Take 20 mg by mouth at bedtime.   Yes [provider]  bisacodyl (DULCOLAX) 5 MG EC tablet Take 5 mg by mouth in the morning.   Yes [provider]  calcium  carbonate (OS-CAL - DOSED IN MG OF ELEMENTAL CALCIUM ) 1250 (500 Ca) MG tablet Take 1 tablet by mouth daily with breakfast.   Yes [provider]  carbidopa -levodopa  (SINEMET  IR) 25-100 MG tablet Take 1 tablet by mouth 4 (four) times daily.   Yes [provider]  dextrose  5 %, Impella PURGE, solution 1000 mL bag 5 % 1,000 mLs by Intracatheter route 2 (two) times daily. Infuse 50cc/hr to complete 1L of fluid hydration for hypernatremia. D/C order once complete.   Yes [provider]  escitalopram (LEXAPRO) 10 MG tablet Take 10 mg by mouth daily.   Yes [provider]  famotidine (PEPCID) 20 MG tablet Take 20 mg by mouth 2 (two) times daily.   Yes [provider]  fluticasone  (FLONASE ) 50 MCG/ACT nasal spray Place 1 spray into both nostrils at bedtime.   Yes [provider]  fluticasone -salmeterol (ADVAIR) 250-50 MCG/ACT AEPB Inhale 1 puff into the lungs in the morning and at bedtime.   Yes [provider]  gabapentin (NEURONTIN) 100 MG capsule Take 200 mg by mouth in the morning and at bedtime.   Yes [provider]  lidocaine  4 % Place 2 patches onto the skin daily. For bilateral quadriceps   Yes [provider]  loratadine (CLARITIN) 10 MG  tablet Take 10 mg by mouth at bedtime.   Yes [provider]  LORazepam  (ATIVAN ) 0.5 MG tablet Take 0.5 mg by mouth in the morning, at noon, and at bedtime. Take one tablet (0.5mg ) three times a day for anxiety. Hold if sedated.   Yes [provider]  memantine (NAMENDA) 5 MG tablet Take 5 mg by mouth 2 (two) times daily. 07/09/23  Yes [provider]  Menthol, Topical Analgesic, (BIOFREEZE COOL THE PAIN) 4 % GEL Apply 1 Application topically in the morning, at noon, and at bedtime.   Yes [provider]  methocarbamol  (ROBAXIN ) 500 MG tablet Take 500 mg by mouth 2 (two) times daily.   Yes [provider]  metoprolol  tartrate (LOPRESSOR ) 50 MG tablet Take 50 mg by mouth 2 (two) times daily.   Yes [provider]  mirtazapine (REMERON) 7.5 MG tablet Take 7.5 mg by mouth at bedtime. 06/30/24  Yes [provider]  montelukast  (SINGULAIR ) 10 MG tablet Take 10 mg by mouth at bedtime.  Yes [provider]  Multiple Vitamins-Minerals (MULTI COMPLETE PO) Take 1 tablet by mouth at bedtime.   Yes [provider]  nutrition supplement, JUVEN, (JUVEN) PACK Take 1 packet by mouth 2 (two) times daily with a meal.  08/27/24 Yes [provider]  polyethylene glycol (MIRALAX  / GLYCOLAX ) 17 g packet Take 17 g by mouth 2 (two) times daily. Patient taking differently: Take 17 g by mouth at bedtime. 09/06/22  Yes [place], Eric J, DO  sennosides-docusate sodium  (SENOKOT-S) 8.6-50 MG tablet Take 2 tablets by mouth daily.   Yes [provider]  simethicone (MYLICON) 80 MG chewable tablet Chew 80 mg by mouth in the morning.   Yes [provider]  telmisartan (MICARDIS) 40 MG tablet Take 40 mg by mouth daily.   Yes [provider]  traMADol  (ULTRAM ) 50 MG tablet Take 25 mg by mouth every 6 (six) hours as needed for moderate pain (pain score 4-6) (breakthrough pain).   Yes [provider]  traMADol   (ULTRAM ) 50 MG tablet Take 50 mg by mouth at bedtime.   Yes [provider]  Vitamin D, Ergocalciferol, (DRISDOL) 1.25 MG (50000 UNIT) CAPS capsule Take 50,000 Units by mouth every Wednesday.   Yes [provider]    Physical Exam: Vitals:   07/31/24 0430 07/31/24 0530 07/31/24 0545 07/31/24 0645  BP: (!) 107/46 (!) 96/46 (!) 80/39 (!) 100/43  Pulse: 81 85 75 90  Resp: 17 18 (!) 23 (!) 22  Temp:   97.8 F (36.6 C)   TempSrc:   Axillary   SpO2: 99% 98% 100% 100%    Constitutional: Ill-appearing thin elderly female Eyes: PERRL, lids and conjunctivae normal ENMT: Mucous membranes are dry.  Tongue protruded from mouth. Neck: Neck flexed forward. Respiratory: clear to auscultation bilaterally, no wheezing, no crackles. Normal respiratory effort. No accessory muscle use.  Cardiovascular: Regular rate and rhythm, no murmurs / rubs / gallops. No extremity edema. 2+ pedal pulses. No carotid bruits.  Abdomen: no tenderness, no masses palpated. No hepatosplenomegaly. Bowel sounds positive.  Musculoskeletal: Contractures noted of the bilateral feet with thoracic kyphosis Neurologic: Unable to assess Psychiatric: Patient lethargic and not responding to commands to assess  Data Reviewed:  EKG revealed normal sinus rhythm at 84 bpm with ST depressions in the anterior lateral leads.  Reviewed labs, imaging, and pertinent records as documented  Assessment and Plan:   Septic shock  Patient presented to be significantly altered with decreased p.o. intake in the last 2 weeks.  With EMS blood pressures noted to be as low as 40s / 20s.  Patient was bolused IV fluids, received epi and route with EMS and subsequently was placed on Levophed  while in the ED until family arrived.  Noted to be afebrile with tachypnea and leukocytosis of 13.5 meeting SIRS criteria with initial lactic acid of 2.3.  Procalcitonin noted to be 0.22.  Blood cultures were obtained.  Patient had initially been  initially started on empiric antibiotics of vancomycin , aztreonam , and metronidazole .1/3  blood culture was noted to be positive for gram-positive cocci reported to be Staph epidermidis with methicillin resistance thought to likely be contaminant.    Acute renal failure Dehydration Hypernatremia Metabolic acidosis with elevated anion gap Acute.  Initial labs sodium 159->160, creatinine elevated up to 3.81 with BUN 106, and anion gap of 19.  Creatinine had been within normal limits on 1/8 at 0.68.  Patient noted to have skin tenting on physical exam.  Reported to have significantly  decreased p.o. intake in the last 2 weeks.  She had been bolused a liter of lactated ringer  IV fluids and placed a heart rate in 150 mL/h.  Repeat creatinine 3.08.  Acute metabolic encephalopathy Patient noted to be minimally responsive.  CT scan of the head was limited without any acute abnormality noted.  Macrocytic anemia Hemoglobin noted to be 10.9 on recheck today, but previously had 12-13 on recent checks.  Possibly secondary to  Failure to thrive Parkinson's disease Patient's family notes that she had been declining over the last month or so and in the last 2 weeks had stopped eating and drinking.  Addendum: After palliative care had further discussions with the patient's daughter and son noting patient's progressive decline over the last couple of weeks they were in agreements to transition to comfort measures only.  Family requested cardiac monitoring to be continued. - Admit to a telemetry bed - Palliative care order set initiated - Routine vital sign checks - Discontinued home medications - N.p.o. - Nasal cannula oxygen as needed for comfort - Okay for RN to pronounce death - Aspiration precautions - Maintain IV access -- External catheter to be placed  - morphine  prn pain - Zofran  IV prn nausea/vomiting - Ativan  IV prn anxiety - Glycopyrrolate  prn excessive secretions   DVT prophylaxis:  None  Advance Care Planning:   Code Status: Do not attempt resuscitation (DNR) - Comfort care    Consults: Palliative care  Family Communication: Son updated over  Severity of Illness: The appropriate patient status for this patient is INPATIENT. Inpatient status is judged to be reasonable and necessary in order to provide the required intensity of service to ensure the patient's safety. The patient's presenting symptoms, physical exam findings, and initial radiographic and laboratory data in the context of their chronic comorbidities is felt to place them at high risk for further clinical deterioration. Furthermore, it is not anticipated that the patient will be medically stable for discharge from the hospital within 2 midnights of admission.   * I certify that at the point of admission it is my clinical judgment that the patient will require inpatient hospital care spanning beyond 2 midnights from the point of admission due to high intensity of service, high risk for further deterioration and high frequency of surveillance required.*  Author: Maximino DELENA Sharps, MD 07/31/2024 7:21 AM  For on call review [email or web address].      [1]  Allergies Allergen Reactions   Baclofen Anaphylaxis   Cefaclor Anaphylaxis, Hives and Itching   Shellfish Allergy Hives, Itching and Nausea Only   "

## 2024-07-31 NOTE — Progress Notes (Signed)
 PHARMACY - PHYSICIAN COMMUNICATION CRITICAL VALUE ALERT - BLOOD CULTURE IDENTIFICATION (BCID)  Alyssa Boyd is an 84 y.o. female who presented to [facility] on 07/30/2024 with a chief complaint of ARF. She has since been transitioned to comfort measures.   Assessment:    1/21: 1/3 bottles (aerobic) GPCs- staph epi with MecA detected, possible contaminant   Name of physician (or Provider) Contacted: Dr. Claudene   Current antibiotics: N/A- on comfort   Changes to prescribed antibiotics recommended:  Patient is on recommended antibiotics - No changes needed- Patient is on comfort measures   No results found for this or any previous visit.  Massie Fila, PharmD Clinical Pharmacist  07/31/2024 7:43 PM

## 2024-07-31 NOTE — Consult Note (Signed)
 "                                               Consultation Note Date: 07/31/2024   Patient Name: Alyssa Boyd  DOB: [birth year]  MRN: [id number]  Age / Sex: 84 y.o., female  [facility]P: [facility] [place] Referring Physician: Claudene Maximino LABOR, MD  Reason for Consultation: Establishing goals of care  HPI/Patient Profile: 84 y.o. female  with past medical history of Parkinson's disease admitted on 07/30/2024 with AMS in the setting of progressive diminished responsiveness over the last week. Initial systolic BPs 50s-60s, improved with low dose levophed  and fluids. Conversations were had overnight with family and decisions were made for DNR/DNI and to discontinue pressors. Sodium and creatinine elevated. COVID, influenza, RSV PCR were all negative. Chest x-ray showed no evidence of acute cardiopulmonary process. Noncontrast CT head showed no evidence of acute intracranial process. PMT consulted to help further discuss GOC.   Clinical Assessment and Goals of Care: I have reviewed medical records including EPIC notes, labs and imaging, received report from RN, assessed the patient and then met with patient's daughter Alyssa Boyd  to discuss diagnosis prognosis, GOC, EOL wishes, disposition and options.  Alyssa Boyd shares that she and her brother share HCPOA; however her brother does not live locally and defers decision making to her - they are on the same page regarding medical decisions. She shares her father/patient's spouse is also supportive of the decisions she is making; he is just having emotional difficulty participating in decision making.   I introduced Palliative Medicine as specialized medical care for people living with serious illness. It focuses on providing relief from the symptoms and stress of a serious illness. The goal is to improve quality of life for both the patient and the family.  We discussed a brief life review of the patient. Patient and her spouse have been married at least 60 years. 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 She has 2 children - her daughter Alyssa Boyd and a son that lives out of state.   Alyssa Boyd shares of patient's diagnosis of Parkinsons - initially did very well. During 2020 she became less active and has declined since then. She initially went to ALF with her spouse to live but had a major medical event leading to surgery resulting in colostomy and since that time has been at [facility] for several years. Alyssa Boyd tells me at [facility] she has essentially become bedbound. She tells me of more rapid decline over the past few weeks - becoming less interactive with poor PO intake. Sleeps most of the time, doesn't take meds. Alyssa Boyd also tells me about wounds on patient's back that have only gotten worse.    We discussed patient's current illness and what it means in the larger context of patient's on-going co-morbidities. Alyssa Boyd reviews her conversations with physicians overnight. She understands patient is at end of life.   I attempted to elicit values and goals of care important to the patient. She tells me of family's desire to focus on comfort and not prolong the current situation.   We discuss a shift to comfort measures. I explained comfort care as care where the patient would no longer receive aggressive medical interventions such as continuous vital signs, lab work, radiology testing, or medications not focused on comfort, peace, and dignity. This includes stopping antibiotics  and weaning oxygen to room air, as these are generally not accepted as providing comfort but only prolonging the dying process artificially. All care would focus on how the patient is looking and feeling. This would include management of any symptoms that may cause discomfort, pain, shortness of breath/air hunger, increased work of breathing, cough, nausea, agitation/restlessness, anxiety, and/or secretions etc. Symptoms would be managed with medications and other non-pharmacological interventions such as spiritual  support if requested, repositioning, music therapy, or therapeutic listening. Alyssa Boyd does request that the monitor be left on patient as she finds comfort in this.   We discuss expectations moving forward - likely continuing of current presentation - not interacting and sleeping until she passes.   Hospice services outpatient were explained and offered. Specifically discussed option of hospice facility. Alyssa Boyd shares [facility] would be best location for her family. She is [place] in [place] [facility].   Questions and concerns were addressed. The family was encouraged to call with questions or concerns.   Discussed the above the hospice liaison, Dr. Claudene, and RN.    Primary Decision Maker NEXT OF KIN - daughter Alyssa Boyd is HCPOA    SUMMARY OF RECOMMENDATIONS   - transition to full comfort measures - dc fluids and antibiotics, daughter requests we leave heart monitor - refer to hospice facility in [facility]  Code Status/Advance Care Planning: DNR     Primary Diagnoses: Present on Admission:  Acute renal failure (ARF)   I have reviewed the medical record, interviewed the patient and family, and examined the patient. The following aspects are pertinent.  Past Medical History:  Diagnosis Date   COPD (chronic obstructive pulmonary disease) (HCC)    Diabetes mellitus without complication (HCC)    Hyperlipidemia    Hypertension    Parkinson disease (HCC)    Social History   Socioeconomic History   Marital status: Married    Spouse name: Not on file   Number of children: Not on file   Years of education: Not on file   Highest education level: Not on file  Occupational History   Not on file  Tobacco Use   Smoking status: Not on file   Smokeless tobacco: Not on file  Substance and Sexual Activity   Alcohol  use: Not on file   Drug use: Not on file   Sexual activity: Not on file  Other Topics Concern   Not on file  Social History Narrative    Not on file   Social Drivers of Health   Tobacco Use: Low Risk (10/29/2023)   Received from [facility]   Patient History    Smoking Tobacco Use: Never    Smokeless Tobacco Use: Never    Passive Exposure: Not on file  Financial Resource Strain: Not on file  Food Insecurity: Not on file  Transportation Needs: Not on file  Physical Activity: Not on file  Stress: Not on file  Social Connections: Not on file  Depression (EYV7-0): Not on file  Alcohol  Screen: Not on file  Housing: Not on file  Utilities: Not on file  Health Literacy: Not on file   No family history on file. Scheduled Meds:  antiseptic oral rinse  15 mL Topical TID   Continuous Infusions: PRN Meds:.acetaminophen  **OR** acetaminophen , albuterol , artificial tears, glycopyrrolate  **OR** glycopyrrolate  **OR** glycopyrrolate , ondansetron  (ZOFRAN ) IV Allergies[1] Review of Systems  Unable to perform ROS: Patient unresponsive    Physical Exam Constitutional:      General: She is  not in acute distress.    Appearance: She is ill-appearing.     Comments: Does not respond to voice or touch  Pulmonary:     Effort: Pulmonary effort is normal.  Skin:    General: Skin is warm and dry.     Vital Signs: BP (!) 130/52 (BP Location: Left Arm)   Pulse 85   Temp 97.9 F (36.6 C) (Axillary)   Resp 16   SpO2 98%          SpO2: SpO2: 98 % O2 Device:SpO2: 98 % O2 Flow Rate: .O2 Flow Rate (L/min): 5 L/min  IO: Intake/output summary:  Intake/Output Summary (Last 24 hours) at 07/31/2024 1343 Last data filed at 07/31/2024 0444 Gross per 24 hour  Intake 1400 ml  Output --  Net 1400 ml    LBM:   Baseline Weight:   Most recent weight:       Palliative Assessment/Data: PPS 10%     *Please note that this is a verbal dictation therefore any spelling or grammatical errors are due to the Dragon Medical One system interpretation.   I personally spent a total of 80 minutes in the care of the patient today  including preparing to see the patient, performing a medically appropriate exam/evaluation, counseling and educating, placing orders, referring and communicating with other health care professionals, documenting clinical information in the EHR, independently interpreting results, and coordinating care.     Tobey Jama Barnacle, DNP, AGNP-C Palliative Medicine Team [phone] Pager: [phone]     [1]  Allergies Allergen Reactions   Baclofen Anaphylaxis   Cefaclor Anaphylaxis, Hives and Itching   Shellfish Allergy Hives, Itching and Nausea Only   "

## 2024-07-31 NOTE — Sepsis Progress Note (Signed)
 Elink monitoring for the code sepsis protocol.

## 2024-07-31 NOTE — Progress Notes (Signed)
" °  Carryover admission to the Day Admitter.  I discussed this case with the EDP, Olam Slocumb, PA.  Per these discussions:   This is a 84 year old female with Parkinson's disease, bedbound, who presents to the ED for evaluation of altered mental status, in the setting of progressive diminished responsiveness over the last week.  She has progressive Parkinson's disease initially diagnosed 12 years ago, bedbound over that timeframe.  Family conveys that the patient has had significant decline in oral intake over the last 2 weeks, and has had no significant oral intake over the course the last 1 week.   Pt's husband and daughter at bedside conveyed to EDP that the patient is DNR/DNI; husband and daughter specifically conveyed to EDP that they do not want to continue with any pressors, even if pressors are needed to sustain life; husband/daughter are considering transitioning pt to comfort care measures only, but are not quite ready for this transition at the moment. In the meantime, they are amenable to ivf's, iv abx, but do not want to escalate care beyond these measures.   In the ED this evening, vital signs notable for the following: Afebrile; heart rates in the 70s to 80s; initial systolic blood pressures in the 50s to 60s, improving into the low 100s to 130s following initiation of IV fluids as well as Levophed .  Per patient's husband/daughter's request, Levophed  has been stopped, and blood pressure is starting to trend down.  Family conveys the patient's baseline systolic blood pressures run in the low 100's mmHg. respiratory rate 14-22.   Labs notable for the following: Sodium 159, potassium 4.1, chloride 117, bicarbonate 23, anion gap 19, creatinine 3.81 compared to most recent prior creatinine data point of 0.68 in January 2025, BUN / Cr ratio 27.8, glucose 163, calcium  level adjusted for hypoalbuminemia 10.3.  Urinalysis is pending.  CBC notable for white blood cell count 13,600, hemoglobin 13.  Blood  cultures x 2 collected.  COVID, influenza, RSV PCR were all negative.  Initial lactic acid 2.3, with repeat lactate currently pending.  Chest x-ray showed no evidence of acute cardiopulmonary process, including no evidence of infiltrate to suggest pneumonia.  Noncontrast CT head showed no evidence of acute intracranial process.  In the ED the patient has received 1 L lactated Ringer 's followed by initiation continuous LR running at 150 cc/h.  She is also received broad-spectrum undifferentiated IV antibiotics in the form of IV vancomycin , aztreonam , and Flagyl .  I have placed an order for  inpatient admission for further evaluation management of the above.   I have placed some additional preliminary admit orders via the adult multi-morbid admission order set. I have also ordered continuation of lactated Ringer 's at 150 cc/h.  I have added on procalcitonin level and continued existing orders for IV vancomycin  and aztreonam  for broad-spectrum undifferentiated IV antibiotics in the setting of SIRS criteria without source of infection.  In the absence of overt of underlying infection, criteria for sepsis are not currently met. AM labs ordered in the form of cmp, cbc, mag level.  I've placed order for palliative care consult to help further assess goals for care, including family consider transition to comfort care measures only. Per family's wishes, I have entered pt's code status as DNR/DNI.     Eva Pore, DO Hospitalist  "

## 2024-07-31 NOTE — ED Notes (Signed)
 Family notified this RN that they wished to discontinue the Norepinephrine . EDP made aware and at bedside.

## 2024-07-31 NOTE — Progress Notes (Signed)
 Pharmacy Antibiotic Note  Alyssa Boyd is a 84 y.o. female admitted on 07/30/2024 with sepsis.  Pharmacy has been consulted for Vancomycin  dosing. WBC is elevated. Acute renal failure present.   Plan: Vancomycin  1000 mg IV x 1, further dosing per Scr trend Aztreonam  per MD Trend WBC, temp, renal function  F/U infectious work-up Drug levels as indicated   Temp (24hrs), Avg:98.8 F (37.1 C), Min:98.8 F (37.1 C), Max:98.8 F (37.1 C)  Recent Labs  Lab 07/30/24 2322 07/30/24 2324 07/31/24 0300  WBC 13.6*  --   --   CREATININE 3.81*  --  3.08*  LATICACIDVEN  --  2.3*  --     CrCl cannot be calculated (Unknown ideal weight.).    Allergies[1]  Lynwood Mckusick, PharmD, BCPS Clinical Pharmacist Phone: [phone]      [1]  Allergies Allergen Reactions   Baclofen Anaphylaxis   Cefaclor Anaphylaxis, Hives and Itching   Shellfish Allergy Hives, Itching and Nausea Only

## 2024-08-01 MED ORDER — HYDROMORPHONE HCL 1 MG/ML IJ SOLN: 0.5000 mg | INTRAMUSCULAR | Status: DC | PRN

## 2024-08-01 NOTE — Progress Notes (Signed)
 " PROGRESS NOTE    Alyssa Boyd  FMW:[id number] DOB: [birth year] DOA: 07/30/2024 PCP: [name]    Chief Complaint  Patient presents with   Altered Mental Status    Brief Narrative:    Alyssa Boyd is a 84 y.o. 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  Patient was unable to provide any history.   - Her workup significant for severe hyponatremia, hypotension, hypoxia, patient confirmed DNR/DNI with no escalation of care, palliative medicine consulted with decision made to proceed with full comfort measures  Assessment & Plan:   Principal Problem:   Acute renal failure (ARF) Active Problems:   Septic shock (HCC)   Hypernatremia   Metabolic acidosis with increased anion gap and accumulation of organic acids   Acute metabolic encephalopathy   Macrocytic anemia   Parkinson disease (HCC)   Failure to thrive in adult    Septic shock  Acute renal failure Dehydration Hypernatremia Metabolic acidosis with elevated anion gap Acute metabolic encephalopathy Parkinson dementia Macrocytic anemia Failure to thrive Parkinson's disease End-of-life care    Palliative medicine input greatly appreciated, currently patient with full comfort measures, she appears comfortable on current regimen, continue with current care, awaiting hospice facility bed morphine  changed to Dilaudid  given AKI.     DVT prophylaxis: (comfort care) Code Status: (Comfort) Family Communication: (none at bedside, palliative medicine discussed with daughter Disposition:   Status is: Inpatient    Consultants:  Palliative medicine   Subjective:  Patient herself nonverbal, unable to provide any  complaints  Objective: Vitals:   07/31/24 1741 07/31/24 2036 08/01/24 0758 08/01/24 1207  BP: (!) 119/55  (!) 102/54 (!) 109/54  Pulse: 95   79  Resp:   16 18  Temp: 98.8 F (37.1 C) 97.6 F (36.4 C) 99.7 F (37.6 C) (!) 100.4 F (38 C)  TempSrc: Oral Axillary Axillary Axillary  SpO2: 96%      No intake or output data in the 24 hours ending 08/01/24 1418 There were no vitals filed for this visit.  Examination:  Chronically ill-appearing, unresponsive does not follow any commands or answer any questions, chronically ill-appearing but she appears comfortable No tachypnea, no use of accessory respiratory muscles Skin is warm and dry.  Data Reviewed: I have personally reviewed following labs and imaging studies  CBC: Recent Labs  Lab 07/30/24 2322 07/31/24 0521  WBC 13.6* 13.5*  NEUTROABS 10.5* 10.6*  HGB 13.0 10.9*  HCT 42.7 35.0*  MCV 106.2* 102.6*  PLT 268 197    Basic Metabolic Panel: Recent Labs  Lab 07/30/24 2322 07/31/24 0300  NA 159* 160*  K 4.1 3.7  CL 117* 120*  CO2 23 25  GLUCOSE 163* 153*  BUN 106* 98*  CREATININE 3.81* 3.08*  CALCIUM  9.7 8.6*  MG  --  2.1    GFR: CrCl cannot be calculated (Unknown ideal weight.).  Liver Function Tests: Recent Labs  Lab 07/30/24 2322 07/31/24 0300  AST 37 30  ALT 9 6  ALKPHOS 79 63  BILITOT 0.4 0.3  PROT 6.9 5.4*  ALBUMIN  3.3* 2.7*    CBG: No results for input(s): GLUCAP in the last 168 hours.   Recent Results (from the past 240 hours)  Blood Culture (routine x 2)     Status: None (Preliminary  result)   Collection Time: 07/30/24 11:31 PM   Specimen: BLOOD RIGHT ARM  Result Value Ref Range Status   Specimen Description BLOOD RIGHT ARM  Final   Special Requests   Final    BOTTLES DRAWN AEROBIC AND ANAEROBIC Blood Culture adequate volume   Culture  Setup Time   Final    GRAM POSITIVE COCCI IN BOTH AEROBIC AND ANAEROBIC BOTTLES CRITICAL RESULT CALLED TO, READ BACK BY AND VERIFIED WITH:  PHARMD A. PAYTES [id number] @ 1941 FH    Culture   Final    GRAM POSITIVE COCCI IDENTIFICATION AND SUSCEPTIBILITIES TO FOLLOW Performed at [facility], [address], [place], [place] [address]    Report Status PENDING  Incomplete  Blood Culture ID Panel (Reflexed)     Status: Abnormal   Collection Time: 07/30/24 11:31 PM  Result Value Ref Range Status   Enterococcus faecalis NOT DETECTED NOT DETECTED Final   Enterococcus Faecium NOT DETECTED NOT DETECTED Final   Listeria monocytogenes NOT DETECTED NOT DETECTED Final   Staphylococcus species DETECTED (A) NOT DETECTED Final    Comment: CRITICAL RESULT CALLED TO, READ BACK BY AND VERIFIED WITH: PHARMD A. PAYTES [id number] @ 1941 FH    Staphylococcus aureus (BCID) NOT DETECTED NOT DETECTED Final   Staphylococcus epidermidis DETECTED (A) NOT DETECTED Final    Comment: Methicillin (oxacillin) resistant coagulase negative staphylococcus. Possible blood culture contaminant (unless isolated from more than one blood culture draw or clinical case suggests pathogenicity). No antibiotic treatment is indicated for blood  culture contaminants. CRITICAL RESULT CALLED TO, READ BACK BY AND VERIFIED WITH: PHARMD A. PAYTES [id number] @ 1941 FH    Staphylococcus lugdunensis NOT DETECTED NOT DETECTED Final   Streptococcus species NOT DETECTED NOT DETECTED Final   Streptococcus agalactiae NOT DETECTED NOT DETECTED Final   Streptococcus pneumoniae NOT DETECTED NOT DETECTED Final   Streptococcus pyogenes NOT DETECTED NOT DETECTED Final   A.calcoaceticus-baumannii NOT DETECTED NOT DETECTED Final   Bacteroides fragilis NOT DETECTED NOT DETECTED Final   Enterobacterales NOT DETECTED NOT DETECTED Final   Enterobacter cloacae complex NOT DETECTED NOT DETECTED Final   Escherichia coli NOT DETECTED NOT DETECTED Final   Klebsiella aerogenes NOT DETECTED NOT DETECTED Final   Klebsiella oxytoca NOT DETECTED NOT DETECTED Final   Klebsiella pneumoniae NOT DETECTED NOT  DETECTED Final   Proteus species NOT DETECTED NOT DETECTED Final   Salmonella species NOT DETECTED NOT DETECTED Final   Serratia marcescens NOT DETECTED NOT DETECTED Final   Haemophilus influenzae NOT DETECTED NOT DETECTED Final   Neisseria meningitidis NOT DETECTED NOT DETECTED Final   Pseudomonas aeruginosa NOT DETECTED NOT DETECTED Final   Stenotrophomonas maltophilia NOT DETECTED NOT DETECTED Final   Candida albicans NOT DETECTED NOT DETECTED Final   Candida auris NOT DETECTED NOT DETECTED Final   Candida glabrata NOT DETECTED NOT DETECTED Final   Candida krusei NOT DETECTED NOT DETECTED Final   Candida parapsilosis NOT DETECTED NOT DETECTED Final   Candida tropicalis NOT DETECTED NOT DETECTED Final   Cryptococcus neoformans/gattii NOT DETECTED NOT DETECTED Final   Methicillin resistance mecA/C DETECTED (A) NOT DETECTED Final    Comment: CRITICAL RESULT CALLED TO, READ BACK BY AND VERIFIED WITH: PHARMD A. PAYTES [id number] @ 1941 FH Performed at [facility], [address], [place], [place] [address]   Blood Culture (routine x 2)     Status: None (Preliminary result)   Collection Time: 07/30/24 11:36 PM   Specimen: BLOOD RIGHT ARM  Result Value Ref Range Status   Specimen Description BLOOD RIGHT ARM  Final   Special Requests   Final    BOTTLES DRAWN AEROBIC ONLY Blood Culture results may not be optimal due to an inadequate volume of blood received in culture bottles   Culture  Setup Time   Final    GRAM POSITIVE COCCI IN CLUSTERS AEROBIC BOTTLE ONLY CRITICAL VALUE NOTED.  VALUE IS CONSISTENT WITH PREVIOUSLY REPORTED AND CALLED VALUE. Performed at [facility], [address] [place], [place] [address]    Culture GRAM POSITIVE COCCI  Final   Report Status PENDING  Incomplete  Resp panel by RT-PCR (RSV, Flu A&B, Covid) Anterior Nasal Swab     Status: None   Collection Time: 07/31/24  1:13 AM   Specimen: Anterior Nasal Swab  Result Value Ref Range Status   SARS  Coronavirus 2 by RT PCR NEGATIVE NEGATIVE Final   Influenza A by PCR NEGATIVE NEGATIVE Final   Influenza B by PCR NEGATIVE NEGATIVE Final    Comment: (NOTE) The Xpert Xpress SARS-CoV-2/FLU/RSV plus assay is intended as an aid in the diagnosis of influenza from Nasopharyngeal swab specimens and should not be used as a sole basis for treatment. Nasal washings and aspirates are unacceptable for Xpert Xpress SARS-CoV-2/FLU/RSV testing.  Fact Sheet for Patients: bloggercourse.com  Fact Sheet for Healthcare Providers: seriousbroker.it  This test is not yet approved or cleared by the [place]  FDA and has been authorized for detection and/or diagnosis of SARS-CoV-2 by FDA under an Emergency Use Authorization (EUA). This EUA will remain in effect (meaning this test can be used) for the duration of the COVID-19 declaration under Section 564(b)(1) of the Act, 21 U.S.C. section 360bbb-3(b)(1), unless the authorization is terminated or revoked.     Resp Syncytial Virus by PCR NEGATIVE NEGATIVE Final    Comment: (NOTE) Fact Sheet for Patients: bloggercourse.com  Fact Sheet for Healthcare Providers: seriousbroker.it  This test is not yet approved or cleared by the [place]  FDA and has been authorized for detection and/or diagnosis of SARS-CoV-2 by FDA under an Emergency Use Authorization (EUA). This EUA will remain in effect (meaning this test can be used) for the duration of the COVID-19 declaration under Section 564(b)(1) of the Act, 21 U.S.C. section 360bbb-3(b)(1), unless the authorization is terminated or revoked.  Performed at [facility], [address], [place], [place] [address]          Radiology Studies: CT Head Wo Contrast Result Date: 07/31/2024 EXAM: CT HEAD WITHOUT CONTRAST 07/31/2024 12:11:34 AM TECHNIQUE: CT of the head was performed without the  administration of intravenous contrast. Automated exposure control, iterative reconstruction, and/or weight based adjustment of the mA/kV was utilized to reduce the radiation dose to as low as reasonably achievable. COMPARISON: CT head 07/18/2023 CLINICAL HISTORY: Delirium FINDINGS: Limited study with artifact due to difficult patient positioning. BRAIN AND VENTRICLES: No acute hemorrhage. No evidence of acute infarct. No hydrocephalus. No extra-axial collection. No mass effect or midline shift. ORBITS: No acute abnormality. SINUSES: No acute abnormality. SOFT TISSUES AND SKULL: No acute soft tissue abnormality. No skull fracture. IMPRESSION: 1. Limited study without evidence of acute abnormality. Electronically signed by: Glendia Molt MD 07/31/2024 12:19 AM EST RP Workstation: [id number]   DG Chest Port 1 View Result Date: 07/30/2024 EXAM: 1 VIEW(S) XRAY OF THE CHEST 07/30/2024 11:49:32 PM COMPARISON: Chest x ray 07/18/2023. Patient's chin overlies the upper chest. CLINICAL HISTORY: Questionable sepsis - evaluate for abnormality  FINDINGS: LUNGS AND PLEURA: Mild nodular density measures 6 mm overlying the posterior Left 5th rib. No focal infiltrate. No pleural effusion. No pneumothorax. HEART AND MEDIASTINUM: No acute abnormality of the cardiac and mediastinal silhouettes. BONES AND SOFT TISSUES: There is a healed Left 5th rib fracture. IMPRESSION: 1. No acute cardiopulmonary abnormality. 2. Mild 6 mm nodular density projecting over the posterior left fifth rib, for which nonemergent chest CT is recommended for further evaluation. Electronically signed by: Greig Pique MD 07/30/2024 11:53 PM EST RP Workstation: [id number]        Scheduled Meds:  antiseptic oral rinse  15 mL Topical TID   Continuous Infusions:   LOS: 1 day       Brayton Lye, MD [facility]   To contact the attending provider between 7A-7P or the covering provider during after hours 7P-7A, please log into the web  site [email or web address] and access using universal Stella password for that web site. If you do not have the password, please call the hospital operator.  08/01/2024, 2:18 PM   "

## 2024-08-01 NOTE — Progress Notes (Signed)
 "                                                                                                                                                                                               Daily Progress Note   Patient Name: Alyssa Boyd       Date: 08/01/2024 DOB: [birth year]  Age: 84 y.o. MRN#: [id number] Attending Physician: Sherlon Brayton RAMAN, MD Primary Care Physician: Place, Orysia Admit Date: 07/30/2024  Reason for Consultation/Follow-up: Inpatient hospice referral, Non pain symptom management, Pain control, and Terminal Care  Subjective: Unresponsive, no family at bedside.   Length of Stay: 1  Current Medications: Scheduled Meds:   antiseptic oral rinse  15 mL Topical TID    Continuous Infusions:   PRN Meds: acetaminophen  **OR** acetaminophen , albuterol , artificial tears, glycopyrrolate  **OR** glycopyrrolate  **OR** glycopyrrolate , LORazepam , morphine  injection, ondansetron  (ZOFRAN ) IV  Physical Exam Constitutional:      General: She is not in acute distress.    Appearance: She is ill-appearing.     Comments: Unresponsive No signs of discomfort  Pulmonary:     Effort: Pulmonary effort is normal.  Skin:    General: Skin is warm and dry.             Vital Signs: BP (!) 102/54 (BP Location: Left Arm)   Pulse 95   Temp 99.7 F (37.6 C) (Axillary)   Resp 16   SpO2 96%  SpO2: SpO2: 96 % O2 Device: O2 Device: Room Air O2 Flow Rate: O2 Flow Rate (L/min): 5 L/min  Intake/output summary: No intake or output data in the 24 hours ending 08/01/24 1005 LBM:   Baseline Weight:   Most recent weight:         Palliative Assessment/Data: PPS 10%      Patient Active Problem List   Diagnosis Date Noted   Acute renal failure (ARF) 07/31/2024   Septic shock (HCC) 07/31/2024   Macrocytic anemia 07/31/2024   Failure to thrive in adult 07/31/2024   Hypernatremia 07/31/2024   Metabolic acidosis with increased anion gap and accumulation of organic acids 07/31/2024    Perforation bowel (HCC) 08/30/2022   Acute encephalopathy 09/21/2021   COPD (chronic obstructive pulmonary disease) (HCC)    Hyperlipidemia    AMS (altered mental status) 09/20/2021   Acute metabolic encephalopathy 09/20/2021   Parkinson disease (HCC) 09/20/2021   HTN (hypertension) 09/20/2021   Type 2 diabetes mellitus (HCC) 09/20/2021   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 female  with past medical history of Parkinson's disease admitted on 07/30/2024 with AMS in the setting of progressive diminished responsiveness over the last week. Initial systolic BPs 50s-60s, improved with low dose levophed  and fluids. Conversations were had overnight with family and decisions were made for DNR/DNI and to discontinue pressors. Sodium and creatinine elevated. COVID, influenza, RSV PCR were all negative. Chest x-ray showed no evidence of acute cardiopulmonary process. Noncontrast CT head showed no evidence of acute intracranial process. PMT consulted to help further discuss GOC.   Assessment: Follow up today. Noted that hospitalist added additional meds for comfort overnight - none utilized. Upon evaluation today patient appears comfortable - no signs of distress or discomfort. No family at bedside. Call to daughter Alyssa Boyd - continues to agree to plan to move to hospice once bed available - continue with comfort measures here while awaiting bed.  She has my number and will call with any additional concerns.  Spoke with Dale, RN - no concerns.   Recommendations/Plan: Continue full comfort measures Await hospice facility bed Continue as needed robinul , ativan  Will change morphine  to dilaudid  as patient presented with renal failure   Goals of Care and Additional Recommendations: Limitations on Scope of Treatment: Full Comfort Care  Code  Status: DNR  Prognosis:  < 2 weeks  Discharge Planning: Hospice facility  Care plan was discussed with RN, TOC, patient's daughter Alyssa Boyd  Thank you for allowing the Palliative Medicine Team to assist in the care of this patient.   I personally spent a total of 35 minutes in the care of the patient today including preparing to see the patient, performing a medically appropriate exam/evaluation, counseling and educating, placing orders, referring and communicating with other health care professionals, documenting clinical information in the EHR, and coordinating care.    *Please note that this is a verbal dictation therefore any spelling or grammatical errors are due to the Dragon Medical One system interpretation.  Tobey Jama Barnacle, DNP, AGNP-C Palliative Medicine Team Team Phone # [phone]  Pager [phone]  "

## 2024-08-01 NOTE — Plan of Care (Signed)
" °  Problem: Education: Goal: Knowledge of the prescribed therapeutic regimen will improve Outcome: Not Applicable   Problem: Coping: Goal: Ability to identify and develop effective coping behavior will improve Outcome: Not Applicable   Problem: Respiratory: Goal: Verbalizations of increased ease of respirations will increase Outcome: Not Applicable   "

## 2024-08-01 NOTE — TOC Initial Note (Signed)
 Transition of Care [facility]) - Initial/Assessment Note    Patient Details  Name: Alyssa Boyd MRN: [id number] Date of Birth: [birth year]  Transition [facility] Care [facility]) CM/SW Contact:    Inocente GORMAN Kindle, LCSW Phone Number: 08/01/2024, 8:26 AM  Clinical Narrative:                 Patient admitted from [facility]. CSW received consult for Hospice facility placement. Family's preference is [facility]. They are reviewing referral.     Expected Discharge Plan: Hospice Medical Facility Barriers to Discharge: Hospice Bed not available   Patient Goals and CMS Choice Patient states their goals for this hospitalization and ongoing recovery are:: Comfort CMS Medicare.gov Compare Post Acute Care list provided to:: Patient Represenative (must comment) Choice offered to / list presented to : Spouse, Adult Children Ward ownership interest in [facility].provided to:: Adult Children    Expected Discharge Plan and Services In-house Referral: Clinical Social Work, Hospice / Palliative Care   Post Acute Care Choice: Hospice Living arrangements for the past 2 months: Skilled Nursing Facility                                      Prior Living Arrangements/Services Living arrangements for the past 2 months: Skilled Nursing Facility Lives with:: Facility Resident Patient language and need for interpreter reviewed:: Yes Do you feel safe going back to the place where you live?: Yes      Need for Family Participation in Patient Care: Yes (Comment) Care giver support system in place?: Yes (comment)   Criminal Activity/Legal Involvement Pertinent to Current Situation/Hospitalization: No - Comment as needed  Activities of Daily Living      Permission Sought/Granted Permission sought to share information with : Facility Medical Sales Representative, Family Supports    Share Information with NAME: PETROVITCH,JENNY -Daughter   [phone]  Permission granted to  share info w AGENCY: Hospice        Emotional Assessment Appearance:: Appears stated age Attitude/Demeanor/Rapport: Unable to Assess Affect (typically observed): Unable to Assess Orientation: :  (Disoriented x4) Alcohol  / Substance Use: Not Applicable Psych Involvement: No (comment)  Admission diagnosis:  Acute renal failure (ARF) [N17.9] Acute renal failure, unspecified acute renal failure type [N17.9] Altered mental status, unspecified altered mental status type [R41.82] Acute metabolic encephalopathy [G93.41] Patient Active Problem List   Diagnosis Date Noted   Acute renal failure (ARF) 07/31/2024   Septic shock (HCC) 07/31/2024   Macrocytic anemia 07/31/2024   Failure to thrive in adult 07/31/2024   Hypernatremia 07/31/2024   Metabolic acidosis with increased anion gap and accumulation of organic acids 07/31/2024   Perforation bowel (HCC) 08/30/2022   Acute encephalopathy 09/21/2021   COPD (chronic obstructive pulmonary disease) (HCC)    Hyperlipidemia    AMS (altered mental status) 09/20/2021   Acute metabolic encephalopathy 09/20/2021   Parkinson disease (HCC) 09/20/2021   HTN (hypertension) 09/20/2021   Type 2 diabetes mellitus (HCC) 09/20/2021   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   Foot fracture, left, sequela 09/20/2021   Hypokalemia 09/20/2021   Hypomagnesemia 09/20/2021   PCP:  [facility]:   Juliane Karenann [facility] [place], [place] - [address] [place] [address] Phone: [phone] Fax: [phone]     Social Drivers of Health (SDOH) Social History: SDOH Screenings   Food Insecurity: Unknown (08/01/2024)  Housing:  Patient Unable To Answer (08/01/2024)  Transportation Needs: Patient Unable To Answer (08/01/2024)  Utilities: Patient Unable To Answer (08/01/2024)  Social Connections: Unknown (08/01/2024)  Tobacco Use: Low Risk (10/29/2023)   Received from [facility]   SDOH Interventions:     Readmission Risk  Interventions     No data to display

## 2024-08-02 DIAGNOSIS — Z515 Encounter for palliative care: Secondary | ICD-10-CM

## 2024-08-02 MED ORDER — ONDANSETRON HCL 4 MG/2ML IJ SOLN: 4.0000 mg | Freq: Four times a day (QID) | INTRAMUSCULAR | Status: AC | PRN

## 2024-08-02 MED ORDER — HYDROMORPHONE HCL 1 MG/ML IJ SOLN: 0.5000 mg | INTRAMUSCULAR | Status: AC | PRN

## 2024-08-02 MED ORDER — LORAZEPAM 2 MG/ML IJ SOLN: 0.5000 mg | Freq: Four times a day (QID) | INTRAMUSCULAR | Status: AC | PRN

## 2024-08-02 MED ORDER — GLYCOPYRROLATE 1 MG PO TABS: 1.0000 mg | ORAL_TABLET | ORAL | Status: AC | PRN

## 2024-08-02 MED ORDER — ACETAMINOPHEN 325 MG PO TABS: 650.0000 mg | ORAL_TABLET | Freq: Four times a day (QID) | ORAL | Status: AC | PRN

## 2024-08-02 NOTE — Progress Notes (Signed)
 "                                                                                                                                                                                               Daily Progress Note   Patient Name: Alyssa Boyd       Date: 08/02/2024 DOB: [birth year]  Age: 84 y.o. MRN#: [id number] Attending Physician: Sherlon Brayton RAMAN, MD Primary Care Physician: Place, [facility] Admit Date: 07/30/2024  Reason for Consultation/Follow-up: Inpatient hospice referral, Non pain symptom management, Pain control, and Terminal Care  Subjective: Unresponsive, no family at bedside.   Length of Stay: 2  Current Medications: Scheduled Meds:   antiseptic oral rinse  15 mL Topical TID    Continuous Infusions:   PRN Meds: acetaminophen  **OR** acetaminophen , albuterol , artificial tears, glycopyrrolate  **OR** glycopyrrolate  **OR** glycopyrrolate , HYDROmorphone  (DILAUDID ) injection, LORazepam , ondansetron  (ZOFRAN ) IV  Physical Exam Constitutional:      General: She is not in acute distress.    Appearance: She is ill-appearing.     Comments: Unresponsive No signs of discomfort  Pulmonary:     Effort: Pulmonary effort is normal.  Skin:    General: Skin is warm and dry.             Vital Signs: BP (!) 106/58 (BP Location: Left Arm)   Pulse (!) 111   Temp 99.6 F (37.6 C) (Axillary)   Resp 18   SpO2 96%  SpO2: SpO2: 96 % O2 Device: O2 Device: Room Air O2 Flow Rate: O2 Flow Rate (L/min): 5 L/min  Intake/output summary: No intake or output data in the 24 hours ending 08/02/24 1115 LBM:   Baseline Weight:   Most recent weight:         Palliative Assessment/Data: PPS 10%      Patient Active Problem List   Diagnosis Date Noted   Acute renal failure (ARF) 07/31/2024   Septic shock (HCC) 07/31/2024   Macrocytic anemia 07/31/2024   Failure to thrive in adult 07/31/2024   Hypernatremia 07/31/2024   Metabolic acidosis with increased anion gap and accumulation of organic  acids 07/31/2024   Perforation bowel (HCC) 08/30/2022   Acute encephalopathy 09/21/2021   COPD (chronic obstructive pulmonary disease) (HCC)    Hyperlipidemia    AMS (altered mental status) 09/20/2021   Acute metabolic encephalopathy 09/20/2021   Parkinson disease (HCC) 09/20/2021   HTN (hypertension) 09/20/2021   Type 2 diabetes mellitus (HCC) 09/20/2021   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 female  with past medical history of Parkinson's disease admitted on 07/30/2024 with AMS in the setting of progressive diminished responsiveness over the last week. Initial systolic BPs 50s-60s, improved with low dose levophed  and fluids. Conversations were had overnight with family and decisions were made for DNR/DNI and to discontinue pressors. Sodium and creatinine elevated. COVID, influenza, RSV PCR were all negative. Chest x-ray showed no evidence of acute cardiopulmonary process. Noncontrast CT head showed no evidence of acute intracranial process. PMT consulted to help further discuss GOC.   Assessment: Follow up today. Has not needed comfort medications - remains essentially unresponsive.  Upon evaluation today patient appears comfortable - no signs of distress or discomfort. No family at bedside. Call to daughter [name]  - update for today provided to her - continues to agree to plan to move to hospice once bed available - continue with comfort measures here while awaiting bed.  She has my number and will call with any additional concerns.  Recommendations/Plan: Continue full comfort measures Await hospice facility bed Continue as needed robinul , ativan , and dilaudid  - avoid morphine  in renal failure   Goals of Care and Additional Recommendations: Limitations on Scope of Treatment: Full Comfort Care  Code Status: DNR  Prognosis:  <  2 weeks  Discharge Planning: Hospice facility  Care plan was discussed with patient's daughter [name]  Thank you for allowing the Palliative Medicine Team to assist in the care of this patient.   I personally spent a total of 25 minutes in the care of the patient today including preparing to see the patient, performing a medically appropriate exam/evaluation, counseling and educating, documenting clinical information in the EHR, and coordinating care.    *Please note that this is a verbal dictation therefore any spelling or grammatical errors are due to the Dragon Medical One system interpretation.  Tobey Jama Barnacle, DNP, AGNP-C Palliative Medicine Team Team Phone # [phone]  Pager [phone]  "

## 2024-08-02 NOTE — TOC Transition Note (Signed)
 Transition of Care [facility]) - Discharge Note   Patient Details  Name: Alyssa Boyd MRN: [id number] Date of Birth: 11-11-1940  Transition [facility]) CM/SW Contact:  Alyssa Boyd [place], [place] Phone Number: 08/02/2024, 12:23 PM   Clinical Narrative:    Phone call from [place], hospice liaison for [facility]. Patient has been approved for admission to [facility] [place]. Patient's daughter contacted, she will need to sign consents before patient is admitted. Hospice liaison confirms that she has sent the consent forms to daughter's email to be signed and returned.  Patient to be transported by [facility] to [address] [place], [place] [address]. RN to call report to [phone].   Alyssa Hermida, LCSW Transition of Care       Barriers to Discharge: Hospice Bed not available   Patient Goals and CMS Choice Patient states their goals for this hospitalization and ongoing recovery are:: Comfort CMS Medicare.gov Compare Post Acute Care list provided to:: Patient Represenative (must comment) Choice offered to / list presented to : Spouse, Adult Children [place] ownership interest in [facility].provided to:: Adult Children    Discharge Placement                       Discharge Plan and Services Additional resources added to the After Visit Summary for   In-house Referral: Clinical Social Work, Hospice / Palliative Care   Post Acute Care Choice: Hospice                               Social Drivers of Health (SDOH) Interventions SDOH Screenings   Food Insecurity: Unknown (08/01/2024)  Housing: Patient Unable To Answer (08/01/2024)  Transportation Needs: Patient Unable To Answer (08/01/2024)  Utilities: Patient Unable To Answer (08/01/2024)  Social Connections: Unknown (08/01/2024)  Tobacco Use: Low Risk (10/29/2023)   Received from [facility]     Readmission Risk Interventions     No data to display

## 2024-08-02 NOTE — Plan of Care (Signed)
  Problem: Clinical Measurements: Goal: Quality of life will improve Outcome: Progressing   Problem: Role Relationship: Goal: Family's ability to cope with current situation will improve Outcome: Progressing Goal: Ability to verbalize concerns, feelings, and thoughts to partner or family member will improve Outcome: Progressing   Problem: Pain Management: Goal: Satisfaction with pain management regimen will improve Outcome: Progressing

## 2024-08-02 NOTE — Discharge Summary (Signed)
 Physician Discharge Summary  Alyssa Boyd FMW:[id number] DOB: 04-04-1941 DOA: 07/30/2024  PCP: [name]  Admit date: 07/30/2024 Discharge date: 08/02/2024   Disposition:  [facility] residential Hospice)  Recommendations for Outpatient Follow-up:  Comfort care patient residential hospice  CODE STATUS: (DNR, Comfort Care)   Brief/Interim Summary:   Alyssa Boyd is a 84 y.o. 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  Patient was unable to provide any history.   - Her workup significant for severe hyponatremia, hypotension, hypoxia, patient confirmed DNR/DNI with no escalation of care, palliative medicine consulted with decision made to proceed with full comfort measures   Septic shock  Acute renal failure Dehydration Hypernatremia Metabolic acidosis with elevated anion gap Acute metabolic encephalopathy Parkinson dementia Macrocytic anemia Failure to thrive Parkinson's disease End-of-life care     Palliative medicine input greatly appreciated, currently patient with full comfort measures, she appears comfortable on current regimen, continue with current care,  hospice facility bed available today.   Discharge Diagnoses:  Principal Problem:   Acute renal failure (ARF) Active Problems:   Septic shock (HCC)   Hypernatremia   Metabolic acidosis with increased anion gap and accumulation of organic acids   Acute metabolic encephalopathy   Macrocytic anemia   Parkinson disease (HCC)   Failure to thrive in adult   Hospice care patient    Discharge Instructions   Allergies as of 08/02/2024       Reactions   Baclofen Anaphylaxis   Cefaclor Anaphylaxis, Hives, Itching   Shellfish  Allergy Hives, Itching, Nausea Only        Medication List     STOP taking these medications    albuterol  108 (90 Base) MCG/ACT inhaler Commonly known as: VENTOLIN  HFA   aspirin  81 MG chewable tablet   atorvastatin  20 MG tablet Commonly known as: LIPITOR   Biofreeze Cool The Pain 4 % Gel Generic drug: Menthol (Topical Analgesic)   bisacodyl 5 MG EC tablet Commonly known as: DULCOLAX   calcium  carbonate 1250 (500 Ca) MG tablet Commonly known as: OS-CAL - dosed in mg of elemental calcium    carbidopa -levodopa  25-100 MG tablet Commonly known as: SINEMET  IR   dextrose  5 % (Impella PURGE) solution 1000 mL bag 5 %   escitalopram 10 MG tablet Commonly known as: LEXAPRO   famotidine 20 MG tablet Commonly known as: PEPCID   fluticasone  50 MCG/ACT nasal spray Commonly known as: FLONASE    fluticasone -salmeterol 250-50 MCG/ACT Aepb Commonly known as: ADVAIR   gabapentin 100 MG capsule Commonly known as: NEURONTIN   lidocaine  4 %   loratadine 10 MG tablet Commonly known as: CLARITIN   LORazepam  0.5 MG tablet Commonly known as: ATIVAN  Replaced by: LORazepam  2 MG/ML injection   memantine 5 MG tablet Commonly known as: NAMENDA   methocarbamol  500 MG tablet Commonly known as: ROBAXIN    metoprolol  tartrate 50 MG tablet Commonly known as: LOPRESSOR    mirtazapine 7.5 MG tablet Commonly known as: REMERON   montelukast  10 MG tablet Commonly known as: SINGULAIR    MULTI COMPLETE PO   nutrition supplement (JUVEN) Pack   polyethylene glycol 17 g packet Commonly known as: MIRALAX  / GLYCOLAX    sennosides-docusate sodium  8.6-50 MG tablet Commonly known as: SENOKOT-S   simethicone 80 MG chewable tablet Commonly known as: MYLICON   telmisartan  40 MG tablet Commonly known as: MICARDIS   traMADol  50 MG tablet Commonly known as: ULTRAM    Vitamin D (Ergocalciferol) 1.25 MG (50000 UNIT) Caps capsule Commonly known as: DRISDOL       TAKE these medications     acetaminophen  325 MG tablet Commonly known as: TYLENOL  Take 2 tablets (650 mg total) by mouth every 6 (six) hours as needed for mild pain (pain score 1-3) (or Fever >/= 101).   glycopyrrolate  1 MG tablet Commonly known as: ROBINUL  Take 1 tablet (1 mg total) by mouth every 4 (four) hours as needed (excessive secretions).   HYDROmorphone  1 MG/ML injection Commonly known as: DILAUDID  Inject 0.5 mLs (0.5 mg total) into the vein every 2 (two) hours as needed for severe pain (pain score 7-10) (dyspnea).   LORazepam  2 MG/ML injection Commonly known as: ATIVAN  Inject 0.25 mLs (0.5 mg total) into the vein every 6 (six) hours as needed for anxiety or sedation. Replaces: LORazepam  0.5 MG tablet   ondansetron  4 MG/2ML Soln injection Commonly known as: ZOFRAN  Inject 2 mLs (4 mg total) into the vein every 6 (six) hours as needed for nausea or vomiting.        Allergies[1]  Consultations: Palliative medicine   Procedures/Studies: CT Head Wo Contrast Result Date: 07/31/2024 EXAM: CT HEAD WITHOUT CONTRAST 07/31/2024 12:11:34 AM TECHNIQUE: CT of the head was performed without the administration of intravenous contrast. Automated exposure control, iterative reconstruction, and/or weight based adjustment of the mA/kV was utilized to reduce the radiation dose to as low as reasonably achievable. COMPARISON: CT head 07/18/2023 CLINICAL HISTORY: Delirium FINDINGS: Limited study with artifact due to difficult patient positioning. BRAIN AND VENTRICLES: No acute hemorrhage. No evidence of acute infarct. No hydrocephalus. No extra-axial collection. No mass effect or midline shift. ORBITS: No acute abnormality. SINUSES: No acute abnormality. SOFT TISSUES AND SKULL: No acute soft tissue abnormality. No skull fracture. IMPRESSION: 1. Limited study without evidence of acute abnormality. Electronically signed by: Glendia Molt MD 07/31/2024 12:19 AM EST RP Workstation: [id number]   DG Chest Port 1 View Result  Date: 07/30/2024 EXAM: 1 VIEW(S) XRAY OF THE CHEST 07/30/2024 11:49:32 PM COMPARISON: Chest x ray 07/18/2023. Patient's chin overlies the upper chest. CLINICAL HISTORY: Questionable sepsis - evaluate for abnormality FINDINGS: LUNGS AND PLEURA: Mild nodular density measures 6 mm overlying the posterior Left 5th rib. No focal infiltrate. No pleural effusion. No pneumothorax. HEART AND MEDIASTINUM: No acute abnormality of the cardiac and mediastinal silhouettes. BONES AND SOFT TISSUES: There is a healed Left 5th rib fracture. IMPRESSION: 1. No acute cardiopulmonary abnormality. 2. Mild 6 mm nodular density projecting over the posterior left fifth rib, for which nonemergent chest CT is recommended for further evaluation. Electronically signed by: Greig Pique MD 07/30/2024 11:53 PM EST RP Workstation: [id number]      Subjective:  Patient is unresponsive cannot provide any complaints, she appears comfortable, no significant events as discussed with staff Discharge Exam: Vitals:   08/01/24 1207 08/01/24 2000  BP: (!) 109/54 (!) 106/58  Pulse: 79 (!) 111  Resp: 18   Temp: (!) 100.4 F (38 C) 99.6 F (37.6 C)  SpO2:     Vitals:   07/31/24 2036 08/01/24 0758 08/01/24 1207 08/01/24 2000  BP:  (!) 102/54 (!) 109/54 (!) 106/58  Pulse:   79 (!) 111  Resp:  16 18   Temp: 97.6 F (36.4 C) 99.7 F (37.6 C) (!) 100.4 F (38 C) 99.6 F (37.6 C)  TempSrc: Axillary Axillary Axillary  Axillary  SpO2:        Patient laying in bed, unresponsive appears comfortable, does not follow any commands or answer any questions, skin warm    The results of significant diagnostics from this hospitalization (including imaging, microbiology, ancillary and laboratory) are listed below for reference.     Microbiology: Recent Results (from the past 240 hours)  Blood Culture (routine x 2)     Status: Abnormal (Preliminary result)   Collection Time: 07/30/24 11:31 PM   Specimen: BLOOD RIGHT ARM  Result Value Ref  Range Status   Specimen Description BLOOD RIGHT ARM  Final   Special Requests   Final    BOTTLES DRAWN AEROBIC AND ANAEROBIC Blood Culture adequate volume   Culture  Setup Time   Final    GRAM POSITIVE COCCI IN BOTH AEROBIC AND ANAEROBIC BOTTLES CRITICAL RESULT CALLED TO, READ BACK BY AND VERIFIED WITH: PHARMD A. PAYTES [id number] @ 1941 FH    Culture (A)  Final    STAPHYLOCOCCUS HOMINIS STAPHYLOCOCCUS CAPITIS CULTURE REINCUBATED FOR BETTER GROWTH Performed at [facility], [address], [place], [place] [address]    Report Status PENDING  Incomplete  Blood Culture ID Panel (Reflexed)     Status: Abnormal   Collection Time: 07/30/24 11:31 PM  Result Value Ref Range Status   Enterococcus faecalis NOT DETECTED NOT DETECTED Final   Enterococcus Faecium NOT DETECTED NOT DETECTED Final   Listeria monocytogenes NOT DETECTED NOT DETECTED Final   Staphylococcus species DETECTED (A) NOT DETECTED Final    Comment: CRITICAL RESULT CALLED TO, READ BACK BY AND VERIFIED WITH: PHARMD A. PAYTES [id number] @ 1940 FH    Staphylococcus aureus (BCID) NOT DETECTED NOT DETECTED Final   Staphylococcus epidermidis DETECTED (A) NOT DETECTED Final    Comment: Methicillin (oxacillin) resistant coagulase negative staphylococcus. Possible blood culture contaminant (unless isolated from more than one blood culture draw or clinical case suggests pathogenicity). No antibiotic treatment is indicated for blood  culture contaminants. CRITICAL RESULT CALLED TO, READ BACK BY AND VERIFIED WITH: PHARMD A. PAYTES [id number] @ 1940 FH    Staphylococcus lugdunensis NOT DETECTED NOT DETECTED Final   Streptococcus species NOT DETECTED NOT DETECTED Final   Streptococcus agalactiae NOT DETECTED NOT DETECTED Final   Streptococcus pneumoniae NOT DETECTED NOT DETECTED Final   Streptococcus pyogenes NOT DETECTED NOT DETECTED Final   A.calcoaceticus-baumannii NOT DETECTED NOT DETECTED Final   Bacteroides fragilis NOT DETECTED NOT  DETECTED Final   Enterobacterales NOT DETECTED NOT DETECTED Final   Enterobacter cloacae complex NOT DETECTED NOT DETECTED Final   Escherichia coli NOT DETECTED NOT DETECTED Final   Klebsiella aerogenes NOT DETECTED NOT DETECTED Final   Klebsiella oxytoca NOT DETECTED NOT DETECTED Final   Klebsiella pneumoniae NOT DETECTED NOT DETECTED Final   Proteus species NOT DETECTED NOT DETECTED Final   Salmonella species NOT DETECTED NOT DETECTED Final   Serratia marcescens NOT DETECTED NOT DETECTED Final   Haemophilus influenzae NOT DETECTED NOT DETECTED Final   Neisseria meningitidis NOT DETECTED NOT DETECTED Final   Pseudomonas aeruginosa NOT DETECTED NOT DETECTED Final   Stenotrophomonas maltophilia NOT DETECTED NOT DETECTED Final   Candida albicans NOT DETECTED NOT DETECTED Final   Candida auris NOT DETECTED NOT DETECTED Final   Candida glabrata NOT DETECTED NOT DETECTED Final   Candida krusei NOT DETECTED NOT DETECTED Final   Candida parapsilosis NOT DETECTED NOT DETECTED Final   Candida tropicalis NOT DETECTED NOT DETECTED Final   Cryptococcus neoformans/gattii NOT DETECTED  NOT DETECTED Final   Methicillin resistance mecA/C DETECTED (A) NOT DETECTED Final    Comment: CRITICAL RESULT CALLED TO, READ BACK BY AND VERIFIED WITH: PHARMD A. PAYTES [id number] @ 1940 FH Performed at [facility], [address], [place], [place] [address]   Blood Culture (routine x 2)     Status: None (Preliminary result)   Collection Time: 07/30/24 11:36 PM   Specimen: BLOOD RIGHT ARM  Result Value Ref Range Status   Specimen Description BLOOD RIGHT ARM  Final   Special Requests   Final    BOTTLES DRAWN AEROBIC ONLY Blood Culture results may not be optimal due to an inadequate volume of blood received in culture bottles   Culture  Setup Time   Final    GRAM POSITIVE COCCI IN CLUSTERS AEROBIC BOTTLE ONLY CRITICAL VALUE NOTED.  VALUE IS CONSISTENT WITH PREVIOUSLY REPORTED AND CALLED VALUE.    Culture    Final    GRAM POSITIVE COCCI IDENTIFICATION TO FOLLOW Performed at [facility], [address], [place], [place] [address]    Report Status PENDING  Incomplete  Resp panel by RT-PCR (RSV, Flu A&B, Covid) Anterior Nasal Swab     Status: None   Collection Time: 07/31/24  1:13 AM   Specimen: Anterior Nasal Swab  Result Value Ref Range Status   SARS Coronavirus 2 by RT PCR NEGATIVE NEGATIVE Final   Influenza A by PCR NEGATIVE NEGATIVE Final   Influenza B by PCR NEGATIVE NEGATIVE Final    Comment: (NOTE) The Xpert Xpress SARS-CoV-2/FLU/RSV plus assay is intended as an aid in the diagnosis of influenza from Nasopharyngeal swab specimens and should not be used as a sole basis for treatment. Nasal washings and aspirates are unacceptable for Xpert Xpress SARS-CoV-2/FLU/RSV testing.  Fact Sheet for Patients: bloggercourse.com  Fact Sheet for Healthcare Providers: seriousbroker.it  This test is not yet approved or cleared by the [place]  FDA and has been authorized for detection and/or diagnosis of SARS-CoV-2 by FDA under an Emergency Use Authorization (EUA). This EUA will remain in effect (meaning this test can be used) for the duration of the COVID-19 declaration under Section 564(b)(1) of the Act, 21 U.S.C. section 360bbb-3(b)(1), unless the authorization is terminated or revoked.     Resp Syncytial Virus by PCR NEGATIVE NEGATIVE Final    Comment: (NOTE) Fact Sheet for Patients: bloggercourse.com  Fact Sheet for Healthcare Providers: seriousbroker.it  This test is not yet approved or cleared by the [place]  FDA and has been authorized for detection and/or diagnosis of SARS-CoV-2 by FDA under an Emergency Use Authorization (EUA). This EUA will remain in effect (meaning this test can be used) for the duration of the COVID-19 declaration under Section 564(b)(1) of the  Act, 21 U.S.C. section 360bbb-3(b)(1), unless the authorization is terminated or revoked.  Performed at [facility], [address], [place], [place] [address]      Labs: BNP (last 3 results) No results for input(s): BNP in the last 8760 hours. Basic Metabolic Panel: Recent Labs  Lab 07/30/24 2322 07/31/24 0300  NA 159* 160*  K 4.1 3.7  CL 117* 120*  CO2 23 25  GLUCOSE 163* 153*  BUN 106* 98*  CREATININE 3.81* 3.08*  CALCIUM  9.7 8.6*  MG  --  2.1   Liver Function Tests: Recent Labs  Lab 07/30/24 2322 07/31/24 0300  AST 37 30  ALT 9 6  ALKPHOS 79 63  BILITOT 0.4 0.3  PROT  6.9 5.4*  ALBUMIN  3.3* 2.7*   No results for input(s): LIPASE, AMYLASE in the last 168 hours. No results for input(s): AMMONIA in the last 168 hours. CBC: Recent Labs  Lab 07/30/24 2322 07/31/24 0521  WBC 13.6* 13.5*  NEUTROABS 10.5* 10.6*  HGB 13.0 10.9*  HCT 42.7 35.0*  MCV 106.2* 102.6*  PLT 268 197   Cardiac Enzymes: No results for input(s): CKTOTAL, CKMB, CKMBINDEX, TROPONINI in the last 168 hours. BNP: Invalid input(s): POCBNP CBG: No results for input(s): GLUCAP in the last 168 hours. D-Dimer No results for input(s): DDIMER in the last 72 hours. Hgb A1c No results for input(s): HGBA1C in the last 72 hours. Lipid Profile No results for input(s): CHOL, HDL, LDLCALC, TRIG, CHOLHDL, LDLDIRECT in the last 72 hours. Thyroid function studies No results for input(s): TSH, T4TOTAL, T3FREE, THYROIDAB in the last 72 hours.  Invalid input(s): FREET3 Anemia work up No results for input(s): VITAMINB12, FOLATE, FERRITIN, TIBC, IRON, RETICCTPCT in the last 72 hours. Urinalysis    Component Value Date/Time   COLORURINE YELLOW 07/18/2023 1743   APPEARANCEUR HAZY (A) 07/18/2023 1743   LABSPEC 1.013 07/18/2023 1743   PHURINE 6.0 07/18/2023 1743   GLUCOSEU NEGATIVE 07/18/2023 1743   HGBUR MODERATE (A) 07/18/2023 1743    BILIRUBINUR NEGATIVE 07/18/2023 1743   KETONESUR NEGATIVE 07/18/2023 1743   PROTEINUR NEGATIVE 07/18/2023 1743   NITRITE POSITIVE (A) 07/18/2023 1743   LEUKOCYTESUR TRACE (A) 07/18/2023 1743   Sepsis Labs Recent Labs  Lab 07/30/24 2322 07/31/24 0521  WBC 13.6* 13.5*   Microbiology Recent Results (from the past 240 hours)  Blood Culture (routine x 2)     Status: Abnormal (Preliminary result)   Collection Time: 07/30/24 11:31 PM   Specimen: BLOOD RIGHT ARM  Result Value Ref Range Status   Specimen Description BLOOD RIGHT ARM  Final   Special Requests   Final    BOTTLES DRAWN AEROBIC AND ANAEROBIC Blood Culture adequate volume   Culture  Setup Time   Final    GRAM POSITIVE COCCI IN BOTH AEROBIC AND ANAEROBIC BOTTLES CRITICAL RESULT CALLED TO, READ BACK BY AND VERIFIED WITH: PHARMD A. PAYTES [id number] @ 1940 FH    Culture (A)  Final    STAPHYLOCOCCUS HOMINIS STAPHYLOCOCCUS CAPITIS CULTURE REINCUBATED FOR BETTER GROWTH Performed at [facility], [address], [place], [place] [address]    Report Status PENDING  Incomplete  Blood Culture ID Panel (Reflexed)     Status: Abnormal   Collection Time: 07/30/24 11:31 PM  Result Value Ref Range Status   Enterococcus faecalis NOT DETECTED NOT DETECTED Final   Enterococcus Faecium NOT DETECTED NOT DETECTED Final   Listeria monocytogenes NOT DETECTED NOT DETECTED Final   Staphylococcus species DETECTED (A) NOT DETECTED Final    Comment: CRITICAL RESULT CALLED TO, READ BACK BY AND VERIFIED WITH: PHARMD A. PAYTES [id number] @ 1940 FH    Staphylococcus aureus (BCID) NOT DETECTED NOT DETECTED Final   Staphylococcus epidermidis DETECTED (A) NOT DETECTED Final    Comment: Methicillin (oxacillin) resistant coagulase negative staphylococcus. Possible blood culture contaminant (unless isolated from more than one blood culture draw or clinical case suggests pathogenicity). No antibiotic treatment is indicated for blood  culture  contaminants. CRITICAL RESULT CALLED TO, READ BACK BY AND VERIFIED WITH: PHARMD A. PAYTES [id number] @ 1940 FH    Staphylococcus lugdunensis NOT DETECTED NOT DETECTED Final   Streptococcus species NOT DETECTED NOT DETECTED Final   Streptococcus agalactiae NOT DETECTED NOT DETECTED  Final   Streptococcus pneumoniae NOT DETECTED NOT DETECTED Final   Streptococcus pyogenes NOT DETECTED NOT DETECTED Final   A.calcoaceticus-baumannii NOT DETECTED NOT DETECTED Final   Bacteroides fragilis NOT DETECTED NOT DETECTED Final   Enterobacterales NOT DETECTED NOT DETECTED Final   Enterobacter cloacae complex NOT DETECTED NOT DETECTED Final   Escherichia coli NOT DETECTED NOT DETECTED Final   Klebsiella aerogenes NOT DETECTED NOT DETECTED Final   Klebsiella oxytoca NOT DETECTED NOT DETECTED Final   Klebsiella pneumoniae NOT DETECTED NOT DETECTED Final   Proteus species NOT DETECTED NOT DETECTED Final   Salmonella species NOT DETECTED NOT DETECTED Final   Serratia marcescens NOT DETECTED NOT DETECTED Final   Haemophilus influenzae NOT DETECTED NOT DETECTED Final   Neisseria meningitidis NOT DETECTED NOT DETECTED Final   Pseudomonas aeruginosa NOT DETECTED NOT DETECTED Final   Stenotrophomonas maltophilia NOT DETECTED NOT DETECTED Final   Candida albicans NOT DETECTED NOT DETECTED Final   Candida auris NOT DETECTED NOT DETECTED Final   Candida glabrata NOT DETECTED NOT DETECTED Final   Candida krusei NOT DETECTED NOT DETECTED Final   Candida parapsilosis NOT DETECTED NOT DETECTED Final   Candida tropicalis NOT DETECTED NOT DETECTED Final   Cryptococcus neoformans/gattii NOT DETECTED NOT DETECTED Final   Methicillin resistance mecA/C DETECTED (A) NOT DETECTED Final    Comment: CRITICAL RESULT CALLED TO, READ BACK BY AND VERIFIED WITH: PHARMD A. PAYTES [id number] @ 1940 FH Performed at [facility], [address], [place], [place] [address]   Blood Culture (routine x 2)     Status: None (Preliminary  result)   Collection Time: 07/30/24 11:36 PM   Specimen: BLOOD RIGHT ARM  Result Value Ref Range Status   Specimen Description BLOOD RIGHT ARM  Final   Special Requests   Final    BOTTLES DRAWN AEROBIC ONLY Blood Culture results may not be optimal due to an inadequate volume of blood received in culture bottles   Culture  Setup Time   Final    GRAM POSITIVE COCCI IN CLUSTERS AEROBIC BOTTLE ONLY CRITICAL VALUE NOTED.  VALUE IS CONSISTENT WITH PREVIOUSLY REPORTED AND CALLED VALUE.    Culture   Final    GRAM POSITIVE COCCI IDENTIFICATION TO FOLLOW Performed at [facility], [address] [place], [place] [address]    Report Status PENDING  Incomplete  Resp panel by RT-PCR (RSV, Flu A&B, Covid) Anterior Nasal Swab     Status: None   Collection Time: 07/31/24  1:13 AM   Specimen: Anterior Nasal Swab  Result Value Ref Range Status   SARS Coronavirus 2 by RT PCR NEGATIVE NEGATIVE Final   Influenza A by PCR NEGATIVE NEGATIVE Final   Influenza B by PCR NEGATIVE NEGATIVE Final    Comment: (NOTE) The Xpert Xpress SARS-CoV-2/FLU/RSV plus assay is intended as an aid in the diagnosis of influenza from Nasopharyngeal swab specimens and should not be used as a sole basis for treatment. Nasal washings and aspirates are unacceptable for Xpert Xpress SARS-CoV-2/FLU/RSV testing.  Fact Sheet for Patients: bloggercourse.com  Fact Sheet for Healthcare Providers: seriousbroker.it  This test is not yet approved or cleared by the [place]  FDA and has been authorized for detection and/or diagnosis of SARS-CoV-2 by FDA under an Emergency Use Authorization (EUA). This EUA will remain in effect (meaning this test can be used) for the duration of the COVID-19 declaration under Section 564(b)(1) of the Act, 21 U.S.C. section 360bbb-3(b)(1), unless the authorization is terminated or  revoked.     Resp Syncytial Virus by PCR NEGATIVE NEGATIVE  Final    Comment: (NOTE) Fact Sheet for Patients: bloggercourse.com  Fact Sheet for Healthcare Providers: seriousbroker.it  This test is not yet approved or cleared by the [place]  FDA and has been authorized for detection and/or diagnosis of SARS-CoV-2 by FDA under an Emergency Use Authorization (EUA). This EUA will remain in effect (meaning this test can be used) for the duration of the COVID-19 declaration under Section 564(b)(1) of the Act, 21 U.S.C. section 360bbb-3(b)(1), unless the authorization is terminated or revoked.  Performed at [facility], [address], [place], [place] [address]      Time coordinating discharge: 25 minutes  SIGNED:   Brayton Lye, MD  [facility] 08/02/2024, 12:20 PM Pager   If 7PM-7AM, please contact night-coverage [email or web address]     [1]  Allergies Allergen Reactions   Baclofen Anaphylaxis   Cefaclor Anaphylaxis, Hives and Itching   Shellfish Allergy Hives, Itching and Nausea Only

## 2024-08-03 LAB — CULTURE, BLOOD (ROUTINE X 2): Special Requests: ADEQUATE

## 2024-08-04 LAB — CULTURE, BLOOD (ROUTINE X 2)

## 2024-08-10 DEATH — deceased
